# Patient Record
Sex: Female | Born: 1955 | Race: Black or African American | Hispanic: No | Marital: Single | Smoking: Never smoker
Health system: Southern US, Community
[De-identification: ages and names within clinical notes are randomized; demographics above are authoritative.]

## PROBLEM LIST (undated history)

## (undated) DIAGNOSIS — N2581 Secondary hyperparathyroidism of renal origin: Secondary | ICD-10-CM

## (undated) DIAGNOSIS — M1710 Unilateral primary osteoarthritis, unspecified knee: Secondary | ICD-10-CM

## (undated) DIAGNOSIS — R809 Proteinuria, unspecified: Secondary | ICD-10-CM

## (undated) DIAGNOSIS — N289 Disorder of kidney and ureter, unspecified: Secondary | ICD-10-CM

## (undated) DIAGNOSIS — Z992 Dependence on renal dialysis: Secondary | ICD-10-CM

## (undated) DIAGNOSIS — M1990 Unspecified osteoarthritis, unspecified site: Secondary | ICD-10-CM

## (undated) DIAGNOSIS — D631 Anemia in chronic kidney disease: Secondary | ICD-10-CM

## (undated) DIAGNOSIS — M2140 Flat foot [pes planus] (acquired), unspecified foot: Secondary | ICD-10-CM

## (undated) DIAGNOSIS — R7989 Other specified abnormal findings of blood chemistry: Secondary | ICD-10-CM

## (undated) DIAGNOSIS — M7062 Trochanteric bursitis, left hip: Secondary | ICD-10-CM

## (undated) DIAGNOSIS — E785 Hyperlipidemia, unspecified: Secondary | ICD-10-CM

## (undated) DIAGNOSIS — Z8614 Personal history of Methicillin resistant Staphylococcus aureus infection: Secondary | ICD-10-CM

## (undated) DIAGNOSIS — N186 End stage renal disease: Secondary | ICD-10-CM

## (undated) DIAGNOSIS — A4150 Gram-negative sepsis, unspecified: Secondary | ICD-10-CM

## (undated) DIAGNOSIS — G4730 Sleep apnea, unspecified: Secondary | ICD-10-CM

## (undated) DIAGNOSIS — E871 Hypo-osmolality and hyponatremia: Secondary | ICD-10-CM

## (undated) DIAGNOSIS — E559 Vitamin D deficiency, unspecified: Secondary | ICD-10-CM

## (undated) DIAGNOSIS — R011 Cardiac murmur, unspecified: Secondary | ICD-10-CM

## (undated) DIAGNOSIS — E119 Type 2 diabetes mellitus without complications: Secondary | ICD-10-CM

## (undated) DIAGNOSIS — M47816 Spondylosis without myelopathy or radiculopathy, lumbar region: Secondary | ICD-10-CM

## (undated) DIAGNOSIS — N390 Urinary tract infection, site not specified: Secondary | ICD-10-CM

## (undated) DIAGNOSIS — I10 Essential (primary) hypertension: Secondary | ICD-10-CM

## (undated) DIAGNOSIS — Q741 Congenital malformation of knee: Secondary | ICD-10-CM

## (undated) DIAGNOSIS — J45909 Unspecified asthma, uncomplicated: Secondary | ICD-10-CM

## (undated) DIAGNOSIS — R768 Other specified abnormal immunological findings in serum: Secondary | ICD-10-CM

## (undated) DIAGNOSIS — J309 Allergic rhinitis, unspecified: Secondary | ICD-10-CM

## (undated) DIAGNOSIS — E66811 Obesity, class 1: Secondary | ICD-10-CM

## (undated) DIAGNOSIS — M7061 Trochanteric bursitis, right hip: Secondary | ICD-10-CM

## (undated) DIAGNOSIS — I120 Hypertensive chronic kidney disease with stage 5 chronic kidney disease or end stage renal disease: Secondary | ICD-10-CM

## (undated) DIAGNOSIS — K219 Gastro-esophageal reflux disease without esophagitis: Secondary | ICD-10-CM

## (undated) DIAGNOSIS — J189 Pneumonia, unspecified organism: Secondary | ICD-10-CM

## (undated) DIAGNOSIS — K562 Volvulus: Secondary | ICD-10-CM

## (undated) HISTORY — PX: ABDOMINAL HYSTERECTOMY: SHX81

## (undated) HISTORY — PX: COLONOSCOPY: SHX174

## (undated) HISTORY — PX: NASAL FRACTURE SURGERY: SHX718

## (undated) HISTORY — PX: EXPLORATORY LAPAROTOMY: SUR591

## (undated) SURGERY — DIALYSIS/PERMA CATHETER INSERTION
Anesthesia: Moderate Sedation

---

## 1994-02-07 HISTORY — PX: BREAST BIOPSY: SHX20

## 2005-01-03 ENCOUNTER — Emergency Department: Payer: Self-pay | Admitting: Emergency Medicine

## 2005-10-13 ENCOUNTER — Emergency Department: Payer: Self-pay | Admitting: Unknown Physician Specialty

## 2006-11-22 ENCOUNTER — Other Ambulatory Visit: Payer: Self-pay

## 2006-11-22 ENCOUNTER — Inpatient Hospital Stay: Payer: Self-pay | Admitting: Surgery

## 2006-12-10 ENCOUNTER — Emergency Department: Payer: Self-pay | Admitting: Emergency Medicine

## 2006-12-12 ENCOUNTER — Ambulatory Visit: Payer: Self-pay | Admitting: Family Medicine

## 2008-07-02 ENCOUNTER — Ambulatory Visit: Payer: Self-pay | Admitting: Family Medicine

## 2009-03-04 ENCOUNTER — Ambulatory Visit: Payer: Self-pay | Admitting: Gastroenterology

## 2009-07-06 ENCOUNTER — Ambulatory Visit: Payer: Self-pay | Admitting: Family Medicine

## 2010-07-08 ENCOUNTER — Ambulatory Visit: Payer: Self-pay | Admitting: Family Medicine

## 2012-03-15 DIAGNOSIS — N1831 Chronic kidney disease, stage 3a: Secondary | ICD-10-CM | POA: Insufficient documentation

## 2012-03-15 DIAGNOSIS — M7061 Trochanteric bursitis, right hip: Secondary | ICD-10-CM | POA: Insufficient documentation

## 2012-03-15 DIAGNOSIS — M2140 Flat foot [pes planus] (acquired), unspecified foot: Secondary | ICD-10-CM | POA: Insufficient documentation

## 2012-03-15 DIAGNOSIS — I10 Essential (primary) hypertension: Secondary | ICD-10-CM | POA: Insufficient documentation

## 2012-03-15 DIAGNOSIS — J309 Allergic rhinitis, unspecified: Secondary | ICD-10-CM | POA: Insufficient documentation

## 2012-09-19 ENCOUNTER — Ambulatory Visit: Payer: Self-pay | Admitting: Family Medicine

## 2013-04-02 DIAGNOSIS — Q741 Congenital malformation of knee: Secondary | ICD-10-CM | POA: Insufficient documentation

## 2013-04-02 DIAGNOSIS — R809 Proteinuria, unspecified: Secondary | ICD-10-CM | POA: Insufficient documentation

## 2013-05-09 ENCOUNTER — Ambulatory Visit: Payer: Self-pay | Admitting: Family Medicine

## 2013-07-03 DIAGNOSIS — L209 Atopic dermatitis, unspecified: Secondary | ICD-10-CM | POA: Insufficient documentation

## 2014-01-19 DIAGNOSIS — N183 Chronic kidney disease, stage 3 unspecified: Secondary | ICD-10-CM | POA: Insufficient documentation

## 2014-02-07 HISTORY — PX: BREAST EXCISIONAL BIOPSY: SUR124

## 2014-03-21 DIAGNOSIS — E349 Endocrine disorder, unspecified: Secondary | ICD-10-CM | POA: Insufficient documentation

## 2014-04-07 ENCOUNTER — Ambulatory Visit: Payer: Self-pay | Admitting: Gastroenterology

## 2014-06-02 LAB — SURGICAL PATHOLOGY

## 2014-06-10 DIAGNOSIS — M47816 Spondylosis without myelopathy or radiculopathy, lumbar region: Secondary | ICD-10-CM | POA: Insufficient documentation

## 2014-06-10 DIAGNOSIS — M47818 Spondylosis without myelopathy or radiculopathy, sacral and sacrococcygeal region: Secondary | ICD-10-CM | POA: Insufficient documentation

## 2014-07-24 HISTORY — PX: BREAST BIOPSY: SHX20

## 2014-09-03 ENCOUNTER — Encounter: Payer: Self-pay | Admitting: *Deleted

## 2014-09-05 DIAGNOSIS — N63 Unspecified lump in unspecified breast: Secondary | ICD-10-CM | POA: Insufficient documentation

## 2014-09-05 HISTORY — DX: Unspecified lump in unspecified breast: N63.0

## 2014-09-08 ENCOUNTER — Ambulatory Visit: Payer: Self-pay | Admitting: General Surgery

## 2014-10-15 ENCOUNTER — Encounter: Payer: Self-pay | Admitting: *Deleted

## 2015-01-08 ENCOUNTER — Emergency Department
Admission: EM | Admit: 2015-01-08 | Discharge: 2015-01-08 | Disposition: A | Discharge status: Home / Self Care | Payer: BLUE CROSS/BLUE SHIELD | Attending: Emergency Medicine | Admitting: Emergency Medicine

## 2015-01-08 DIAGNOSIS — R0981 Nasal congestion: Secondary | ICD-10-CM

## 2015-01-08 DIAGNOSIS — R509 Fever, unspecified: Secondary | ICD-10-CM | POA: Diagnosis not present

## 2015-01-08 DIAGNOSIS — R059 Cough, unspecified: Secondary | ICD-10-CM

## 2015-01-08 DIAGNOSIS — R05 Cough: Secondary | ICD-10-CM | POA: Insufficient documentation

## 2015-01-08 MED ORDER — PROMETHAZINE-PHENYLEPHRINE 6.25-5 MG/5ML PO SYRP: 1.2500 mL | ORAL_SOLUTION | ORAL | Status: DC | PRN

## 2015-01-08 MED ORDER — AMOXICILLIN 250 MG/5ML PO SUSR: 250.0000 mg | Freq: Three times a day (TID) | ORAL | Status: DC

## 2015-01-08 MED ORDER — ACETAMINOPHEN 325 MG PO TABS
650.0000 mg | ORAL_TABLET | Freq: Once | ORAL | Status: AC
Start: 2015-01-08 — End: 2015-01-08
  Administered 2015-01-08: 650 mg via ORAL

## 2015-01-08 MED ORDER — ACETAMINOPHEN 325 MG PO TABS
ORAL_TABLET | ORAL | Status: AC
  Filled 2015-01-08: qty 2

## 2015-01-08 MED ORDER — AMOXICILLIN 500 MG PO CAPS: 500.0000 mg | ORAL_CAPSULE | Freq: Three times a day (TID) | ORAL | Status: DC

## 2015-01-08 MED ORDER — AMOXICILLIN 250 MG/5ML PO SUSR
250.0000 mg | Freq: Once | ORAL | Status: AC
  Administered 2015-01-08: 250 mg via ORAL
  Filled 2015-01-08: qty 5

## 2015-01-08 MED ORDER — HYDROCOD POLST-CPM POLST ER 10-8 MG/5ML PO SUER
5.0000 mL | Freq: Two times a day (BID) | ORAL | Status: DC
Start: 2015-01-08 — End: 2016-02-20

## 2015-01-08 MED ORDER — IBUPROFEN 800 MG PO TABS: 800.0000 mg | ORAL_TABLET | Freq: Once | ORAL | Status: DC

## 2015-01-08 MED ORDER — HYDROCOD POLST-CPM POLST ER 10-8 MG/5ML PO SUER
5.0000 mL | Freq: Once | ORAL | Status: AC
  Administered 2015-01-08: 5 mL via ORAL
  Filled 2015-01-08: qty 5

## 2015-01-08 NOTE — ED Notes (Signed)
Cough x 3 days, no fever, chills, and bodyaches.

## 2015-01-08 NOTE — Discharge Instructions (Signed)
Take medication as directed and follow-up with family doctor in 2-3 days if no improvement. Cough, Adult A cough helps to clear your throat and lungs. A cough may last only 2-3 weeks (acute), or it may last longer than 8 weeks (chronic). Many different things can cause a cough. A cough may be a sign of an illness or another medical condition. HOME CARE  Pay attention to any changes in your cough.  Take medicines only as told by your doctor.  If you were prescribed an antibiotic medicine, take it as told by your doctor. Do not stop taking it even if you start to feel better.  Talk with your doctor before you try using a cough medicine.  Drink enough fluid to keep your pee (urine) clear or pale yellow.  If the air is dry, use a cold steam vaporizer or humidifier in your home.  Stay away from things that make you cough at work or at home.  If your cough is worse at night, try using extra pillows to raise your head up higher while you sleep.  Do not smoke, and try not to be around smoke. If you need help quitting, ask your doctor.  Do not have caffeine.  Do not drink alcohol.  Rest as needed. GET HELP IF:  You have new problems (symptoms).  You cough up yellow fluid (pus).  Your cough does not get better after 2-3 weeks, or your cough gets worse.  Medicine does not help your cough and you are not sleeping well.  You have pain that gets worse or pain that is not helped with medicine.  You have a fever.  You are losing weight and you do not know why.  You have night sweats. GET HELP RIGHT AWAY IF:  You cough up blood.  You have trouble breathing.  Your heartbeat is very fast.   This information is not intended to replace advice given to you by your health care provider. Make sure you discuss any questions you have with your health care provider.   Document Released: 10/07/2010 Document Revised: 10/15/2014 Document Reviewed: 04/02/2014 Elsevier Interactive Patient  Education [employer].

## 2015-01-08 NOTE — ED Provider Notes (Signed)
[facility] Emergency Department Provider Note  ____________________________________________  Time seen: Approximately 7:52 PM  I have reviewed the triage vital signs and the nursing notes.   HISTORY  Chief Complaint Cough    HPI Heidi [name] is a 59 y.o. female patient has had a cough, nasal congestion, chills, fever and body aches for 3 days.Patient denies nausea vomiting diarrhea. Patient stated no relief over-the-counter medications. Patient rates the pain discomfort as 8/10.   No past medical history on file.  There are no active problems to display for this patient.   No past surgical history on file.  Current Outpatient Rx  Name  Route  Sig  Dispense  Refill  . amoxicillin (AMOXIL) 500 MG capsule   Oral   Take 1 capsule (500 mg total) by mouth 3 (three) times daily.   30 capsule   0   . chlorpheniramine-HYDROcodone (TUSSIONEX PENNKINETIC ER) 10-8 MG/5ML SUER   Oral   Take 5 mLs by mouth 2 (two) times daily.   115 mL   0   . promethazine-phenylephrine (PROMETHAZINE VC) 6.25-5 MG/5ML SYRP   Oral   Take 1.25 mLs by mouth every 4 (four) hours as needed for congestion.   30 mL   0     Allergies Sulfa antibiotics  No family history on file.  Social History Social History  Substance Use Topics  . Smoking status: Not on file  . Smokeless tobacco: Not on file  . Alcohol Use: Not on file    Review of Systems Constitutional: Fever and chills Eyes: No visual changes. ENT: No sore throat. Cardiovascular: Denies chest pain. Respiratory: Denies shortness of breath secondary to coughing spells. Gastrointestinal: No abdominal pain.  No nausea, no vomiting.  No diarrhea.  No constipation. Genitourinary: Negative for dysuria. Musculoskeletal: Negative for back pain. Skin: Negative for rash. Neurological: Negative for headaches, focal weakness or numbness. 10-point ROS otherwise  negative.  ____________________________________________   PHYSICAL EXAM:  VITAL SIGNS: ED Triage Vitals  Enc Vitals Group     BP 01/08/15 1918 154/84 mmHg     Pulse Rate 01/08/15 1918 94     Resp 01/08/15 1918 18     Temp 01/08/15 1918 99 F (37.2 C)     Temp src --      SpO2 01/08/15 1918 94 %     Weight 01/08/15 1918 208 lb (94.348 kg)     Height 01/08/15 1918 5\' 4"  (1.626 m)     Head Cir --      Peak Flow --      Pain Score 01/08/15 1918 8     Pain Loc --      Pain Edu? --      Excl. in [place]? --    Constitutional: Alert and oriented. Appears malaise. Eyes: Conjunctivae are normal. PERRL. EOMI. Head: Atraumatic. Nose: Bilateral maxillary guarding, edematous nasal turbinates yellowish rhinorrhea. Mouth/Throat: Mucous membranes are moist.  Oropharynx non-erythematous. Neck: No stridor. No cervical spine tenderness to palpation. Hematological/Lymphatic/Immunilogical: No cervical lymphadenopathy. Cardiovascular: Normal rate, regular rhythm. Grossly normal heart sounds.  Good peripheral circulation. Respiratory: Normal respiratory effort.  No retractions. Lungs CTAB. Nonproductive cough Gastrointestinal: Soft and nontender. No distention. No abdominal bruits. No CVA tenderness. Musculoskeletal: No lower extremity tenderness nor edema.  No joint effusions. Neurologic:  Normal speech and language. No gross focal neurologic deficits are appreciated. No gait instability. Skin:  Skin is warm, dry and intact. No rash noted. Psychiatric: Mood and affect are normal. Speech  and behavior are normal.  ____________________________________________   LABS (all labs ordered are listed, but only abnormal results are displayed)  Labs Reviewed - No data to display ____________________________________________  EKG   ____________________________________________  RADIOLOGY   ____________________________________________   PROCEDURES  Procedure(s) performed: None  Critical Care  performed: No  ____________________________________________   INITIAL IMPRESSION / ASSESSMENT AND PLAN / ED COURSE  Pertinent labs & imaging results that were available during my care of the patient were reviewed by me and considered in my medical decision making (see chart for details).  Sinus congestion and nonproductive cough. ____________________________________________   FINAL CLINICAL IMPRESSION(S) / ED DIAGNOSES  Final diagnoses:  Nasal sinus congestion  Cough      Sable Feil, PA-C 01/08/15 2017  Nena Polio, MD 01/09/15 [phone]

## 2015-07-29 DIAGNOSIS — N2581 Secondary hyperparathyroidism of renal origin: Secondary | ICD-10-CM | POA: Insufficient documentation

## 2015-11-06 ENCOUNTER — Inpatient Hospital Stay
Admission: RE | Admit: 2015-11-06 | Discharge: 2015-11-06 | Disposition: A | Discharge status: Home / Self Care | Payer: Self-pay | Source: Ambulatory Visit | Attending: *Deleted | Admitting: *Deleted

## 2015-11-06 ENCOUNTER — Other Ambulatory Visit: Payer: Self-pay | Admitting: *Deleted

## 2015-11-06 DIAGNOSIS — Z9289 Personal history of other medical treatment: Secondary | ICD-10-CM

## 2015-11-09 ENCOUNTER — Other Ambulatory Visit: Payer: Self-pay | Admitting: Family Medicine

## 2015-11-09 DIAGNOSIS — Z1231 Encounter for screening mammogram for malignant neoplasm of breast: Secondary | ICD-10-CM

## 2015-12-15 ENCOUNTER — Ambulatory Visit
Admission: RE | Admit: 2015-12-15 | Discharge: 2015-12-15 | Disposition: A | Discharge status: Home / Self Care | Payer: BLUE CROSS/BLUE SHIELD | Source: Ambulatory Visit | Attending: Family Medicine | Admitting: Family Medicine

## 2015-12-15 DIAGNOSIS — Z1231 Encounter for screening mammogram for malignant neoplasm of breast: Secondary | ICD-10-CM | POA: Diagnosis present

## 2016-02-20 ENCOUNTER — Encounter: Payer: Self-pay | Admitting: Emergency Medicine

## 2016-02-20 ENCOUNTER — Emergency Department
Admission: EM | Admit: 2016-02-20 | Discharge: 2016-02-20 | Disposition: A | Discharge status: Home / Self Care | Payer: BLUE CROSS/BLUE SHIELD | Attending: Emergency Medicine | Admitting: Emergency Medicine

## 2016-02-20 DIAGNOSIS — R05 Cough: Secondary | ICD-10-CM | POA: Diagnosis present

## 2016-02-20 DIAGNOSIS — J069 Acute upper respiratory infection, unspecified: Secondary | ICD-10-CM | POA: Diagnosis not present

## 2016-02-20 MED ORDER — BENZONATATE 100 MG PO CAPS: 200.0000 mg | ORAL_CAPSULE | Freq: Three times a day (TID) | ORAL | 0 refills | Status: AC | PRN

## 2016-02-20 NOTE — ED Notes (Signed)
See triage note  States she developed cough and sore throat about 4 days ago  Unsure of fever  But pt is afebrile on arrival to ED

## 2016-02-20 NOTE — Discharge Instructions (Signed)
Follow-up with your primary doctor if any continued problems. Tylenol or ibuprofen as needed for body aches, fever, sore throat. Increase fluids. Tessalon 2 every 8 hours as needed for cough.

## 2016-02-20 NOTE — ED Triage Notes (Signed)
Pt to ed with c/o cough, congestion and sore throat x 4 days.  Pt denies fever.

## 2016-02-20 NOTE — ED Notes (Signed)
Pt informed this RN she has not taken her morning dose of HTN meds, and will do so when she goes home. Provider made aware.

## 2016-02-20 NOTE — ED Provider Notes (Signed)
[facility] Emergency Department Provider Note   ____________________________________________   First MD Initiated Contact with Patient 02/20/16 1259     (approximate)  I have reviewed the triage vital signs and the nursing notes.   HISTORY  Chief Complaint Sore Throat and Cough   HPI Heidi [name] is a 61 y.o. female is here with complaint of nonproductive cough, congestion and sore throat for approximately 5 days. Patient states she is unaware of any actual fever but has "felt feverish". Patient has had a nonproductive cough for which cough drops have not helped. She also complains of throat pain. Patient complains that her cough is keeping her up at night. To her knowledge she has not been exposed to influenza. She denies having been a smoker. She rates her discomfort as it is an 8 out of 10.   History reviewed. No pertinent past medical history.  There are no active problems to display for this patient.   Past Surgical History:  Procedure Laterality Date  . 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 BREAST BIOPSY Right 1996   benign    Prior to Admission medications   Medication Sig Start Date End Date Taking? Authorizing Provider  benzonatate (TESSALON PERLES) 100 MG capsule Take 2 capsules (200 mg total) by mouth 3 (three) times daily as needed. 02/20/16 02/19/17  Johnn Hai, PA-C    Allergies Sulfa antibiotics  Family History  Problem Relation Age of Onset  . Breast cancer Maternal Aunt     Social History Social History  Substance Use Topics  . Smoking status: Never Smoker  . Smokeless tobacco: Never Used  . Alcohol use Yes    Review of Systems Constitutional: Subjective fever/chills Eyes: No visual changes. ENT: Positive sore throat. Cardiovascular: Denies chest pain. Respiratory: Denies shortness of breath. Gastrointestinal: No abdominal pain.  No nausea, no vomiting.  No diarrhea.   Musculoskeletal: Negative for  back pain. Skin: Negative for rash. Neurological: Negative for headaches, focal weakness or numbness.  10-point ROS otherwise negative.  ____________________________________________   PHYSICAL EXAM:  VITAL SIGNS: ED Triage Vitals [02/20/16 1215]  Enc Vitals Group     BP (!) 159/91     Pulse Rate 87     Resp 16     Temp 98.7 F (37.1 C)     Temp Source Oral     SpO2 100 %     Weight 208 lb (94.3 kg)     Height 5\' 4"  (1.626 m)     Head Circumference      Peak Flow      Pain Score 8     Pain Loc      Pain Edu?      Excl. in [place]?     Constitutional: Alert and oriented. Well appearing and in no acute distress. Eyes: Conjunctivae are normal. PERRL. EOMI. Head: Atraumatic.    Nose: Mild congestion/no rhinnorhea.  Nontender sinuses to percussion. Mouth/Throat: Mucous membranes are moist.  Oropharynx non-erythematous. Positive posterior drainage. Neck: No stridor.   Hematological/Lymphatic/Immunilogical: No cervical lymphadenopathy. Cardiovascular: Normal rate, regular rhythm. Grossly normal heart sounds.  Good peripheral circulation. Respiratory: Normal respiratory effort.  No retractions. Lungs CTAB. Musculoskeletal: Moves upper and lower extremities without difficulty. Normal gait was noted. Neurologic:  Normal speech and language. No gross focal neurologic deficits are appreciated. No gait instability. Skin:  Skin is warm, dry and intact. No rash noted. Psychiatric: Mood and affect are normal. Speech and behavior are normal.  ____________________________________________   LABS (all labs ordered are listed, but only abnormal results are displayed)  Labs Reviewed - No data to display  PROCEDURES  Procedure(s) performed: None  Procedures  Critical Care performed: No  ____________________________________________   INITIAL IMPRESSION / ASSESSMENT AND PLAN / ED COURSE  Pertinent labs & imaging results that were available during my care of the patient were reviewed  by me and considered in my medical decision making (see chart for details).    Clinical Course    Patient is follow-up with her primary care doctor if any continued problems. She is wears she is to increase fluids and also take Tylenol or ibuprofen if needed for fever or throat pain. She is given a prescription for Tessalon Perles 200 mg 3 times a day when necessary cough.  ____________________________________________   FINAL CLINICAL IMPRESSION(S) / ED DIAGNOSES  Final diagnoses:  Viral upper respiratory infection      NEW MEDICATIONS STARTED DURING THIS VISIT:  New Prescriptions   BENZONATATE (TESSALON PERLES) 100 MG CAPSULE    Take 2 capsules (200 mg total) by mouth 3 (three) times daily as needed.     Note:  This document was prepared using Dragon voice recognition software and may include unintentional dictation errors.    Johnn Hai, PA-C 02/20/16 Ewa Gentry, MD 02/20/16 1355

## 2016-04-25 ENCOUNTER — Encounter: Payer: Self-pay | Admitting: Intensive Care

## 2016-04-25 DIAGNOSIS — Z79899 Other long term (current) drug therapy: Secondary | ICD-10-CM | POA: Diagnosis not present

## 2016-04-25 DIAGNOSIS — I129 Hypertensive chronic kidney disease with stage 1 through stage 4 chronic kidney disease, or unspecified chronic kidney disease: Secondary | ICD-10-CM | POA: Insufficient documentation

## 2016-04-25 DIAGNOSIS — N183 Chronic kidney disease, stage 3 (moderate): Secondary | ICD-10-CM | POA: Insufficient documentation

## 2016-04-25 DIAGNOSIS — J45909 Unspecified asthma, uncomplicated: Secondary | ICD-10-CM | POA: Diagnosis not present

## 2016-04-25 DIAGNOSIS — E1122 Type 2 diabetes mellitus with diabetic chronic kidney disease: Secondary | ICD-10-CM | POA: Insufficient documentation

## 2016-04-25 DIAGNOSIS — E1165 Type 2 diabetes mellitus with hyperglycemia: Secondary | ICD-10-CM | POA: Diagnosis present

## 2016-04-25 LAB — URINALYSIS, COMPLETE (UACMP) WITH MICROSCOPIC
BACTERIA UA: NONE SEEN
Bilirubin Urine: NEGATIVE
Glucose, UA: >=500 mg/dL — AB
Hgb urine dipstick: NEGATIVE
Ketones, ur: NEGATIVE mg/dL
Leukocytes, UA: NEGATIVE
Nitrite: NEGATIVE
Protein, ur: >=300 mg/dL — AB
SPECIFIC GRAVITY, URINE: 1.015 (ref 1.005–1.030)
pH: 7 (ref 5.0–8.0)

## 2016-04-25 LAB — CBC
HEMATOCRIT: 38.3 % (ref 35.0–47.0)
HEMOGLOBIN: 12.6 g/dL (ref 12.0–16.0)
MCH: 27.7 pg (ref 26.0–34.0)
MCHC: 32.9 g/dL (ref 32.0–36.0)
MCV: 84.1 fL (ref 80.0–100.0)
Platelets: 290 10*3/uL (ref 150–440)
RBC: 4.56 MIL/uL (ref 3.80–5.20)
RDW: 14.7 % — AB (ref 11.5–14.5)
WBC: 8.8 10*3/uL (ref 3.6–11.0)

## 2016-04-25 LAB — BASIC METABOLIC PANEL
ANION GAP: 5 (ref 5–15)
BUN: 17 mg/dL (ref 6–20)
CO2: 25 mmol/L (ref 22–32)
Calcium: 8.1 mg/dL — ABNORMAL LOW (ref 8.9–10.3)
Chloride: 98 mmol/L — ABNORMAL LOW (ref 101–111)
Creatinine, Ser: 1.8 mg/dL — ABNORMAL HIGH (ref 0.44–1.00)
GFR calc Af Amer: 34 mL/min — ABNORMAL LOW (ref >60)
GFR calc non Af Amer: 30 mL/min — ABNORMAL LOW (ref >60)
GLUCOSE: 435 mg/dL — AB (ref 65–99)
POTASSIUM: 4.3 mmol/L (ref 3.5–5.1)
Sodium: 128 mmol/L — ABNORMAL LOW (ref 135–145)

## 2016-04-25 LAB — GLUCOSE, CAPILLARY: Glucose-Capillary: 424 mg/dL — ABNORMAL HIGH (ref 65–99)

## 2016-04-25 NOTE — ED Triage Notes (Addendum)
Patient was seen at PCP today and had labs drawn. Patient reports they called her about 30 minutes ago and told her to come to ER since her blood sugar was in 500s. Patient states "I feel fine right now but I am having some lower abdominal discomfort. I have pain when I am almost done urinating" Patient reports she has not had her insulin X1 month due to the pharmacy and doctors not filling her prescription. HX HTN, CKD stage 3, diabetes

## 2016-04-26 ENCOUNTER — Emergency Department
Admission: EM | Admit: 2016-04-26 | Discharge: 2016-04-26 | Disposition: A | Discharge status: Home / Self Care | Payer: BLUE CROSS/BLUE SHIELD | Attending: Emergency Medicine | Admitting: Emergency Medicine

## 2016-04-26 DIAGNOSIS — R739 Hyperglycemia, unspecified: Secondary | ICD-10-CM

## 2016-04-26 HISTORY — DX: Unspecified asthma, uncomplicated: J45.909

## 2016-04-26 HISTORY — DX: Essential (primary) hypertension: I10

## 2016-04-26 HISTORY — DX: Disorder of kidney and ureter, unspecified: N28.9

## 2016-04-26 HISTORY — DX: Type 2 diabetes mellitus without complications: E11.9

## 2016-04-26 LAB — GLUCOSE, CAPILLARY
Glucose-Capillary: 318 mg/dL — ABNORMAL HIGH (ref 65–99)
Glucose-Capillary: 266 mg/dL — ABNORMAL HIGH (ref 65–99)

## 2016-04-26 MED ORDER — INSULIN ASPART 100 UNIT/ML ~~LOC~~ SOLN
6.0000 [IU] | Freq: Once | SUBCUTANEOUS | Status: AC
  Administered 2016-04-26: 6 [IU] via INTRAVENOUS
  Filled 2016-04-26: qty 6

## 2016-04-26 MED ORDER — SODIUM CHLORIDE 0.9 % IV BOLUS (SEPSIS)
1000.0000 mL | Freq: Once | INTRAVENOUS | Status: AC
  Administered 2016-04-26: 1000 mL via INTRAVENOUS

## 2016-04-26 NOTE — Discharge Instructions (Signed)
Is unsure which were taking her diabetes medication and please follow-up with your primary care physician to ensure that she receive refills appropriately.

## 2016-04-26 NOTE — ED Provider Notes (Signed)
[facility] Emergency Department Provider Note   ____________________________________________   First MD Initiated Contact with Patient 04/26/16 0141     (approximate)  I have reviewed the triage vital signs and the nursing notes.   HISTORY  Chief Complaint Hyperglycemia    HPI Heidi [name] is a 61 y.o. female who comes into the hospital today because her sugar is elevated. The patient reports that she was sent over by her doctor because her blood sugars were over 500. She reports that she has been taking Januvia but has been out of her insulin and glipizide for the past month. The patient does not check her blood sugars regularly and had not been getting a prescription for her medications. The patient reports that she didn't get prescriptions today but thinks something may have been called in. The patient denies any chest pain or shortness of breath. She reports that she has been vomiting. The patient states that she's been vomiting all weekend and did have abdominal pain but that has all resolved. The patient reports it was about once a day although it was worse on Friday about 3 times. The patient denies any diarrhea. She also denies any pain currently. She's been able to eat and drink since vomiting earlier today. The patient is here today for evaluation and treatment of her high blood sugar.   Past Medical History:  Diagnosis Date  . Asthma   . Diabetes mellitus without complication ([place])   . Hypertension   . Renal disorder    CKD stage 3    There are no active problems to display for this patient.   Past Surgical History:  Procedure Laterality Date  . 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 BREAST BIOPSY Right 1996   benign    Prior to Admission medications   Medication Sig Start Date End Date Taking? Authorizing Provider  benzonatate (TESSALON PERLES) 100 MG capsule Take 2 capsules (200 mg total) by mouth 3 (three) times daily as  needed. 02/20/16 02/19/17  Johnn Hai, PA-C    Allergies Sulfa antibiotics  Family History  Problem Relation Age of Onset  . Breast cancer Maternal Aunt     Social History Social History  Substance Use Topics  . Smoking status: Never Smoker  . Smokeless tobacco: Never Used  . Alcohol use Yes     Comment: occ    Review of Systems Constitutional: No fever/chills Eyes: No visual changes. ENT: No sore throat. Cardiovascular: Denies chest pain. Respiratory: Denies shortness of breath. Gastrointestinal: Nausea and vomiting, No abdominal pain.  No diarrhea.  No constipation. Genitourinary: Negative for dysuria. Musculoskeletal: Negative for back pain. Skin: Negative for rash. Neurological: Negative for headaches, focal weakness or numbness.  10-point ROS otherwise negative.  ____________________________________________   PHYSICAL EXAM:  VITAL SIGNS: ED Triage Vitals  Enc Vitals Group     BP 04/25/16 2224 (!) 191/95     Pulse Rate 04/25/16 2224 (!) 106     Resp 04/25/16 2224 18     Temp 04/25/16 2224 99.3 F (37.4 C)     Temp Source 04/25/16 2224 Oral     SpO2 04/25/16 2224 99 %     Weight 04/25/16 2227 190 lb (86.2 kg)     Height 04/25/16 2227 5\' 4"  (1.626 m)     Head Circumference --      Peak Flow --      Pain Score --      Pain Loc --  Pain Edu? --      Excl. in [place]? --     Constitutional: Alert and oriented. Well appearing and in no acute distress. Eyes: Conjunctivae are normal. PERRL. EOMI. Head: Atraumatic. Nose: No congestion/rhinnorhea. Mouth/Throat: Mucous membranes are moist.  Oropharynx non-erythematous. Cardiovascular: Normal rate, regular rhythm. Grossly normal heart sounds.  Good peripheral circulation. Respiratory: Normal respiratory effort.  No retractions. Lungs CTAB. Gastrointestinal: Soft and nontender. No distention.  Musculoskeletal: No lower extremity tenderness nor edema.   Neurologic:  Normal speech and language.  Skin:   Skin is warm, dry and intact.  Psychiatric: Mood and affect are normal.   ____________________________________________   LABS (all labs ordered are listed, but only abnormal results are displayed)  Labs Reviewed  GLUCOSE, CAPILLARY - Abnormal; Notable for the following:       Result Value   Glucose-Capillary 424 (*)    All other components within normal limits  BASIC METABOLIC PANEL - Abnormal; Notable for the following:    Sodium 128 (*)    Chloride 98 (*)    Glucose, Bld 435 (*)    Creatinine, Ser 1.80 (*)    Calcium 8.1 (*)    GFR calc non Af Amer 30 (*)    GFR calc Af Amer 34 (*)    All other components within normal limits  CBC - Abnormal; Notable for the following:    RDW 14.7 (*)    All other components within normal limits  URINALYSIS, COMPLETE (UACMP) WITH MICROSCOPIC - Abnormal; Notable for the following:    Color, Urine STRAW (*)    APPearance CLEAR (*)    Glucose, UA >=500 (*)    Protein, ur >=300 (*)    Squamous Epithelial / LPF 0-5 (*)    All other components within normal limits  GLUCOSE, CAPILLARY - Abnormal; Notable for the following:    Glucose-Capillary 318 (*)    All other components within normal limits  GLUCOSE, CAPILLARY - Abnormal; Notable for the following:    Glucose-Capillary 266 (*)    All other components within normal limits  URINE CULTURE  CBG MONITORING, ED  CBG MONITORING, ED   ____________________________________________  EKG  none ____________________________________________  RADIOLOGY  none ____________________________________________   PROCEDURES  Procedure(s) performed: None  Procedures  Critical Care performed: No  ____________________________________________   INITIAL IMPRESSION / ASSESSMENT AND PLAN / ED COURSE  Pertinent labs & imaging results that were available during my care of the patient were reviewed by me and considered in my medical decision making (see chart for details).  This is a 61 year old  female who comes into the hospital today elevated blood sugar. I will give the patient a liter of normal saline as well as some insulin. I will reassess the patient when she's receive her insulin and her fluids.  After a liter of normal saline and some insulin the patient's blood sugar is improved down to 266. Home to follow-up with her primary care physician. As been able to drink in the emergency department without any vomiting.      ____________________________________________   FINAL CLINICAL IMPRESSION(S) / ED DIAGNOSES  Final diagnoses:  Hyperglycemia      NEW MEDICATIONS STARTED DURING THIS VISIT:  New Prescriptions   No medications on file     Note:  This document was prepared using Dragon voice recognition software and may include unintentional dictation errors.    Loney Hering, MD 04/26/16 [phone]

## 2016-04-27 LAB — URINE CULTURE

## 2017-04-18 ENCOUNTER — Other Ambulatory Visit: Payer: Self-pay | Admitting: Nephrology

## 2017-04-18 DIAGNOSIS — Q6102 Congenital multiple renal cysts: Secondary | ICD-10-CM

## 2017-04-27 ENCOUNTER — Ambulatory Visit
Admission: RE | Admit: 2017-04-27 | Discharge: 2017-04-27 | Disposition: A | Discharge status: Home / Self Care | Payer: BLUE CROSS/BLUE SHIELD | Source: Ambulatory Visit | Attending: Nephrology | Admitting: Nephrology

## 2017-04-27 DIAGNOSIS — M5136 Other intervertebral disc degeneration, lumbar region: Secondary | ICD-10-CM | POA: Insufficient documentation

## 2017-04-27 DIAGNOSIS — K381 Appendicular concretions: Secondary | ICD-10-CM | POA: Diagnosis not present

## 2017-04-27 DIAGNOSIS — I700 Atherosclerosis of aorta: Secondary | ICD-10-CM | POA: Diagnosis not present

## 2017-04-27 DIAGNOSIS — Q6102 Congenital multiple renal cysts: Secondary | ICD-10-CM

## 2017-04-27 DIAGNOSIS — N281 Cyst of kidney, acquired: Secondary | ICD-10-CM | POA: Insufficient documentation

## 2017-07-27 ENCOUNTER — Encounter: Payer: Self-pay | Admitting: Urology

## 2017-07-27 ENCOUNTER — Ambulatory Visit: Payer: BLUE CROSS/BLUE SHIELD | Admitting: Urology

## 2017-07-27 VITALS — BP 144/85 | HR 91 | Resp 16 | Ht 64.0 in | Wt 210.0 lb

## 2017-07-27 DIAGNOSIS — N281 Cyst of kidney, acquired: Secondary | ICD-10-CM

## 2017-07-27 LAB — MICROSCOPIC EXAMINATION: RBC, UA: NONE SEEN /hpf (ref 0–2)

## 2017-07-27 LAB — URINALYSIS, COMPLETE
Bilirubin, UA: NEGATIVE
Glucose, UA: NEGATIVE
LEUKOCYTES UA: NEGATIVE
Nitrite, UA: NEGATIVE
PH UA: 5.5 (ref 5.0–7.5)
RBC, UA: NEGATIVE
SPEC GRAV UA: 1.02 (ref 1.005–1.030)
Urobilinogen, Ur: 0.2 mg/dL (ref 0.2–1.0)

## 2017-07-27 NOTE — Progress Notes (Signed)
07/27/2017 3:42 PM   Heidi [name] [birth year] [id number]  Referring provider: Sharyne Peach, MD [address] [place], [place] [address]  Chief Complaint  Patient presents with  . Other    HPI: Heidi [name] is a 62 year old female seen in consultation at the request of Dr. Holley Raring for evaluation of renal cysts.  She is followed for chronic kidney disease.  A renal ultrasound showed renal cysts and a noncontrast CT scan of the abdomen and pelvis was performed in March 2019.  This showed a septal intermediate density lesion in the right kidney and multiple left renal cysts some with intermediate to high density.  She has no bothersome lower urinary tract symptoms.  She denies gross hematuria.  Denies flank, abdominal, pelvic pain.  There is no previous history of urologic problems.   PMH: Past Medical History:  Diagnosis Date  . Asthma   . Diabetes mellitus without complication ([place])   . Hypertension   . Renal disorder    CKD stage 3    Surgical History: Past Surgical History:  Procedure Laterality Date  . 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 BREAST BIOPSY Right 1996   benign    Home Medications:  Allergies as of 07/27/2017      Reactions   Sulfa Antibiotics Nausea And Vomiting      Medication List        Accurate as of 07/27/17  3:42 PM. Always use your most recent med list.          amLODipine 10 MG tablet Commonly known as:  NORVASC Take by mouth.   aspirin EC 81 MG tablet Take by mouth.   JANUVIA 100 MG tablet Generic drug:  sitaGLIPtin TK 1 T PO ONCE D   losartan 100 MG tablet Commonly known as:  COZAAR Take by mouth.   lovastatin 20 MG tablet Commonly known as:  MEVACOR Take by mouth.   metoprolol tartrate 100 MG tablet Commonly known as:  LOPRESSOR Take by mouth.   NOVOLIN N 100 UNIT/ML injection Generic drug:  insulin NPH Human INJ 80 UNITS [place] QAM AND 10 UNITS QPM OR UTD       Allergies:  Allergies  Allergen Reactions  .  Sulfa Antibiotics Nausea And Vomiting    Family History: Family History  Problem Relation Age of Onset  . Breast cancer Maternal Aunt     Social History:  reports that she has never smoked. She has never used smokeless tobacco. She reports that she drinks alcohol. She reports that she does not use drugs.  ROS: UROLOGY Frequent Urination?: No Hard to postpone urination?: No Burning/pain with urination?: No Get up at night to urinate?: Yes Leakage of urine?: No Urine stream starts and stops?: No Trouble starting stream?: No Do you have to strain to urinate?: No Blood in urine?: No Urinary tract infection?: No Sexually transmitted disease?: No Injury to kidneys or bladder?: No Painful intercourse?: No Weak stream?: No Currently pregnant?: No Vaginal bleeding?: No  Gastrointestinal Nausea?: No Vomiting?: No Indigestion/heartburn?: No Diarrhea?: No Constipation?: No  Constitutional Fever: No Night sweats?: No Weight loss?: No Fatigue?: No  Skin Skin rash/lesions?: No Itching?: No  Eyes Blurred vision?: No Double vision?: No  Ears/Nose/Throat Sore throat?: No Sinus problems?: No  Hematologic/Lymphatic Swollen glands?: No Easy bruising?: Yes  Cardiovascular Leg swelling?: No Chest pain?: No  Respiratory Cough?: Yes Shortness of breath?: No  Endocrine Excessive thirst?: No  Musculoskeletal Back pain?: Yes Joint pain?:  Yes  Neurological Headaches?: No Dizziness?: No  Psychologic Depression?: No Anxiety?: No  Physical Exam: BP (!) 144/85   Pulse 91   Resp 16   Ht 5\' 4"  (1.626 m)   Wt 210 lb (95.3 kg)   SpO2 96%   BMI 36.05 kg/m   Constitutional:  Alert and oriented, No acute distress. HEENT: [place] AT, moist mucus membranes.  Trachea midline, no masses. Cardiovascular: No clubbing, cyanosis, or edema. Respiratory: Normal respiratory effort, no increased work of breathing. GI: Abdomen is soft, nontender, nondistended, no abdominal  masses GU: No CVA tenderness Lymph: No cervical or inguinal lymphadenopathy. Skin: No rashes, bruises or suspicious lesions. Neurologic: Grossly intact, no focal deficits, moving all 4 extremities. Psychiatric: Normal mood and affect.  Laboratory Data: Lab Results  Component Value Date   WBC 8.8 04/25/2016   HGB 12.6 04/25/2016   HCT 38.3 04/25/2016   MCV 84.1 04/25/2016   PLT 290 04/25/2016    Lab Results  Component Value Date   CREATININE 1.80 (H) 04/25/2016    Pertinent Imaging: CT reviewed  Assessment & Plan:   62 year old female with indeterminate left renal lesions most likely proteinaceous cysts.  I recommended a MRI of the abdomen pelvis with and without contrast for further evaluation.   Abbie Sons, [place] [address], [place], [place] [address] [phone]

## 2017-07-28 ENCOUNTER — Encounter: Payer: Self-pay | Admitting: Urology

## 2017-08-18 DIAGNOSIS — M1710 Unilateral primary osteoarthritis, unspecified knee: Secondary | ICD-10-CM | POA: Insufficient documentation

## 2019-01-09 ENCOUNTER — Other Ambulatory Visit: Payer: Self-pay

## 2019-01-09 DIAGNOSIS — Z20822 Contact with and (suspected) exposure to covid-19: Secondary | ICD-10-CM

## 2019-01-11 LAB — NOVEL CORONAVIRUS, NAA: SARS-CoV-2, NAA: DETECTED — AB

## 2019-01-12 ENCOUNTER — Other Ambulatory Visit: Payer: Self-pay | Admitting: Nurse Practitioner

## 2019-01-12 ENCOUNTER — Telehealth: Payer: Self-pay | Admitting: Nurse Practitioner

## 2019-01-12 DIAGNOSIS — U071 COVID-19: Secondary | ICD-10-CM | POA: Insufficient documentation

## 2019-01-12 DIAGNOSIS — E669 Obesity, unspecified: Secondary | ICD-10-CM | POA: Insufficient documentation

## 2019-01-12 DIAGNOSIS — Z6835 Body mass index (BMI) 35.0-35.9, adult: Secondary | ICD-10-CM | POA: Insufficient documentation

## 2019-01-12 NOTE — Progress Notes (Signed)
Bam orders for 01/15/19

## 2019-01-12 NOTE — Telephone Encounter (Signed)
Appointment made for 1PM Tuesday 12/8.

## 2019-01-12 NOTE — Telephone Encounter (Signed)
  I connected by phone with Heidi [name] on 01/12/2019 at 11:43 AM to discuss the potential use of an new treatment for mild to moderate COVID-19 viral infection in non-hospitalized patients.  She has had symptoms for 4 days and tested positive 01/09/19.  This patient is a 63 y.o. female that meets the FDA criteria for Emergency Use Authorization of bamlanivimab or casirivimab\imdevimab.  Has a (+) direct SARS-CoV-2 viral test result  Has mild or moderate COVID-19   Is ? 63 years of age and weighs ? 40 kg  Is NOT hospitalized due to COVID-19  Is NOT requiring oxygen therapy or requiring an increase in baseline oxygen flow rate due to COVID-19  Is within 10 days of symptom onset  Has at least one of the high risk factor(s) for progression to severe COVID-19 and/or hospitalization as defined in EUA.  Specific high risk criteria : > 55 with T2DM, obesity, CKD which are managed by her PCP  I have spoken and communicated the following to the patient or parent/caregiver:  1. FDA has authorized the emergency use of bamlanivimab for the treatment of mild to moderate COVID-19 in adults and pediatric patients with positive results of direct SARS-CoV-2 viral testing who are 71 years of age and older weighing at least 40 kg, and who are at high risk for progressing to severe COVID-19 and/or hospitalization.  2. The significant known and potential risks and benefits of bamlanivimab, and the extent to which such potential risks and benefits are unknown.  3. Information on available alternative treatments and the risks and benefits of those alternatives, including clinical trials.  4. Patients treated with bamlanivimab should continue to self-isolate and use infection control measures (e.g., wear mask, isolate, social distance, avoid sharing personal items, clean and disinfect "high touch" surfaces, and frequent handwashing) according to CDC guidelines.   5. The patient or parent/caregiver has the  option to accept or refuse bamlanivimab.  After reviewing this information with the patient, The patient agreed to proceed with receiving the infusion of bamlanivimab and will be provided a copy of the Fact sheet prior to receiving the infusion.[name] 01/12/2019 11:43 AM

## 2019-01-15 ENCOUNTER — Encounter (HOSPITAL_COMMUNITY): Payer: Self-pay

## 2019-01-15 ENCOUNTER — Ambulatory Visit (HOSPITAL_COMMUNITY)
Admission: RE | Admit: 2019-01-15 | Discharge: 2019-01-15 | Disposition: A | Discharge status: Home / Self Care | Payer: BC Managed Care – PPO | Source: Ambulatory Visit | Attending: Pulmonary Disease | Admitting: Pulmonary Disease

## 2019-01-15 DIAGNOSIS — U071 COVID-19: Secondary | ICD-10-CM | POA: Diagnosis not present

## 2019-01-15 MED ORDER — SODIUM CHLORIDE 0.9 % IV SOLN
700.0000 mg | Freq: Once | INTRAVENOUS | Status: AC
  Administered 2019-01-15: 700 mg via INTRAVENOUS
  Filled 2019-01-15: qty 20

## 2019-01-15 MED ORDER — FAMOTIDINE IN NACL 20-0.9 MG/50ML-% IV SOLN: 20.0000 mg | Freq: Once | INTRAVENOUS | Status: DC | PRN

## 2019-01-15 MED ORDER — SODIUM CHLORIDE 0.9 % IV SOLN
INTRAVENOUS | Status: DC | PRN
  Administered 2019-01-15: 250 mL via INTRAVENOUS

## 2019-01-15 MED ORDER — METHYLPREDNISOLONE SODIUM SUCC 125 MG IJ SOLR: 125.0000 mg | Freq: Once | INTRAMUSCULAR | Status: DC | PRN

## 2019-01-15 MED ORDER — ALBUTEROL SULFATE HFA 108 (90 BASE) MCG/ACT IN AERS: 2.0000 | INHALATION_SPRAY | Freq: Once | RESPIRATORY_TRACT | Status: DC | PRN

## 2019-01-15 MED ORDER — EPINEPHRINE 0.3 MG/0.3ML IJ SOAJ: 0.3000 mg | Freq: Once | INTRAMUSCULAR | Status: DC | PRN

## 2019-01-15 MED ORDER — DIPHENHYDRAMINE HCL 50 MG/ML IJ SOLN: 50.0000 mg | Freq: Once | INTRAMUSCULAR | Status: DC | PRN

## 2019-01-15 NOTE — Progress Notes (Signed)
  Diagnosis: COVID-19  Physician: Dr. Salome Holmes  Procedure: [facility] Med: bamlanivimab infusion - Provided patient with bamlanimivab fact sheet for patients, parents and caregivers prior to infusion.  Complications: No immediate complications noted.  Discharge: Discharged home   [place] 01/15/2019

## 2019-11-14 ENCOUNTER — Other Ambulatory Visit: Admission: RE | Admit: 2019-11-14 | Payer: 59 | Source: Ambulatory Visit

## 2019-12-17 ENCOUNTER — Other Ambulatory Visit
Admission: RE | Admit: 2019-12-17 | Discharge: 2019-12-17 | Disposition: A | Discharge status: Home / Self Care | Payer: 59 | Source: Ambulatory Visit | Attending: Internal Medicine | Admitting: Internal Medicine

## 2019-12-17 ENCOUNTER — Other Ambulatory Visit: Payer: Self-pay

## 2019-12-17 DIAGNOSIS — Z01812 Encounter for preprocedural laboratory examination: Secondary | ICD-10-CM | POA: Insufficient documentation

## 2019-12-17 DIAGNOSIS — Z20822 Contact with and (suspected) exposure to covid-19: Secondary | ICD-10-CM | POA: Insufficient documentation

## 2019-12-18 ENCOUNTER — Encounter: Payer: Self-pay | Admitting: Internal Medicine

## 2019-12-18 LAB — SARS CORONAVIRUS 2 (TAT 6-24 HRS): SARS Coronavirus 2: NEGATIVE

## 2019-12-19 ENCOUNTER — Ambulatory Visit: Payer: 59 | Admitting: Anesthesiology

## 2019-12-19 ENCOUNTER — Encounter: Payer: Self-pay | Admitting: Internal Medicine

## 2019-12-19 ENCOUNTER — Other Ambulatory Visit: Payer: Self-pay

## 2019-12-19 ENCOUNTER — Ambulatory Visit
Admission: RE | Admit: 2019-12-19 | Discharge: 2019-12-19 | Disposition: A | Discharge status: Home / Self Care | Payer: 59 | Attending: Internal Medicine | Admitting: Internal Medicine

## 2019-12-19 ENCOUNTER — Encounter
Admission: RE | Disposition: A | Discharge status: Home / Self Care | Payer: Self-pay | Source: Home / Self Care | Attending: Internal Medicine

## 2019-12-19 DIAGNOSIS — Z1211 Encounter for screening for malignant neoplasm of colon: Secondary | ICD-10-CM | POA: Insufficient documentation

## 2019-12-19 DIAGNOSIS — Z86010 Personal history of colonic polyps: Secondary | ICD-10-CM | POA: Insufficient documentation

## 2019-12-19 DIAGNOSIS — K5730 Diverticulosis of large intestine without perforation or abscess without bleeding: Secondary | ICD-10-CM | POA: Insufficient documentation

## 2019-12-19 DIAGNOSIS — Z7982 Long term (current) use of aspirin: Secondary | ICD-10-CM | POA: Diagnosis not present

## 2019-12-19 DIAGNOSIS — K640 First degree hemorrhoids: Secondary | ICD-10-CM | POA: Insufficient documentation

## 2019-12-19 DIAGNOSIS — Z79899 Other long term (current) drug therapy: Secondary | ICD-10-CM | POA: Diagnosis not present

## 2019-12-19 DIAGNOSIS — Z794 Long term (current) use of insulin: Secondary | ICD-10-CM | POA: Insufficient documentation

## 2019-12-19 HISTORY — DX: Trochanteric bursitis, left hip: M70.62

## 2019-12-19 HISTORY — DX: Congenital malformation of knee: Q74.1

## 2019-12-19 HISTORY — DX: Unspecified osteoarthritis, unspecified site: M19.90

## 2019-12-19 HISTORY — DX: Proteinuria, unspecified: R80.9

## 2019-12-19 HISTORY — DX: Volvulus: K56.2

## 2019-12-19 HISTORY — DX: Hypo-osmolality and hyponatremia: E87.1

## 2019-12-19 HISTORY — DX: Allergic rhinitis, unspecified: J30.9

## 2019-12-19 HISTORY — PX: COLONOSCOPY: SHX5424

## 2019-12-19 HISTORY — DX: Flat foot (pes planus) (acquired), unspecified foot: M21.40

## 2019-12-19 HISTORY — DX: Unilateral primary osteoarthritis, unspecified knee: M17.10

## 2019-12-19 HISTORY — DX: Gastro-esophageal reflux disease without esophagitis: K21.9

## 2019-12-19 HISTORY — DX: Trochanteric bursitis, right hip: M70.61

## 2019-12-19 HISTORY — DX: Spondylosis without myelopathy or radiculopathy, lumbar region: M47.816

## 2019-12-19 HISTORY — DX: Hyperlipidemia, unspecified: E78.5

## 2019-12-19 LAB — GLUCOSE, CAPILLARY: Glucose-Capillary: 123 mg/dL — ABNORMAL HIGH (ref 70–99)

## 2019-12-19 SURGERY — COLONOSCOPY
Anesthesia: General

## 2019-12-19 MED ORDER — SODIUM CHLORIDE 0.9 % IV SOLN: INTRAVENOUS | Status: DC

## 2019-12-19 MED ORDER — PROPOFOL 10 MG/ML IV BOLUS
INTRAVENOUS | Status: DC | PRN
  Administered 2019-12-19: 100 mg via INTRAVENOUS

## 2019-12-19 MED ORDER — PROPOFOL 500 MG/50ML IV EMUL
INTRAVENOUS | Status: DC | PRN
  Administered 2019-12-19: 120 ug/kg/min via INTRAVENOUS

## 2019-12-19 MED ORDER — LIDOCAINE HCL (CARDIAC) PF 100 MG/5ML IV SOSY
PREFILLED_SYRINGE | INTRAVENOUS | Status: DC | PRN
  Administered 2019-12-19: 50 mg via INTRAVENOUS

## 2019-12-19 MED ORDER — PROPOFOL 500 MG/50ML IV EMUL
INTRAVENOUS | Status: AC
  Filled 2019-12-19: qty 50

## 2019-12-19 NOTE — Op Note (Signed)
[facility] Gastroenterology Patient Name: Tinaya Ceballos Procedure Date: 12/19/2019 10:47 AM MRN: [id number] Account #: [id number] Date of Birth: [birth year] Admit Type: Outpatient Age: 64 Room: [facility] ENDO ROOM 1 Gender: Female Note Status: Finalized Procedure:             Colonoscopy Indications:           High risk colon cancer surveillance: Personal history                         of non-advanced adenoma Providers:             Benay Pike. Alice Reichert MD, MD Referring MD:          Salome Holmes (Referring MD) Medicines:             Propofol per Anesthesia Complications:         No immediate complications. Procedure:             Pre-Anesthesia Assessment:                        - The risks and benefits of the procedure and the                         sedation options and risks were discussed with the                         patient. All questions were answered and informed                         consent was obtained.                        - Patient identification and proposed procedure were                         verified prior to the procedure by the nurse. The                         procedure was verified in the procedure room.                        - ASA Grade Assessment: III - A patient with severe                         systemic disease.                        - After reviewing the risks and benefits, the patient                         was deemed in satisfactory condition to undergo the                         procedure.                        After obtaining informed consent, the colonoscope was                         passed under direct vision. Throughout the procedure,  the patient's blood pressure, pulse, and oxygen                         saturations were monitored continuously. The                         Colonoscope was introduced through the anus and                         advanced to the the cecum, identified by appendiceal                          orifice and ileocecal valve. The colonoscopy was                         performed without difficulty. The patient tolerated                         the procedure well. The quality of the bowel                         preparation was good. The ileocecal valve, appendiceal                         orifice, and rectum were photographed. Findings:      The perianal and digital rectal examinations were normal. Pertinent       negatives include normal sphincter tone and no palpable rectal lesions.      A few large-mouthed diverticula were found in the sigmoid colon.      Non-bleeding internal hemorrhoids were found during retroflexion. The       hemorrhoids were Grade I (internal hemorrhoids that do not prolapse).      The exam was otherwise without abnormality. Impression:            - Diverticulosis in the sigmoid colon.                        - Non-bleeding internal hemorrhoids.                        - The examination was otherwise normal.                        - No specimens collected. Recommendation:        - Patient has a contact number available for                         emergencies. The signs and symptoms of potential                         delayed complications were discussed with the patient.                         Return to normal activities tomorrow. Written                         discharge instructions were provided to the patient.                        -  Resume previous diet.                        - Continue present medications.                        - Repeat colonoscopy in 5 years for surveillance.                        - Return to GI office PRN.                        - The findings and recommendations were discussed with                         the patient. Procedure Code(s):     --- Professional ---                        W9798, Colorectal cancer screening; colonoscopy on                         individual at high risk Diagnosis Code(s):      --- Professional ---                        K64.0, First degree hemorrhoids                        K57.30, Diverticulosis of large intestine without                         perforation or abscess without bleeding                        Z86.010, Personal history of colonic polyps CPT copyright 2019 American Medical Association. All rights reserved. The codes documented in this report are preliminary and upon coder review may  be revised to meet current compliance requirements. Efrain Sella MD, MD 12/19/2019 11:33:52 AM This report has been signed electronically. Number of Addenda: 0 Note Initiated On: 12/19/2019 10:47 AM Scope Withdrawal Time: 0 hours 5 minutes 47 seconds  Total Procedure Duration: 0 hours 9 minutes 13 seconds  Estimated Blood Loss:  Estimated blood loss: none.      [facility]

## 2019-12-19 NOTE — Transfer of Care (Signed)
Immediate Anesthesia Transfer of Care Note  Patient: RAFAELITA FOISTER  Procedure(s) Performed: COLONOSCOPY (N/A )  Patient Location: Endoscopy Unit  Anesthesia Type:General  Level of Consciousness: drowsy and patient cooperative  Airway & Oxygen Therapy: Patient Spontanous Breathing and Patient connected to nasal cannula oxygen  Post-op Assessment: Report given to RN and Post -op Vital signs reviewed and stable  Post vital signs: Reviewed and stable  Last Vitals:  Vitals Value Taken Time  BP 110/56 12/19/19 1135  Temp    Pulse 73 12/19/19 1138  Resp 14 12/19/19 1138  SpO2 100 % 12/19/19 1138  Vitals shown include unvalidated device data.  Last Pain:  Vitals:   12/19/19 1130  TempSrc: Temporal  PainSc:          Complications: No complications documented.

## 2019-12-19 NOTE — H&P (Signed)
Outpatient short stay form Pre-procedure 12/19/2019 10:45 AM Colton Tassin K. Alice Reichert, M.D.  Primary Physician: Salome Holmes, M.D.  Reason for visit:  Personal history of adenomatous colon polyps (Feb 2016)  History of present illness:                           Patient presents for colonoscopy for a personal hx of colon polyps. The patient denies abdominal pain, abnormal weight loss or rectal bleeding.      Current Facility-Administered Medications:  .  0.9 %  sodium chloride infusion, , Intravenous, Continuous, Trampus Mcquerry, Benay Pike, MD  Medications Prior to Admission  Medication Sig Dispense Refill Last Dose  . albuterol (VENTOLIN HFA) 108 (90 Base) MCG/ACT inhaler Inhale into the lungs every 6 (six) hours as needed for wheezing or shortness of breath.   12/17/2019 at Unknown time  . aspirin EC 81 MG tablet Take by mouth.   12/17/2019 at Unknown time  . losartan (COZAAR) 100 MG tablet Take by mouth.   12/19/2019 at Unknown time  . NOVOLIN N 100 UNIT/ML injection INJ 80 UNITS [place] QAM AND 10 UNITS QPM OR UTD  0 12/18/2019 at Unknown time  . amLODipine (NORVASC) 10 MG tablet Take by mouth.     Marland Kitchen JANUVIA 100 MG tablet TK 1 T PO ONCE D  3 12/17/2019  . lovastatin (MEVACOR) 20 MG tablet Take by mouth.     . metoprolol tartrate (LOPRESSOR) 100 MG tablet Take by mouth.        Allergies  Allergen Reactions  . Sulfa Antibiotics Nausea And Vomiting     Past Medical History:  Diagnosis Date  . Allergic rhinitis   . Arthritis    sacroiliac joint  . Asthma   . Breast mass in female 09/05/2014  . Degenerative arthritis of lumbar spine   . Diabetes mellitus without complication ([place])   . Genu valgus, congenital   . GERD (gastroesophageal reflux disease)   . Hyperlipidemia   . Hypertension   . Hyponatremia   . Microalbuminuria   . Pes planus   . Renal disorder    CKD stage 3  . Tricompartment degenerative joint disease of knee   . Trochanteric bursitis of both hips   . 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    Physical Exam  Gen: Alert, oriented. Appears stated age.  HEENT: [place]/AT. PERRLA. Lungs: CTA, no wheezes. CV: RR nl S1, S2. Abd: soft, benign, no masses. BS+ Ext: No edema. Pulses 2+    Planned procedures: Proceed with colonoscopy. The patient understands the nature of the planned procedure, indications, risks, alternatives and potential complications including but not limited to bleeding, infection, perforation, damage to internal organs and possible oversedation/side effects from anesthesia. The patient agrees and gives consent to proceed.  Please refer to procedure notes for findings, recommendations and patient disposition/instructions.     Khaleel Beckom K. Alice Reichert, M.D. Gastroenterology 12/19/2019  10:45 AM

## 2019-12-19 NOTE — Anesthesia Postprocedure Evaluation (Signed)
Anesthesia Post Note  Patient: Heidi [name]  Procedure(s) Performed: COLONOSCOPY (N/A )  Patient location during evaluation: Endoscopy Anesthesia Type: General Level of consciousness: awake and awake and alert Pain management: pain level controlled Vital Signs Assessment: post-procedure vital signs reviewed and stable Respiratory status: spontaneous breathing and nonlabored ventilation Cardiovascular status: blood pressure returned to baseline and stable Postop Assessment: no headache and no apparent nausea or vomiting Anesthetic complications: no   No complications documented.   Last Vitals:  Vitals:   12/19/19 1140 12/19/19 1150  BP: 122/62 123/89  Pulse: 76 74  Resp: 19 14  Temp:    SpO2: 100% 100%    Last Pain:  Vitals:   12/19/19 1150  TempSrc:   PainSc: 0-No pain                 Neva Seat

## 2019-12-19 NOTE — Anesthesia Preprocedure Evaluation (Signed)
Anesthesia Evaluation  Patient identified by MRN, date of birth, ID band Patient awake    Reviewed: Allergy & Precautions, NPO status , Patient's Chart, lab work & pertinent test results  Airway Mallampati: II   Neck ROM: Full    Dental no notable dental hx.    Pulmonary neg pulmonary ROS,    Pulmonary exam normal breath sounds clear to auscultation       Cardiovascular hypertension, negative cardio ROS Normal cardiovascular exam Rhythm:Regular Rate:Normal     Neuro/Psych negative neurological ROS  negative psych ROS   GI/Hepatic Neg liver ROS, GERD  ,  Endo/Other  negative endocrine ROSdiabetes  Renal/GU CRFRenal disease  negative genitourinary   Musculoskeletal  (+) Arthritis ,   Abdominal   Peds negative pediatric ROS (+)  Hematology negative hematology ROS (+)   Anesthesia Other Findings .Marland KitchenPast Medical History: No date: Allergic rhinitis No date: Arthritis     Comment:  sacroiliac joint No date: Asthma 09/05/2014: Breast mass in female 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 No date: Diabetes mellitus without complication (HCC) 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, congenital No date: GERD (gastroesophageal reflux disease) No date: Hyperlipidemia No date: Hypertension No date: Hyponatremia No date: Microalbuminuria No date: Pes planus No date: Renal disorder     Comment:  CKD stage 3 No date: Tricompartment degenerative joint disease of knee No date: Trochanteric bursitis of both hips No date: 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[place])   Reproductive/Obstetrics negative OB ROS                             Anesthesia Physical Anesthesia Plan  ASA: III  Anesthesia Plan: General   Post-op Pain Management:    Induction: Intravenous  PONV Risk Score and Plan: 3 and Propofol infusion  Airway Management Planned: Nasal Cannula  Additional Equipment: None  Intra-op Plan:   Post-operative Plan:    Informed Consent: I have reviewed the patients History and Physical, chart, labs and discussed the procedure including the risks, benefits and alternatives for the proposed anesthesia with the patient or authorized representative who has indicated his/her understanding and acceptance.       Plan Discussed with: CRNA, Anesthesiologist and Surgeon  Anesthesia Plan Comments:         Anesthesia Quick Evaluation

## 2019-12-19 NOTE — Interval H&P Note (Signed)
History and Physical Interval Note:  12/19/2019 11:17 AM  Heidi [name]  has presented today for surgery, with the diagnosis of PERSONAL HX.OF COLON POLYPS.  The various methods of treatment have been discussed with the patient and family. After consideration of risks, benefits and other options for treatment, the patient has consented to  Procedure(s): COLONOSCOPY (N/A) as a surgical intervention.  The patient's history has been reviewed, patient examined, no change in status, stable for surgery.  I have reviewed the patient's chart and labs.  Questions were answered to the patient's satisfaction.     [place], [place]

## 2019-12-19 NOTE — Interval H&P Note (Signed)
History and Physical Interval Note:  12/19/2019 10:46 AM  Heidi [name]  has presented today for surgery, with the diagnosis of PERSONAL HX.OF COLON POLYPS.  The various methods of treatment have been discussed with the patient and family. After consideration of risks, benefits and other options for treatment, the patient has consented to  Procedure(s): COLONOSCOPY (N/A) as a surgical intervention.  The patient's history has been reviewed, patient examined, no change in status, stable for surgery.  I have reviewed the patient's chart and labs.  Questions were answered to the patient's satisfaction.     [place], [place]

## 2020-01-24 ENCOUNTER — Other Ambulatory Visit: Payer: Self-pay | Admitting: Family Medicine

## 2020-01-24 DIAGNOSIS — Z1231 Encounter for screening mammogram for malignant neoplasm of breast: Secondary | ICD-10-CM

## 2020-01-30 ENCOUNTER — Other Ambulatory Visit: Payer: Self-pay | Admitting: Family Medicine

## 2020-01-30 DIAGNOSIS — Z780 Asymptomatic menopausal state: Secondary | ICD-10-CM

## 2020-02-03 ENCOUNTER — Other Ambulatory Visit: Payer: Self-pay | Admitting: Family Medicine

## 2020-02-03 ENCOUNTER — Other Ambulatory Visit: Payer: Self-pay | Admitting: Urology

## 2020-02-03 ENCOUNTER — Ambulatory Visit
Admission: RE | Admit: 2020-02-03 | Discharge: 2020-02-03 | Disposition: A | Discharge status: Home / Self Care | Payer: 59 | Attending: Family Medicine | Admitting: Family Medicine

## 2020-02-03 ENCOUNTER — Ambulatory Visit
Admission: RE | Admit: 2020-02-03 | Discharge: 2020-02-03 | Disposition: A | Discharge status: Home / Self Care | Payer: 59 | Source: Ambulatory Visit | Attending: Family Medicine | Admitting: Family Medicine

## 2020-02-03 ENCOUNTER — Other Ambulatory Visit: Payer: Self-pay

## 2020-02-03 DIAGNOSIS — R059 Cough, unspecified: Secondary | ICD-10-CM | POA: Insufficient documentation

## 2020-03-06 ENCOUNTER — Ambulatory Visit
Admission: RE | Admit: 2020-03-06 | Discharge: 2020-03-06 | Disposition: A | Discharge status: Home / Self Care | Payer: 59 | Source: Ambulatory Visit | Attending: Family Medicine | Admitting: Family Medicine

## 2020-03-06 ENCOUNTER — Other Ambulatory Visit: Payer: Self-pay

## 2020-03-06 DIAGNOSIS — Z1231 Encounter for screening mammogram for malignant neoplasm of breast: Secondary | ICD-10-CM | POA: Diagnosis not present

## 2021-02-07 HISTORY — PX: OTHER SURGICAL HISTORY: SHX169

## 2021-02-18 DIAGNOSIS — S2221XA Fracture of manubrium, initial encounter for closed fracture: Secondary | ICD-10-CM | POA: Insufficient documentation

## 2021-02-18 DIAGNOSIS — R739 Hyperglycemia, unspecified: Secondary | ICD-10-CM | POA: Insufficient documentation

## 2021-03-10 DIAGNOSIS — S82841B Displaced bimalleolar fracture of right lower leg, initial encounter for open fracture type I or II: Secondary | ICD-10-CM | POA: Insufficient documentation

## 2021-03-10 DIAGNOSIS — S92121B Displaced fracture of body of right talus, initial encounter for open fracture: Secondary | ICD-10-CM | POA: Insufficient documentation

## 2021-04-15 ENCOUNTER — Other Ambulatory Visit: Payer: Self-pay | Admitting: Family Medicine

## 2021-04-15 DIAGNOSIS — Z1231 Encounter for screening mammogram for malignant neoplasm of breast: Secondary | ICD-10-CM

## 2021-04-21 IMAGING — MG MM DIGITAL SCREENING BILAT W/ TOMO AND CAD
6 of 10 series · 6 of 30 positions shown · non-contrast
Comparison: Previous exam(s).

CLINICAL DATA: Screening.

EXAM:
DIGITAL SCREENING BILATERAL MAMMOGRAM WITH TOMO AND CAD

[R MLO synth-2D (1 of 2)]
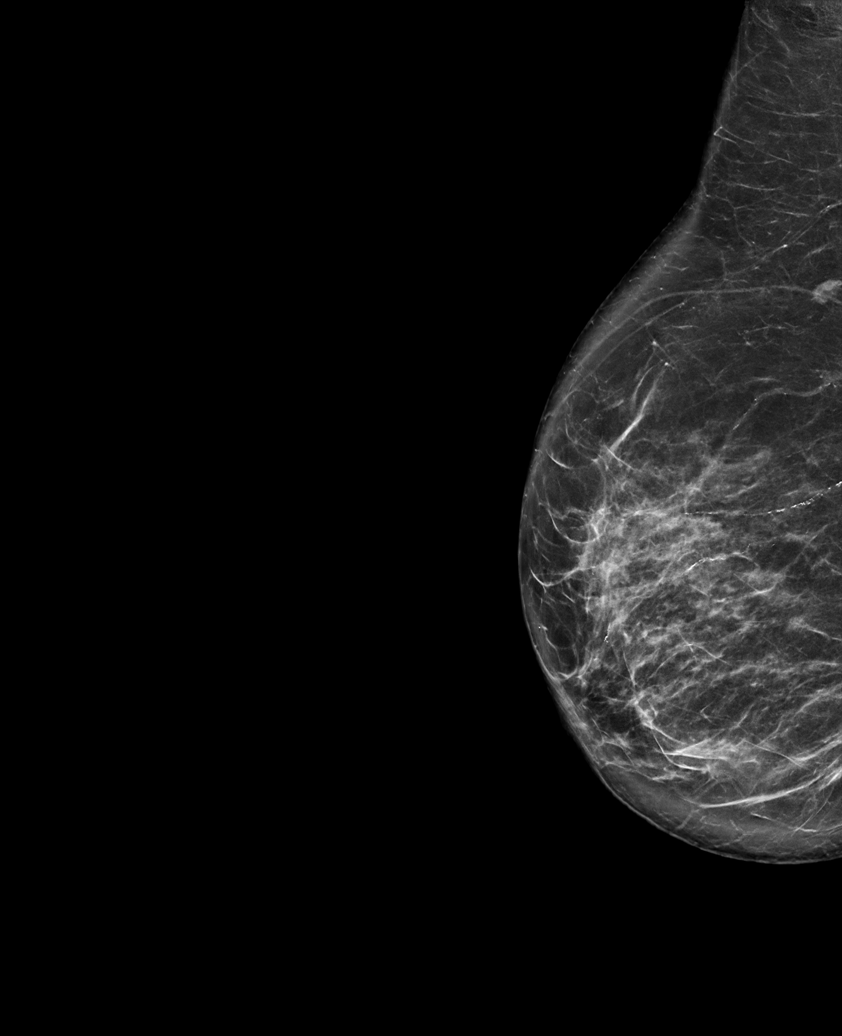

[R CC synth-2D]
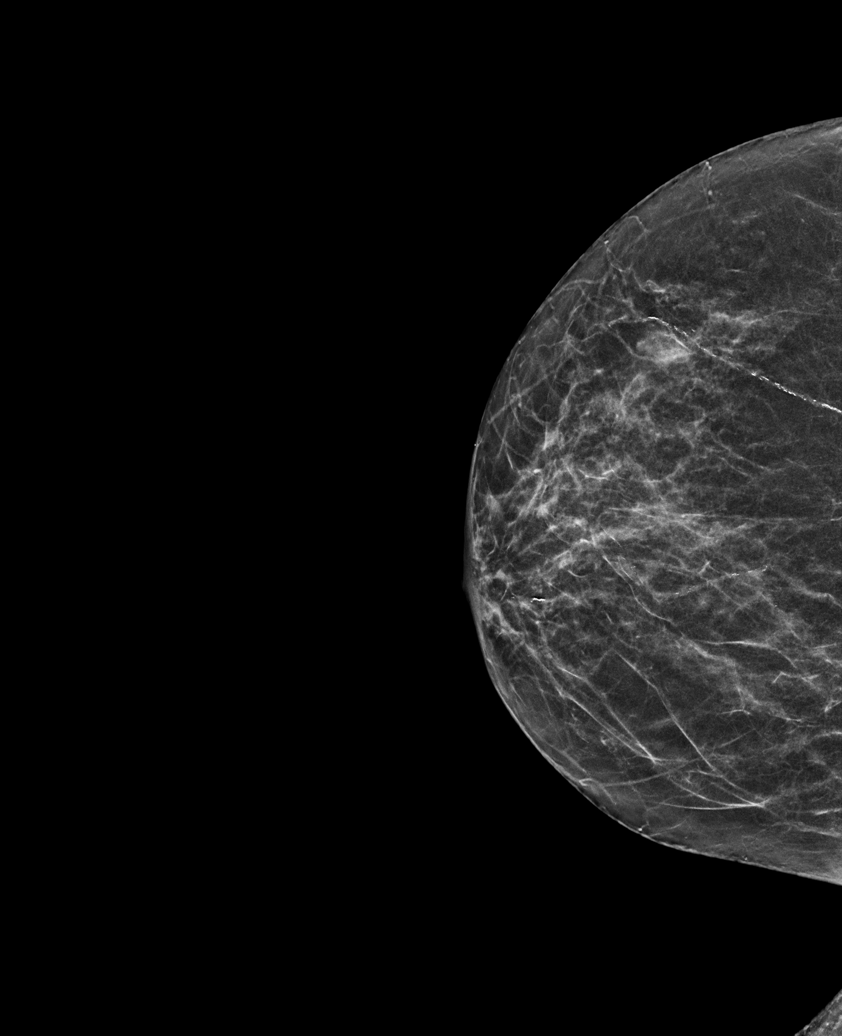

[L MLO synth-2D]
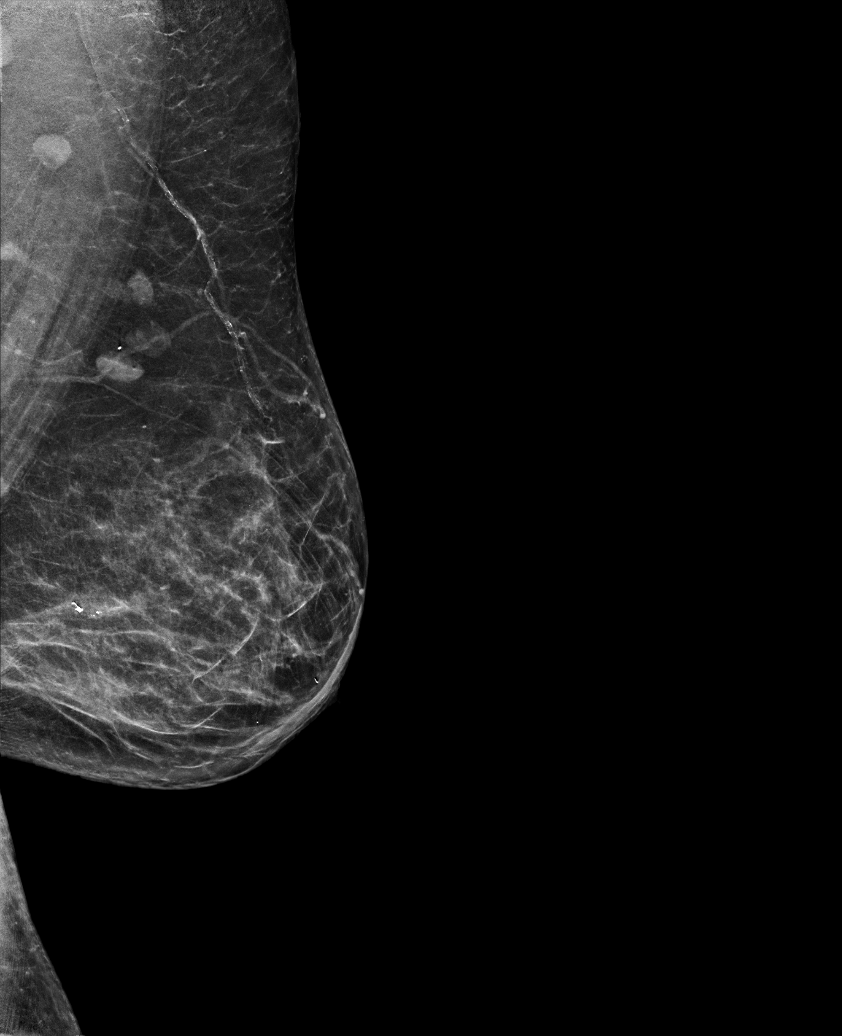

[L CC synth-2D]
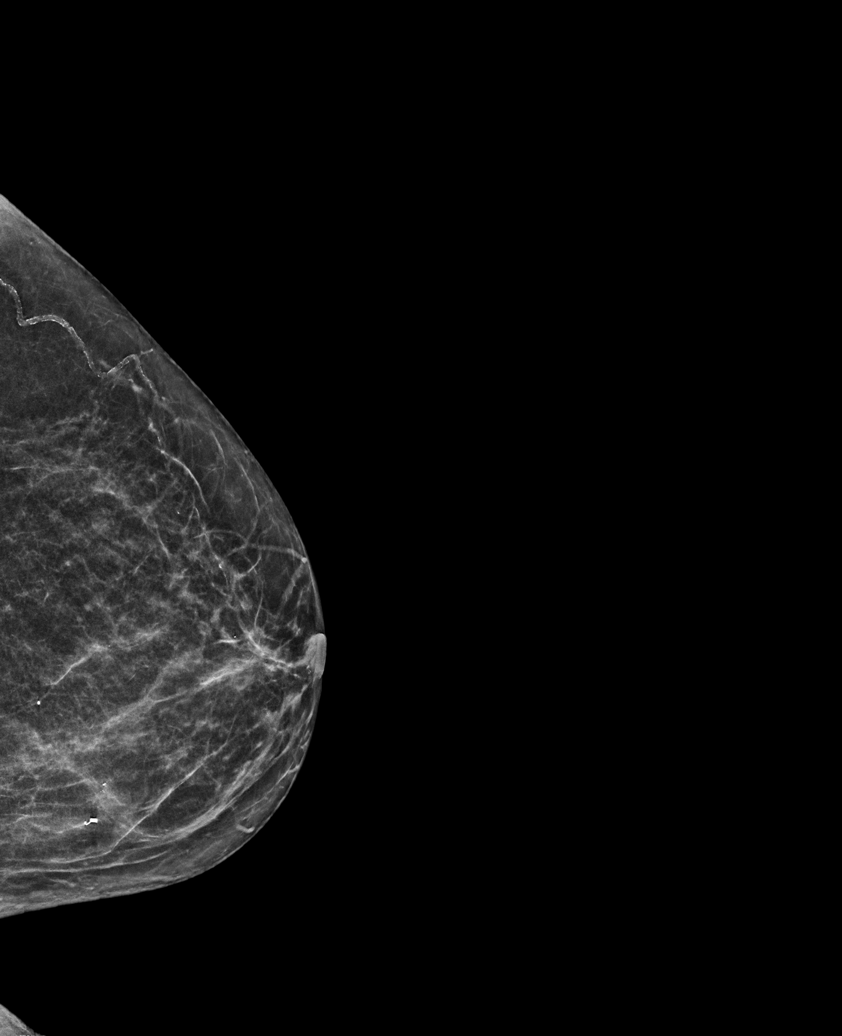

[R MLO synth-2D (2 of 2)]
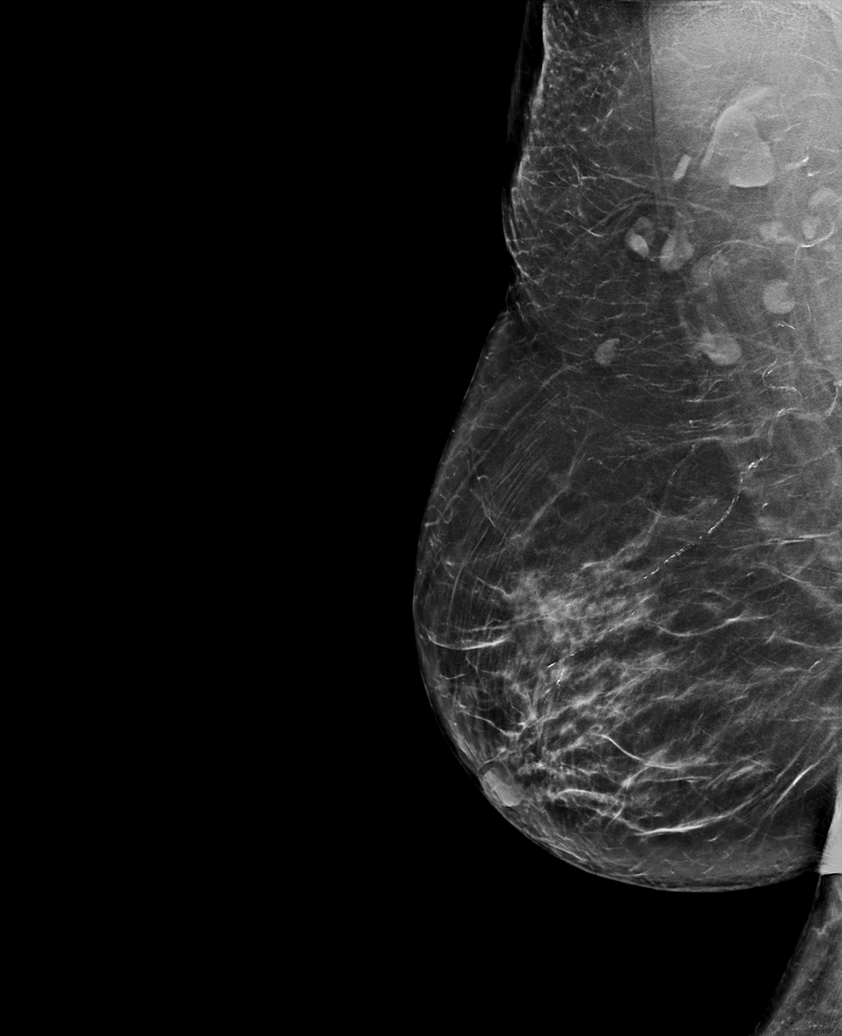

[L MLO tomo · tomo slice 34/67.0]
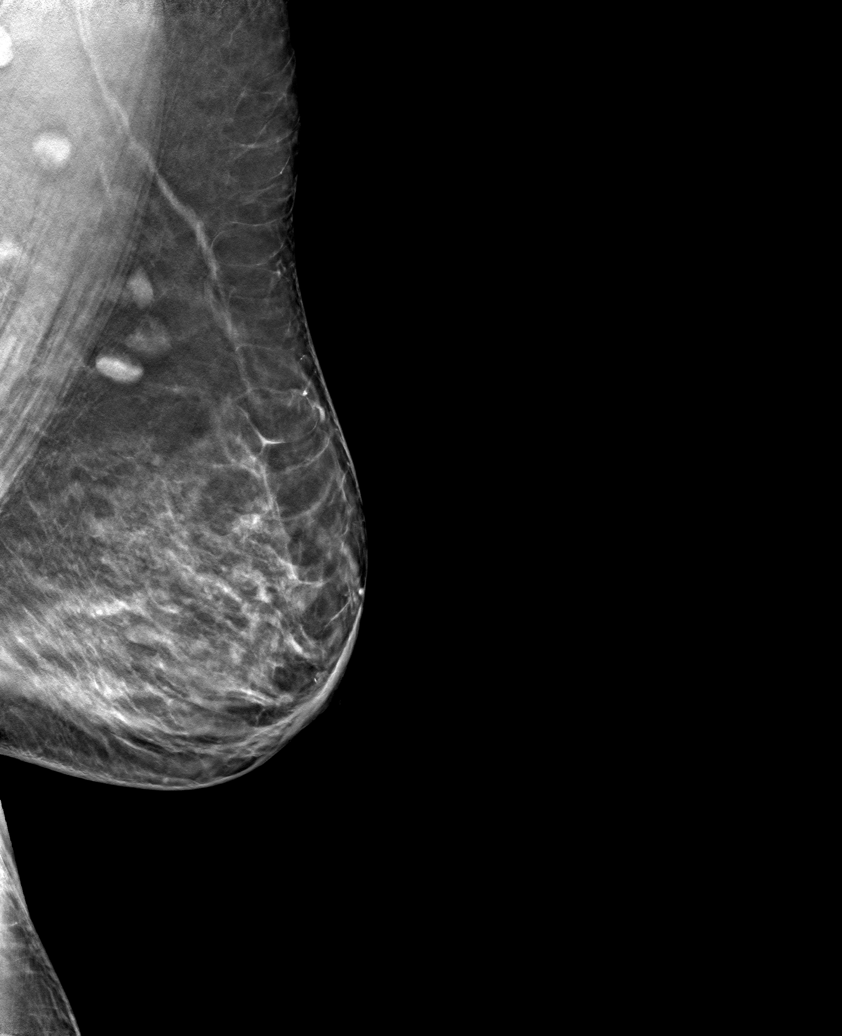

[6 of 30 positions shown; findings below may reference images not displayed]

ACR Breast Density Category b: There are scattered areas of
fibroglandular density.
FINDINGS: There are no findings suspicious for malignancy. The images were
evaluated with computer-aided detection.
IMPRESSION: No mammographic evidence of malignancy. A result letter of this
screening mammogram will be mailed directly to the patient.

RECOMMENDATION:
Screening mammogram in one year. (Code:ZP-7-VX7)

BI-RADS CATEGORY  1: Negative.

## 2021-05-21 ENCOUNTER — Ambulatory Visit
Admission: RE | Admit: 2021-05-21 | Discharge: 2021-05-21 | Disposition: A | Discharge status: Home / Self Care | Payer: 59 | Source: Ambulatory Visit | Attending: Family Medicine | Admitting: Family Medicine

## 2021-05-21 DIAGNOSIS — Z1231 Encounter for screening mammogram for malignant neoplasm of breast: Secondary | ICD-10-CM | POA: Insufficient documentation

## 2021-05-24 ENCOUNTER — Other Ambulatory Visit: Payer: Self-pay | Admitting: Family Medicine

## 2021-05-24 DIAGNOSIS — N6310 Unspecified lump in the right breast, unspecified quadrant: Secondary | ICD-10-CM

## 2021-06-02 ENCOUNTER — Encounter: Payer: Self-pay | Admitting: Surgery

## 2021-06-02 ENCOUNTER — Ambulatory Visit: Payer: 59 | Admitting: Surgery

## 2021-06-02 VITALS — BP 167/105 | HR 94 | Temp 98.1°F | Ht 64.0 in | Wt 204.0 lb

## 2021-06-02 DIAGNOSIS — N186 End stage renal disease: Secondary | ICD-10-CM | POA: Diagnosis not present

## 2021-06-02 DIAGNOSIS — Z992 Dependence on renal dialysis: Secondary | ICD-10-CM

## 2021-06-02 NOTE — Patient Instructions (Signed)
If you have any concerns or questions, please feel free to call our office.  ? ?Peritoneal Dialysis Catheter Placement ? ?Peritoneal dialysis catheter placement is a surgery to insert a thin, flexible tube (catheter) into the abdomen. The catheter will be used for peritoneal dialysis, which is a process for filtering the blood. The catheter is small, soft, and easy to conceal. The catheter placement is usually done at least 2 weeks before peritoneal dialysis is started. ?During dialysis, wastes, salt, and extra water are removed from the blood. In peritoneal dialysis, these tasks are performed by transferring a fluid (dialysate) to and from the abdomen during each session. The fluid goes through the catheter to enter the abdomen at the start of each dialysis session, and it drains out of the body through the catheter at the end of each session. ?This procedure is done using one of the following techniques: ?Open technique. This is when the surgery is performed through one large incision. ?Laparoscopic technique. This is when smaller incisions are made and a tube with a light and camera (laparoscope) is inserted through one of the incisions to help perform the surgery. The camera sends images to a video screen in the operating room. This lets the surgeon see inside the abdomen during the procedure. ?Tell a health care provider about: ?Any allergies you have. ?All medicines you are taking, including vitamins, herbs, eye drops, creams, and over-the-counter medicines. ?Any problems you or family members have had with anesthetic medicines. ?Any blood disorders you have. ?Any surgeries you have had. ?Any history of smoking. ?Any medical conditions you have. ?Whether you are pregnant or may be pregnant. ?What are the risks? ?Generally, this is a safe procedure. However, problems may occur, including: ?Infection. ?Too much bleeding. ?A collection of blood near the incision (hematoma). ?Damage to blood vessels, tissues, or  organs in the abdomen area. ?Allergic reactions to medicines. ?Pain or cramping. ?Slow healing. ?Catheter problems after the surgery. The catheter may: ?Become blocked. ?Move out of place. ?Poke or wrap around intestines. ?Allow fluid to leak around it. ?Scarring. ?Skin damage. ?What happens before the procedure? ?Staying hydrated ?Follow instructions from your health care provider about hydration, which may include: ?Up to 2 hours before the procedure - you may continue to drink clear liquids, such as water, clear fruit juice, black coffee, and plain tea. ? ?Eating and drinking restrictions ?Follow instructions from your health care provider about eating and drinking, which may include: ?8 hours before the procedure - stop eating heavy meals or foods, such as meat, fried foods, or fatty foods. ?6 hours before the procedure - stop eating light meals or foods, such as toast or cereal. ?6 hours before the procedure - stop drinking milk or drinks that contain milk. ?2 hours before the procedure - stop drinking clear liquids. ?Medicines ?Ask your health care provider about: ?Changing or stopping your regular medicines. This is especially important if you are taking diabetes medicines or blood thinners. ?Taking medicines such as aspirin and ibuprofen. These medicines can thin your blood. Do not take these medicines unless your health care provider tells you to take them. ?Taking over-the-counter medicines, vitamins, herbs, and supplements. ?Surgery safety ?Ask your health care provider: ?How your surgery site will be marked. ?What steps will be taken to help prevent infection. These steps may include: ?Removing hair at the surgery site. ?Washing skin with a germ-killing soap. ?Taking antibiotic medicine. ?General instructions ?You may have a CT scan or ultrasound of your abdomen. ?You may  have a blood sample taken. ?Plan to have a responsible adult take you home from the hospital or clinic. ?Your health care provider  will discuss the best site for the catheter to be placed. The site will be chosen: ?To help prevent the catheter from being flattened or damaged. ?To make it as comfortable as possible for you. ?What happens during the procedure? ?An IV will be inserted into one of your veins. ?You will be given one or both of the following: ?A medicine to help you relax (sedative). ?A medicine to numb the area (local anesthetic). ?If you are having an open surgery, one large incision will be made in the abdomen. ?If you are having laparoscopic surgery, small incisions will be made in the abdomen. A laparoscope and instruments will be put through the incisions. ?The catheter will be put in place. ?A short tunnel will be made under the skin to a location where the catheter exits the abdomen. ?Stitches (sutures) will be placed around the catheter to hold it in place. ?Your incisions will be closed with sutures or staples. ?The procedure may vary among health care providers and hospitals. ?What happens after the procedure? ?Your blood pressure, heart rate, breathing rate, and blood oxygen level will be monitored until you leave the hospital or clinic. ?You may have some pain. You will be given pain medicine as needed. ?You will be given instructions about how to care for your catheter and how it is used for the dialysis process. ?Summary ?Peritoneal dialysis catheter placement is a surgery to insert a thin, flexible tube (catheter) in your abdomen. This surgery must be done before you begin peritoneal dialysis. ?Before the procedure, your health care provider will discuss the best site for the catheter to be placed. The site will be chosen to help prevent the catheter from being flattened or damaged, and to make it as comfortable as possible for you. ?After the procedure, you will be given instructions about how to care for your catheter and how it is used for the dialysis process. ?This information is not intended to replace advice  given to you by your health care provider. Make sure you discuss any questions you have with your health care provider. ?Document Revised: 09/12/2019 Document Reviewed: 09/12/2019 ?Elsevier Patient Education ? [place]. ? ?
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
## 2021-06-07 ENCOUNTER — Ambulatory Visit
Admission: RE | Admit: 2021-06-07 | Discharge: 2021-06-07 | Disposition: A | Discharge status: Home / Self Care | Payer: 59 | Source: Ambulatory Visit | Attending: Family Medicine | Admitting: Family Medicine

## 2021-06-07 DIAGNOSIS — N6310 Unspecified lump in the right breast, unspecified quadrant: Secondary | ICD-10-CM | POA: Insufficient documentation

## 2021-06-16 ENCOUNTER — Other Ambulatory Visit: Payer: Self-pay | Admitting: Family Medicine

## 2021-06-16 ENCOUNTER — Ambulatory Visit: Payer: 59 | Admitting: Surgery

## 2021-06-16 DIAGNOSIS — N6311 Unspecified lump in the right breast, upper outer quadrant: Secondary | ICD-10-CM

## 2021-07-14 ENCOUNTER — Ambulatory Visit: Payer: 59 | Admitting: Surgery

## 2021-09-20 ENCOUNTER — Encounter: Payer: Self-pay | Admitting: Surgery

## 2021-09-20 ENCOUNTER — Ambulatory Visit (INDEPENDENT_AMBULATORY_CARE_PROVIDER_SITE_OTHER): Payer: Medicare HMO | Admitting: Surgery

## 2021-09-20 ENCOUNTER — Telehealth: Payer: Self-pay | Admitting: Surgery

## 2021-09-20 ENCOUNTER — Other Ambulatory Visit: Payer: Self-pay

## 2021-09-20 VITALS — BP 160/75 | HR 83 | Temp 98.3°F | Ht 64.0 in | Wt 188.6 lb

## 2021-09-20 DIAGNOSIS — N186 End stage renal disease: Secondary | ICD-10-CM

## 2021-09-20 DIAGNOSIS — Z992 Dependence on renal dialysis: Secondary | ICD-10-CM | POA: Diagnosis not present

## 2021-09-20 DIAGNOSIS — E871 Hypo-osmolality and hyponatremia: Secondary | ICD-10-CM | POA: Insufficient documentation

## 2021-09-20 NOTE — Patient Instructions (Signed)
Our surgery scheduler will call you within 24-48 hours to schedule your surgery. Please have the Blue surgery sheet available when speaking with her.  

## 2021-09-20 NOTE — Telephone Encounter (Signed)
Patient has been advised of Pre-Admission date/time, and Surgery date.  Surgery Date: 10/07/21 Preadmission Testing Date: 09/28/21 (phone 8a-1p)  Patient has been made aware to call [phone], between 1-3:00pm the day before surgery, to find out what time to arrive for surgery.

## 2021-09-20 NOTE — Progress Notes (Signed)
Outpatient Surgical Follow Up  09/20/2021  Heidi [name] is an 66 y.o. female.   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  I saw her back close to 4 months ago and at that time she was attentive.  She is now convinced that she needs to have this done.  Does have an umbilical hernia that is also seen in the CT scan.  Please note I have personally reviewed this. 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[place])     Past Surgical History:  Procedure Laterality Date   ABDOMINAL HYSTERECTOMY     BREAST BIOPSY Left 07/24/2014   [place]   BREAST BIOPSY Right 1996   benign   BREAST EXCISIONAL BIOPSY Right 2016   COLONOSCOPY     COLONOSCOPY N/A 12/19/2019   Procedure: COLONOSCOPY;  Surgeon: Toledo, Benay Pike, MD;  Location: [facility] ENDOSCOPY;  Service: Gastroenterology;  Laterality: N/A;   EXPLORATORY LAPAROTOMY     of colon for torsion   NASAL FRACTURE SURGERY      Family History  Problem Relation Age of Onset   Breast cancer Maternal Aunt     Social History:  reports that she has never smoked. She has never used smokeless tobacco. She reports current alcohol use. She reports that she does not use drugs.  Allergies:  Allergies  Allergen Reactions   Sulfa Antibiotics Nausea And Vomiting   Misc. Sulfonamide Containing Compounds  Nausea And Vomiting    Medications reviewed.    ROS Full ROS performed and is otherwise negative other than what is stated in HPI   BP (!) 160/75   Pulse 83   Temp 98.3 F (36.8 C) (Oral)   Ht 5\' 4"  (1.626 m)   Wt 188 lb 9.6 oz (85.5 kg)   SpO2 99%   BMI 32.37 kg/m   Physical Exam CONSTITUTIONAL: NAD. EYES: Pupils are equal, round,  Sclera are non-icteric. EARS, NOSE, MOUTH AND THROAT: The oral mucosa is pink and moist. Hearing is intact to voice. LYMPH NODES:  Lymph nodes in the neck are normal. RESPIRATORY:  Lungs are clear. There is normal respiratory effort, with equal breath sounds bilaterally, and without pathologic use of accessory muscles. CARDIOVASCULAR: Heart is regular without murmurs, gallops, or rubs. GI: The abdomen is  soft, nontender, and nondistended. There are no palpable masses. There is no hepatosplenomegaly.  There is evidence of about a 3 cm umbilical hernia that is reducible mildly tender to palpation there are normal bowel sounds GU: Rectal deferred.   MUSCULOSKELETAL: Normal muscle strength and tone. No cyanosis or edema.   SKIN: Turgor is good and there are no pathologic skin lesions or ulcers. NEUROLOGIC: Motor and sensation  is grossly normal. Cranial nerves are grossly intact. PSYCH:  Oriented to person, place and time. Affect is norm   Assessment/Plan: 66 year old female with worsening renal insufficiency approaching the need for dialysis.  I had an extensive discussion with the patient regarding different forms of dialysis.  We talked about hemodialysis versus peritoneal dialysis.  She is interested in peritoneal dialysis.  I have also discussed with her about laparoscopic placement of catheter.  She does have an umbilical hernia that will need to be fixed during the same operative setting.  We specifically discussed that.   I was able to answer all her questions.  She is ready to move forward with planned procedure in the next 2 weeks..  We  discussed in detail the procedure the postoperative care and the usual routine of PD.  Risk, benefits and possible include include but not limited to: Bleeding, infection, catheter malfunction, bowel injuries, chronic pain.   I spent 40 minutes in this encounter including personally reviewing imaging studies, counseling her,  coordination her care, placing orders and performing appropriate documentation  Caroleen Hamman, MD [place] Surgeon

## 2021-09-20 NOTE — H&P (View-Only) (Signed)
Outpatient Surgical Follow Up  09/20/2021  Heidi [name] is an 66 y.o. female.   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  I saw her back close to 4 months ago and at that time she was attentive.  She is now convinced that she needs to have this done.  Does have an umbilical hernia that is also seen in the CT scan.  Please note I have personally reviewed this. 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[place])     Past Surgical History:  Procedure Laterality Date   ABDOMINAL HYSTERECTOMY     BREAST BIOPSY Left 07/24/2014   [place]   BREAST BIOPSY Right 1996   benign   BREAST EXCISIONAL BIOPSY Right 2016   COLONOSCOPY     COLONOSCOPY N/A 12/19/2019   Procedure: COLONOSCOPY;  Surgeon: Toledo, Benay Pike, MD;  Location: [facility] ENDOSCOPY;  Service: Gastroenterology;  Laterality: N/A;   EXPLORATORY LAPAROTOMY     of colon for torsion   NASAL FRACTURE SURGERY      Family History  Problem Relation Age of Onset   Breast cancer Maternal Aunt     Social History:  reports that she has never smoked. She has never used smokeless tobacco. She reports current alcohol use. She reports that she does not use drugs.  Allergies:  Allergies  Allergen Reactions   Sulfa Antibiotics Nausea And Vomiting   Misc. Sulfonamide Containing Compounds  Nausea And Vomiting    Medications reviewed.    ROS Full ROS performed and is otherwise negative other than what is stated in HPI   BP (!) 160/75   Pulse 83   Temp 98.3 F (36.8 C) (Oral)   Ht 5\' 4"  (1.626 m)   Wt 188 lb 9.6 oz (85.5 kg)   SpO2 99%   BMI 32.37 kg/m   Physical Exam CONSTITUTIONAL: NAD. EYES: Pupils are equal, round,  Sclera are non-icteric. EARS, NOSE, MOUTH AND THROAT: The oral mucosa is pink and moist. Hearing is intact to voice. LYMPH NODES:  Lymph nodes in the neck are normal. RESPIRATORY:  Lungs are clear. There is normal respiratory effort, with equal breath sounds bilaterally, and without pathologic use of accessory muscles. CARDIOVASCULAR: Heart is regular without murmurs, gallops, or rubs. GI: The abdomen is  soft, nontender, and nondistended. There are no palpable masses. There is no hepatosplenomegaly.  There is evidence of about a 3 cm umbilical hernia that is reducible mildly tender to palpation there are normal bowel sounds GU: Rectal deferred.   MUSCULOSKELETAL: Normal muscle strength and tone. No cyanosis or edema.   SKIN: Turgor is good and there are no pathologic skin lesions or ulcers. NEUROLOGIC: Motor and sensation  is grossly normal. Cranial nerves are grossly intact. PSYCH:  Oriented to person, place and time. Affect is norm   Assessment/Plan: 66 year old female with worsening renal insufficiency approaching the need for dialysis.  I had an extensive discussion with the patient regarding different forms of dialysis.  We talked about hemodialysis versus peritoneal dialysis.  She is interested in peritoneal dialysis.  I have also discussed with her about laparoscopic placement of catheter.  She does have an umbilical hernia that will need to be fixed during the same operative setting.  We specifically discussed that.   I was able to answer all her questions.  She is ready to move forward with planned procedure in the next 2 weeks..  We  discussed in detail the procedure the postoperative care and the usual routine of PD.  Risk, benefits and possible include include but not limited to: Bleeding, infection, catheter malfunction, bowel injuries, chronic pain.   I spent 40 minutes in this encounter including personally reviewing imaging studies, counseling her,  coordination her care, placing orders and performing appropriate documentation  Caroleen Hamman, MD [place] Surgeon

## 2021-09-27 ENCOUNTER — Ambulatory Visit
Admission: RE | Admit: 2021-09-27 | Discharge: 2021-09-27 | Disposition: A | Discharge status: Home / Self Care | Payer: Medicare HMO | Source: Ambulatory Visit | Attending: Family Medicine | Admitting: Family Medicine

## 2021-09-27 DIAGNOSIS — N6311 Unspecified lump in the right breast, upper outer quadrant: Secondary | ICD-10-CM | POA: Diagnosis not present

## 2021-09-28 ENCOUNTER — Encounter
Admission: RE | Admit: 2021-09-28 | Discharge: 2021-09-28 | Disposition: A | Discharge status: Home / Self Care | Payer: Medicare HMO | Source: Ambulatory Visit | Attending: Surgery | Admitting: Surgery

## 2021-09-28 VITALS — Ht 64.0 in | Wt 189.0 lb

## 2021-09-28 DIAGNOSIS — R739 Hyperglycemia, unspecified: Secondary | ICD-10-CM

## 2021-09-28 DIAGNOSIS — Z794 Long term (current) use of insulin: Secondary | ICD-10-CM

## 2021-09-28 DIAGNOSIS — I10 Essential (primary) hypertension: Secondary | ICD-10-CM

## 2021-09-28 NOTE — Patient Instructions (Addendum)
Your procedure is scheduled on: Thursday October 07, 2021. Report to Day Surgery inside [facility] 2nd floor, stop by admissions desk before getting on elevator.  To find out your arrival time please call [phone] between 1PM - 3PM on Wednesday October 06, 2021.  Remember: Instructions that are not followed completely may result in serious medical risk,  up to and including death, or upon the discretion of your surgeon and anesthesiologist your  surgery may need to be rescheduled.     _X__ 1. Do not eat food or drink fluids after midnight the night before your procedure.                 No chewing gum or hard candies.   __X__2.  On the morning of surgery brush your teeth with toothpaste and water, you                may rinse your mouth with mouthwash if you wish.  Do not swallow any toothpaste or mouthwash.     _X__ 3.  No Alcohol for 24 hours before or after surgery.   _X__ 4.  Do Not Smoke or use e-cigarettes For 24 Hours Prior to Your Surgery.                 Do not use any chewable tobacco products for at least 6 hours prior to                 Surgery.  _X__  5.  Do not use any recreational drugs (marijuana, cocaine, heroin, ecstasy, MDMA or other)                For at least one week prior to your surgery.  Combination of these drugs with anesthesia                May have life threatening results.  ____  6.  Bring all medications with you on the day of surgery if instructed.   __X_ 7.  Notify your doctor if there is any change in your medical condition      (cold, fever, infections).     Do not wear jewelry, make-up, hairpins, clips or nail polish. Do not wear lotions, powders, or perfumes. You may wear deodorant. Do not shave 48 hours prior to surgery. Men may shave face and neck. Do not bring valuables to the hospital.    [facility] is not responsible for any belongings or valuables.  Contacts, dentures or bridgework may not be worn into  surgery. Leave your suitcase in the car. After surgery it may be brought to your room. For patients admitted to the hospital, discharge time is determined by your treatment team.   Patients discharged the day of surgery will not be allowed to drive home.   Make arrangements for someone to be with you for the first 24 hours of your Same Day Discharge.   __X__ Take these medicines the morning of surgery with A SIP OF WATER:    1. amLODipine (NORVASC) 10 MG tablet  2. lovastatin (MEVACOR) 20 MG tablet  3. metoprolol tartrate (LOPRESSOR) 100 MG tablet  4.   5.  6.  ____ Fleet Enema (as directed)   __X__ Use CHG Soap (or wipes) as directed  ____ Use Benzoyl Peroxide Gel as instructed  ____ Use inhalers on the day of surgery  ____ Stop metformin 2 days prior to surgery    __X__ Take 1/2 of usual insulin dose the  night before surgery. No insulin the morning          of surgery. NOVOLIN N 100 UNIT/ML injection  ____ Call your PCP, cardiologist, or Pulmonologist if taking aspirin and ask when to stop before your surgery.   __X__ One Week prior to surgery- Stop Anti-inflammatories such as Ibuprofen, Aleve, Advil, Motrin, meloxicam (MOBIC), diclofenac, etodolac, ketorolac, Toradol, Daypro, piroxicam, Goody's or BC powders. OK TO USE TYLENOL IF NEEDED   __X__ Do not start any new vitamins and or supplements until after surgery.    ____ Bring C-Pap to the hospital.    If you have any questions regarding your pre-procedure instructions,  Please call Pre-admit Testing at [phone]

## 2021-09-29 ENCOUNTER — Encounter
Admission: RE | Admit: 2021-09-29 | Discharge: 2021-09-29 | Disposition: A | Discharge status: Home / Self Care | Payer: Medicare HMO | Source: Ambulatory Visit | Attending: Surgery | Admitting: Surgery

## 2021-09-29 DIAGNOSIS — N1831 Chronic kidney disease, stage 3a: Secondary | ICD-10-CM | POA: Diagnosis not present

## 2021-09-29 DIAGNOSIS — E1122 Type 2 diabetes mellitus with diabetic chronic kidney disease: Secondary | ICD-10-CM | POA: Diagnosis not present

## 2021-09-29 DIAGNOSIS — Z01818 Encounter for other preprocedural examination: Secondary | ICD-10-CM | POA: Insufficient documentation

## 2021-09-29 DIAGNOSIS — R9431 Abnormal electrocardiogram [ECG] [EKG]: Secondary | ICD-10-CM | POA: Diagnosis not present

## 2021-09-29 DIAGNOSIS — R739 Hyperglycemia, unspecified: Secondary | ICD-10-CM

## 2021-09-29 DIAGNOSIS — I10 Essential (primary) hypertension: Secondary | ICD-10-CM

## 2021-09-29 DIAGNOSIS — Z794 Long term (current) use of insulin: Secondary | ICD-10-CM | POA: Insufficient documentation

## 2021-09-29 LAB — CBC
HCT: 27.8 % — ABNORMAL LOW (ref 36.0–46.0)
Hemoglobin: 8.6 g/dL — ABNORMAL LOW (ref 12.0–15.0)
MCH: 26.9 pg (ref 26.0–34.0)
MCHC: 30.9 g/dL (ref 30.0–36.0)
MCV: 86.9 fL (ref 80.0–100.0)
Platelets: 338 10*3/uL (ref 150–400)
RBC: 3.2 MIL/uL — ABNORMAL LOW (ref 3.87–5.11)
RDW: 16.3 % — ABNORMAL HIGH (ref 11.5–15.5)
WBC: 8.1 10*3/uL (ref 4.0–10.5)
nRBC: 0 % (ref 0.0–0.2)

## 2021-09-29 LAB — BASIC METABOLIC PANEL
Anion gap: 7 (ref 5–15)
BUN: 48 mg/dL — ABNORMAL HIGH (ref 8–23)
CO2: 20 mmol/L — ABNORMAL LOW (ref 22–32)
Calcium: 9.1 mg/dL (ref 8.9–10.3)
Chloride: 110 mmol/L (ref 98–111)
Creatinine, Ser: 6.83 mg/dL — ABNORMAL HIGH (ref 0.44–1.00)
GFR, Estimated: 6 mL/min — ABNORMAL LOW (ref >60)
Glucose, Bld: 220 mg/dL — ABNORMAL HIGH (ref 70–99)
Potassium: 4.9 mmol/L (ref 3.5–5.1)
Sodium: 137 mmol/L (ref 135–145)

## 2021-10-06 MED ORDER — SODIUM CHLORIDE 0.9 % IV SOLN: INTRAVENOUS | Status: DC

## 2021-10-06 MED ORDER — CHLORHEXIDINE GLUCONATE CLOTH 2 % EX PADS
6.0000 | MEDICATED_PAD | Freq: Once | CUTANEOUS | Status: AC
  Administered 2021-10-07: 6 via TOPICAL

## 2021-10-06 MED ORDER — CELECOXIB 200 MG PO CAPS: 200.0000 mg | ORAL_CAPSULE | ORAL | Status: AC

## 2021-10-06 MED ORDER — ACETAMINOPHEN 500 MG PO TABS: 1000.0000 mg | ORAL_TABLET | ORAL | Status: AC

## 2021-10-06 MED ORDER — CEFAZOLIN SODIUM-DEXTROSE 2-4 GM/100ML-% IV SOLN
2.0000 g | INTRAVENOUS | Status: AC
  Administered 2021-10-07: 2 g via INTRAVENOUS

## 2021-10-06 MED ORDER — CHLORHEXIDINE GLUCONATE 0.12 % MT SOLN: 15.0000 mL | Freq: Once | OROMUCOSAL | Status: AC

## 2021-10-06 MED ORDER — ORAL CARE MOUTH RINSE: 15.0000 mL | Freq: Once | OROMUCOSAL | Status: AC

## 2021-10-06 MED ORDER — GABAPENTIN 300 MG PO CAPS: 300.0000 mg | ORAL_CAPSULE | ORAL | Status: AC

## 2021-10-06 MED ORDER — CHLORHEXIDINE GLUCONATE CLOTH 2 % EX PADS: 6.0000 | MEDICATED_PAD | Freq: Once | CUTANEOUS | Status: DC

## 2021-10-06 MED ORDER — FAMOTIDINE 20 MG PO TABS: 20.0000 mg | ORAL_TABLET | Freq: Once | ORAL | Status: AC

## 2021-10-07 ENCOUNTER — Encounter: Payer: Self-pay | Admitting: Surgery

## 2021-10-07 ENCOUNTER — Encounter
Admission: RE | Disposition: A | Discharge status: Home / Self Care | Payer: Self-pay | Source: Home / Self Care | Attending: Surgery

## 2021-10-07 ENCOUNTER — Ambulatory Visit
Admission: RE | Admit: 2021-10-07 | Discharge: 2021-10-07 | Disposition: A | Discharge status: Home / Self Care | Payer: Medicare HMO | Attending: Surgery | Admitting: Surgery

## 2021-10-07 ENCOUNTER — Ambulatory Visit: Payer: Medicare HMO | Admitting: General Practice

## 2021-10-07 ENCOUNTER — Other Ambulatory Visit: Payer: Self-pay

## 2021-10-07 DIAGNOSIS — Z90710 Acquired absence of both cervix and uterus: Secondary | ICD-10-CM | POA: Insufficient documentation

## 2021-10-07 DIAGNOSIS — K439 Ventral hernia without obstruction or gangrene: Secondary | ICD-10-CM

## 2021-10-07 DIAGNOSIS — Z992 Dependence on renal dialysis: Secondary | ICD-10-CM | POA: Diagnosis not present

## 2021-10-07 DIAGNOSIS — I120 Hypertensive chronic kidney disease with stage 5 chronic kidney disease or end stage renal disease: Secondary | ICD-10-CM | POA: Insufficient documentation

## 2021-10-07 DIAGNOSIS — R739 Hyperglycemia, unspecified: Secondary | ICD-10-CM

## 2021-10-07 DIAGNOSIS — Z4902 Encounter for fitting and adjustment of peritoneal dialysis catheter: Secondary | ICD-10-CM | POA: Insufficient documentation

## 2021-10-07 DIAGNOSIS — K429 Umbilical hernia without obstruction or gangrene: Secondary | ICD-10-CM | POA: Insufficient documentation

## 2021-10-07 DIAGNOSIS — E1122 Type 2 diabetes mellitus with diabetic chronic kidney disease: Secondary | ICD-10-CM | POA: Insufficient documentation

## 2021-10-07 DIAGNOSIS — Z794 Long term (current) use of insulin: Secondary | ICD-10-CM

## 2021-10-07 DIAGNOSIS — N186 End stage renal disease: Secondary | ICD-10-CM

## 2021-10-07 HISTORY — PX: INSERTION OF MESH: SHX5868

## 2021-10-07 HISTORY — PX: CAPD INSERTION: SHX5233

## 2021-10-07 HISTORY — PX: UMBILICAL HERNIA REPAIR: SHX196

## 2021-10-07 LAB — GLUCOSE, CAPILLARY
Glucose-Capillary: 230 mg/dL — ABNORMAL HIGH (ref 70–99)
Glucose-Capillary: 219 mg/dL — ABNORMAL HIGH (ref 70–99)
Glucose-Capillary: 253 mg/dL — ABNORMAL HIGH (ref 70–99)

## 2021-10-07 LAB — POCT I-STAT, CHEM 8
BUN: 56 mg/dL — ABNORMAL HIGH (ref 8–23)
Calcium, Ion: 1.26 mmol/L (ref 1.15–1.40)
Chloride: 108 mmol/L (ref 98–111)
Creatinine, Ser: 8.4 mg/dL — ABNORMAL HIGH (ref 0.44–1.00)
Glucose, Bld: 183 mg/dL — ABNORMAL HIGH (ref 70–99)
HCT: 26 % — ABNORMAL LOW (ref 36.0–46.0)
Hemoglobin: 8.8 g/dL — ABNORMAL LOW (ref 12.0–15.0)
Potassium: 5.1 mmol/L (ref 3.5–5.1)
Sodium: 138 mmol/L (ref 135–145)
TCO2: 20 mmol/L — ABNORMAL LOW (ref 22–32)

## 2021-10-07 SURGERY — LAPAROSCOPIC INSERTION CONTINUOUS AMBULATORY PERITONEAL DIALYSIS  (CAPD) CATHETER
Anesthesia: General

## 2021-10-07 MED ORDER — ACETAMINOPHEN 500 MG PO TABS
ORAL_TABLET | ORAL | Status: AC
  Administered 2021-10-07: 1000 mg via ORAL
  Filled 2021-10-07: qty 2

## 2021-10-07 MED ORDER — ROCURONIUM BROMIDE 100 MG/10ML IV SOLN
INTRAVENOUS | Status: DC | PRN
  Administered 2021-10-07: 50 mg via INTRAVENOUS

## 2021-10-07 MED ORDER — OXYCODONE HCL 5 MG PO TABS
ORAL_TABLET | ORAL | Status: AC
  Administered 2021-10-07: 5 mg via ORAL
  Filled 2021-10-07: qty 1

## 2021-10-07 MED ORDER — FENTANYL CITRATE (PF) 100 MCG/2ML IJ SOLN
INTRAMUSCULAR | Status: AC
  Filled 2021-10-07: qty 2

## 2021-10-07 MED ORDER — CHLORHEXIDINE GLUCONATE 0.12 % MT SOLN
OROMUCOSAL | Status: AC
  Administered 2021-10-07: 15 mL via OROMUCOSAL
  Filled 2021-10-07: qty 15

## 2021-10-07 MED ORDER — CELECOXIB 200 MG PO CAPS
ORAL_CAPSULE | ORAL | Status: AC
  Administered 2021-10-07: 200 mg via ORAL
  Filled 2021-10-07: qty 1

## 2021-10-07 MED ORDER — BUPIVACAINE LIPOSOME 1.3 % IJ SUSP
INTRAMUSCULAR | Status: DC | PRN
  Administered 2021-10-07: 20 mL

## 2021-10-07 MED ORDER — OXYCODONE HCL 5 MG/5ML PO SOLN: 5.0000 mg | Freq: Once | ORAL | Status: AC | PRN

## 2021-10-07 MED ORDER — SUGAMMADEX SODIUM 200 MG/2ML IV SOLN
INTRAVENOUS | Status: DC | PRN
  Administered 2021-10-07: 300 mg via INTRAVENOUS

## 2021-10-07 MED ORDER — GABAPENTIN 300 MG PO CAPS
ORAL_CAPSULE | ORAL | Status: AC
  Administered 2021-10-07: 300 mg via ORAL
  Filled 2021-10-07: qty 1

## 2021-10-07 MED ORDER — BUPIVACAINE-EPINEPHRINE (PF) 0.25% -1:200000 IJ SOLN
INTRAMUSCULAR | Status: AC
  Filled 2021-10-07: qty 30

## 2021-10-07 MED ORDER — INSULIN ASPART 100 UNIT/ML IJ SOLN
5.0000 [IU] | Freq: Once | INTRAMUSCULAR | Status: AC
  Administered 2021-10-07: 5 [IU] via SUBCUTANEOUS

## 2021-10-07 MED ORDER — BUPIVACAINE LIPOSOME 1.3 % IJ SUSP
INTRAMUSCULAR | Status: AC
  Filled 2021-10-07: qty 20

## 2021-10-07 MED ORDER — HEPARIN SODIUM (PORCINE) 10000 UNIT/ML IJ SOLN
INTRAMUSCULAR | Status: AC
  Filled 2021-10-07: qty 1

## 2021-10-07 MED ORDER — OXYCODONE HCL 5 MG PO TABS: 5.0000 mg | ORAL_TABLET | Freq: Once | ORAL | Status: AC | PRN

## 2021-10-07 MED ORDER — FENTANYL CITRATE (PF) 100 MCG/2ML IJ SOLN
INTRAMUSCULAR | Status: DC | PRN
Start: 2021-10-07 — End: 2021-10-07
  Administered 2021-10-07 (×2): 50 ug via INTRAVENOUS

## 2021-10-07 MED ORDER — PROPOFOL 10 MG/ML IV BOLUS
INTRAVENOUS | Status: DC | PRN
  Administered 2021-10-07: 150 mg via INTRAVENOUS

## 2021-10-07 MED ORDER — ONDANSETRON HCL 4 MG/2ML IJ SOLN
INTRAMUSCULAR | Status: DC | PRN
  Administered 2021-10-07 (×2): 4 mg via INTRAVENOUS

## 2021-10-07 MED ORDER — INSULIN ASPART 100 UNIT/ML IJ SOLN
INTRAMUSCULAR | Status: AC
  Filled 2021-10-07: qty 1

## 2021-10-07 MED ORDER — DIPHENHYDRAMINE HCL 25 MG PO CAPS
ORAL_CAPSULE | ORAL | Status: AC
  Filled 2021-10-07: qty 1

## 2021-10-07 MED ORDER — HYDROCODONE-ACETAMINOPHEN 5-325 MG PO TABS: 1.0000 | ORAL_TABLET | ORAL | 0 refills | Status: DC | PRN

## 2021-10-07 MED ORDER — BUPIVACAINE-EPINEPHRINE 0.25% -1:200000 IJ SOLN
INTRAMUSCULAR | Status: DC | PRN
  Administered 2021-10-07: 30 mL

## 2021-10-07 MED ORDER — MIDAZOLAM HCL 2 MG/2ML IJ SOLN
INTRAMUSCULAR | Status: AC
  Filled 2021-10-07: qty 2

## 2021-10-07 MED ORDER — DIPHENHYDRAMINE HCL 25 MG PO CAPS
25.0000 mg | ORAL_CAPSULE | Freq: Once | ORAL | Status: AC
Start: 2021-10-07 — End: 2021-10-07
  Administered 2021-10-07: 25 mg via ORAL

## 2021-10-07 MED ORDER — FENTANYL CITRATE (PF) 100 MCG/2ML IJ SOLN
25.0000 ug | INTRAMUSCULAR | Status: DC | PRN
  Administered 2021-10-07: 25 ug via INTRAVENOUS

## 2021-10-07 MED ORDER — SODIUM CHLORIDE 0.9 % IR SOLN
Status: DC | PRN
  Administered 2021-10-07: 1000 mL

## 2021-10-07 MED ORDER — LIDOCAINE HCL (CARDIAC) PF 100 MG/5ML IV SOSY
PREFILLED_SYRINGE | INTRAVENOUS | Status: DC | PRN
  Administered 2021-10-07: 100 mg via INTRAVENOUS

## 2021-10-07 MED ORDER — GLYCOPYRROLATE 0.2 MG/ML IJ SOLN
INTRAMUSCULAR | Status: DC | PRN
  Administered 2021-10-07: .2 mg via INTRAVENOUS

## 2021-10-07 MED ORDER — FENTANYL CITRATE (PF) 100 MCG/2ML IJ SOLN
INTRAMUSCULAR | Status: AC
  Administered 2021-10-07: 25 ug via INTRAVENOUS
  Filled 2021-10-07: qty 2

## 2021-10-07 MED ORDER — DEXAMETHASONE SODIUM PHOSPHATE 10 MG/ML IJ SOLN
INTRAMUSCULAR | Status: DC | PRN
  Administered 2021-10-07: 5 mg via INTRAVENOUS

## 2021-10-07 MED ORDER — CEFAZOLIN SODIUM-DEXTROSE 2-4 GM/100ML-% IV SOLN
INTRAVENOUS | Status: AC
  Filled 2021-10-07: qty 100

## 2021-10-07 MED ORDER — FAMOTIDINE 20 MG PO TABS
ORAL_TABLET | ORAL | Status: AC
  Administered 2021-10-07: 20 mg via ORAL
  Filled 2021-10-07: qty 1

## 2021-10-07 SURGICAL SUPPLY — 63 items
ADAPTER CATH DIALYSIS 4X8 IT L (MISCELLANEOUS) IMPLANT
ADAPTER TITANIUM MEDIONICS (MISCELLANEOUS) ×2 IMPLANT
APPLIER CLIP 11 MED OPEN (CLIP)
BIOPATCH WHT 1IN DISK W/4.0 H (GAUZE/BANDAGES/DRESSINGS) IMPLANT
BLADE CLIPPER SURG (BLADE) ×2 IMPLANT
BLADE SURG 15 STRL LF DISP TIS (BLADE) ×2 IMPLANT
BLADE SURG 15 STRL SS (BLADE) ×2
CATH EXTENDED DIALYSIS (CATHETERS) ×2 IMPLANT
CHLORAPREP W/TINT 26 (MISCELLANEOUS) ×2 IMPLANT
CLIP APPLIE 11 MED OPEN (CLIP) ×2 IMPLANT
DERMABOND ADVANCED (GAUZE/BANDAGES/DRESSINGS) ×2
DERMABOND ADVANCED .7 DNX12 (GAUZE/BANDAGES/DRESSINGS) ×2 IMPLANT
DRAPE INCISE IOBAN 66X45 STRL (DRAPES) ×2 IMPLANT
DRAPE LAPAROTOMY 77X122 PED (DRAPES) ×2 IMPLANT
DRSG TELFA 3X8 NADH STRL (GAUZE/BANDAGES/DRESSINGS) ×2 IMPLANT
ELECT CAUTERY BLADE 6.4 (BLADE) ×2 IMPLANT
ELECT CAUTERY BLADE TIP 2.5 (TIP) ×2
ELECT REM PT RETURN 9FT ADLT (ELECTROSURGICAL) ×2
ELECTRODE CAUTERY BLDE TIP 2.5 (TIP) ×2 IMPLANT
ELECTRODE REM PT RTRN 9FT ADLT (ELECTROSURGICAL) ×2 IMPLANT
GAUZE 4X4 16PLY ~~LOC~~+RFID DBL (SPONGE) ×2 IMPLANT
GLOVE BIO SURGEON STRL SZ7 (GLOVE) ×2 IMPLANT
GOWN STRL REUS W/ TWL LRG LVL3 (GOWN DISPOSABLE) ×4 IMPLANT
GOWN STRL REUS W/TWL LRG LVL3 (GOWN DISPOSABLE) ×8
GRASPER SUT TROCAR 14GX15 (MISCELLANEOUS) ×2 IMPLANT
IV NS 1000ML (IV SOLUTION) ×2
IV NS 1000ML BAXH (IV SOLUTION) ×2 IMPLANT
KIT TURNOVER KIT A (KITS) ×2 IMPLANT
MANIFOLD NEPTUNE II (INSTRUMENTS) ×2 IMPLANT
MESH VENTRALEX ST 2.5 CRC MED (Mesh General) ×2 IMPLANT
MINICAP W/POVIDONE IODINE SOL (MISCELLANEOUS) ×2 IMPLANT
NDL INSUFFLATION 14GA 120MM (NEEDLE) ×2 IMPLANT
NDL SAFETY ECLIP 18X1.5 (MISCELLANEOUS) ×2 IMPLANT
NEEDLE HYPO 22GX1.5 SAFETY (NEEDLE) ×2 IMPLANT
NEEDLE INSUFFLATION 14GA 120MM (NEEDLE) ×2 IMPLANT
NS IRRIG 500ML POUR BTL (IV SOLUTION) ×2 IMPLANT
PACK BASIN MINOR ARMC (MISCELLANEOUS) ×2 IMPLANT
PACK LAP CHOLECYSTECTOMY (MISCELLANEOUS) ×2 IMPLANT
PENCIL SMOKE EVACUATOR (MISCELLANEOUS) ×2 IMPLANT
SET CYSTO W/LG BORE CLAMP LF (SET/KITS/TRAYS/PACK) ×2 IMPLANT
SET TRANSFER 6 W/TWIST CLAMP 5 (SET/KITS/TRAYS/PACK) ×2 IMPLANT
SET TUBE SMOKE EVAC HIGH FLOW (TUBING) ×2 IMPLANT
SLEEVE Z-THREAD 5X100MM (TROCAR) ×2 IMPLANT
SPONGE DRAIN TRACH 4X4 STRL 2S (GAUZE/BANDAGES/DRESSINGS) ×2 IMPLANT
SPONGE T-LAP 18X18 ~~LOC~~+RFID (SPONGE) ×2 IMPLANT
STYLET FALLER (MISCELLANEOUS) IMPLANT
SUT ETHIBOND NAB MO 7 #0 18IN (SUTURE) ×2 IMPLANT
SUT ETHILON 3-0 FS-10 30 BLK (SUTURE) ×2
SUT MNCRL 4-0 (SUTURE) ×2
SUT MNCRL 4-0 27XMFL (SUTURE) ×2
SUT MNCRL AB 4-0 PS2 18 (SUTURE) ×2 IMPLANT
SUT VIC AB 3-0 SH 27 (SUTURE) ×2
SUT VIC AB 3-0 SH 27X BRD (SUTURE) ×2 IMPLANT
SUT VICRYL 0 AB UR-6 (SUTURE) ×4 IMPLANT
SUTURE EHLN 3-0 FS-10 30 BLK (SUTURE) ×2 IMPLANT
SUTURE MNCRL 4-0 27XMF (SUTURE) ×2 IMPLANT
SYR 20ML LL LF (SYRINGE) ×2 IMPLANT
SYR 3ML LL SCALE MARK (SYRINGE) ×2 IMPLANT
SYS KII FIOS ACCESS ABD 5X100 (TROCAR) ×2
SYSTEM KII FIOS ACES ABD 5X100 (TROCAR) ×2 IMPLANT
TRAP FLUID SMOKE EVACUATOR (MISCELLANEOUS) ×2 IMPLANT
TROCAR XCEL NON-BLD 5MMX100MML (ENDOMECHANICALS) ×2 IMPLANT
WATER STERILE IRR 500ML POUR (IV SOLUTION) ×2 IMPLANT

## 2021-10-07 NOTE — Discharge Instructions (Addendum)
Peritoneal Dialysis Catheter Placement, Care After The following information offers guidance on how to care for yourself after your procedure. Your health care provider may also give you more specific instructions. If you have problems or questions, contact your health care provider. What can I expect after the procedure? After the procedure, it is common to have some pain or discomfort in your abdomen and your incision area. You may need to wait 2 weeks after your procedure before you can start peritoneal dialysis treatment. If you need dialysis before that time, your health care provider may begin peritoneal dialysis treatment early or offer kidney dialysis treatments (hemodialysis) until you heal. Follow these instructions at home: Incision care  Follow instructions from your health care provider about how to take care of your incision or incisions. Make sure you: Wash your hands with soap and water for at least 20 seconds before and after you change your bandage (dressing). If soap and water are not available, use hand sanitizer. Change your dressing only as told by your health care provider. Your health care provider may tell you not to touch or change your dressing. Leave stitches (sutures), staples, skin glue, or adhesive strips in place. These skin closures may need to stay in place for 2 weeks or longer. If adhesive strip edges start to loosen and curl up, you may trim the loose edges. Do not remove adhesive strips completely unless your health care provider tells you to do that. Check your incision areas every day for signs of infection. If you were instructed not to touch or change your dressing, look at your dressing for signs of infection. Check for: Redness, swelling, or more pain. Fluid or blood. Warmth. Pus or a bad smell. Medicines Take over-the-counter and prescription medicines only as told by your health care provider. If you were prescribed an antibiotic medicine, use it as  told by your health care provider. Do not stop using the antibiotic even if you start to feel better. Ask your health care provider if the medicine prescribed to you requires you to avoid driving or using machinery. Driving Do not drive or ride in a car until your health care provider approves. Your seat belt could move the catheter out of position or cause irritation by rubbing on your incision. Activity  Rest and limit your activity. Do not lift anything that is heavier than 10 lb (4.5 kg), or the limit that you are told, until your health care provider says that it is safe. Return to your normal activities as told by your health care provider. Ask your health care provider what activities are safe for you. Managing constipation Your condition may cause constipation. To prevent or treat constipation, you may need to: Drink enough fluid to keep your urine pale yellow. Take over-the-counter or prescription medicines. Eat foods that are high in fiber, such as beans, whole grains, and fresh fruits and vegetables. Limit foods that are high in fat and processed sugars, such as fried or sweet foods. General instructions Do not use any products that contain nicotine or tobacco. These products include cigarettes, chewing tobacco, and vaping devices, such as e-cigarettes. If you need help quitting, ask your health care provider. Follow instructions from your health care provider about eating or drinking restrictions. Do not take baths, swim, or use a hot tub until your health care provider approves. Ask your health care provider if you may take showers. You may only be allowed to take sponge baths. Wear loose-fitting clothing that keeps   the catheter covered so that it cannot get caught on something. Keep your catheter clean and dry. Keep all follow-up visits. This is important. Contact a health care provider if: You have a fever or chills. You have warmth, redness, swelling, or more pain around an  incision. You have fluid or blood coming from an incision. You have pus or a bad smell coming from an incision. You cannot eat or drink without vomiting. Get help right away if: You have problems breathing. You are confused. You have trouble speaking. You have severe pain in your abdomen that does not get better with treatment. You have bright red blood in your stool (feces), or your stool is dark black and looks like tar. These symptoms may represent a serious problem that is an emergency. Do not wait to see if the symptoms will go away. Get medical help right away. Call your local emergency services (911 in the [place] Do not drive yourself to the hospital. Summary After the procedure, it is common to have some pain or discomfort in your abdomen, your incision area, or both. You may have to wait 2 weeks after your procedure before you can start peritoneal dialysis treatment. Check your incision area every day for signs of infection. Get medical help right away if you have severe pain in your abdomen that does not get better with treatment. This information is not intended to replace advice given to you by your health care provider. Make sure you discuss any questions you have with your health care provider. Document Revised: 09/12/2019 Document Reviewed: 09/12/2019 Elsevier Patient Education  [place]   The drugs that you were given will stay in your system until tomorrow so for the next 24 hours you should not:  Drive an automobile Make any legal decisions Drink any alcoholic beverage   You may resume regular meals tomorrow.  Today it is better to start with liquids and gradually work up to solid foods.  You may eat anything you prefer, but it is better to start with liquids, then soup and crackers, and gradually work up to solid foods.   Please notify your doctor immediately if you have any unusual bleeding, trouble  breathing, redness and pain at the surgery site, drainage, fever, or pain not relieved by medication.    Additional Instructions:  THIS RN SPOKE WITH RHOANNA AT [facility] TO SET UP FLUSHES AND TEACHING PER DR PABON.  PER RHOANNA THEY WILL CALL PT TO SET UP TIMES TO BEGIN THIS.  [phone] WAS NUMBER CALLED TO REACH THEM.        Please contact your physician with any problems or Same Day Surgery at [phone], Monday through Friday 6 am to 4 pm, or [place] at [facility] number at [phone].

## 2021-10-07 NOTE — Interval H&P Note (Signed)
History and Physical Interval Note:  10/07/2021 12:00 PM  Heidi [name]  has presented today for surgery, with the diagnosis of ESRD N18.6.  The various methods of treatment have been discussed with the patient and family. After consideration of risks, benefits and other options for treatment, the patient has consented to  Procedure(s): [place]  (CAPD) CATHETER (N/A) HERNIA REPAIR UMBILICAL ADULT (N/A) as a surgical intervention.  The patient's history has been reviewed, patient examined, no change in status, stable for surgery.  I have reviewed the patient's chart and labs.  Questions were answered to the patient's satisfaction.     [place]

## 2021-10-07 NOTE — Transfer of Care (Signed)
Immediate Anesthesia Transfer of Care Note  Patient: Heidi [name]  Procedure(s) Performed: LAPAROSCOPIC INSERTION CONTINUOUS AMBULATORY PERITONEAL DIALYSIS  (CAPD) CATHETER HERNIA REPAIR UMBILICAL ADULT INSERTION OF MESH  Patient Location: PACU  Anesthesia Type:General  Level of Consciousness: awake, drowsy and patient cooperative  Airway & Oxygen Therapy: Patient Spontanous Breathing and Patient connected to face mask oxygen  Post-op Assessment: Report given to RN and Post -op Vital signs reviewed and stable  Post vital signs: Reviewed and stable  Last Vitals:  Vitals Value Taken Time  BP 162/76 10/07/21 1425  Temp    Pulse 82 10/07/21 1430  Resp 22 10/07/21 1430  SpO2 100 % 10/07/21 1430  Vitals shown include unvalidated device data.  Last Pain:  Vitals:   10/07/21 1132  TempSrc: Temporal  PainSc: 0-No pain         Complications: No notable events documented.

## 2021-10-07 NOTE — Op Note (Signed)
Laparoscopic placement of peritoneal Dialysis catheter ( Total three cuffs) Laparoscopic Omentopexy Open Umbilical hernia repair using ventralex 6.4 cms   Pre-operative Diagnosis: ESRD   Post-operative Diagnosis: same     Surgeon: Caroleen Hamman, MD FACS   Anesthesia: Gen. with endotracheal tube      Findings: Umbilical hernia 3 cm Catheter within pelvis Good return after infusing Peritoneal cavity   Estimated Blood Loss: 15cc              Complications: none     Procedure Details  The patient was seen again in the Holding Room. The benefits, complications, treatment options, and expected outcomes were discussed with the patient. The risks of bleeding, infection, recurrence of symptoms, failure to resolve symptoms, catheter malfunction bowel injury, any of which could require further surgery were reviewed with the patient. The likelihood of improving the patient's symptoms with return to their baseline status is good.  The patient and/or family concurred with the proposed plan, giving informed consent.  The patient was taken to Operating Room, identified and the procedure verified. A Time Out was held and the above information confirmed.   Prior to the induction of general anesthesia, antibiotic prophylaxis was administered. VTE prophylaxis was in place. General endotracheal anesthesia was then administered and tolerated well. After the induction, the abdomen was prepped with Chloraprep and draped in the sterile fashion. The patient was positioned in the supine position.   Periumbilical incision created and hernia sac identified and excised. Fascial edges were cleaned. Defect measured. I choose 6.4 ventralex patch so we had appropiate coverage. It was secured to the abdominal wall in 4 quadrants ausing 0 ethibonds. The defect was closed using multiple 0 ethibonds.   The anterior rectus fascia identified and incised, rectus muscle identified and retracted laterally, using a port We were  able to tunnel into the retro rectus space for about 6 cms. Peritoneum was pierced off the midline. Pneumoperitoneum was obtained w/o hemodynamic changes. We placed two additional laparoscopic ports under direct visualization.   We were able to thread the catheter via the laparoscopic port within the retrorectus space in the standard fashion.  Under direct visualization we made sure that the coil portion of the catheter layed within the pelvis without any kinks.  W I was able to also perform a counterincision in the left subcostal area and Lovena Le an additional extension of the catheter . The  Extension had 2 cuffs and I was able to connect it to parse together with the titanium connector in the standard fashion.  There was no evidence of any kinks.  The exit site was to the left side. We instilled heparinized saline a liter into the pelvis and we had very good return. He did have generous omentum, attention was then turned to the omentum and using a PMI device we were able to perform an omentopexy and tacked the omentum to the abdominal wall using 2 interrupted 2-0 Vicryl sutures in the standard fashion. All skin incisions  were infiltrated with a liposomal Marcaine. 4-0 subcuticular Monocryl was used to close the skin. Dermabond was  applied. Sterile dressing applied to the catheter. The patient was then extubated and brought to the recovery room in stable condition. Sponge, lap, and needle counts were correct at closure and at the conclusion of the case.               Caroleen Hamman, MD, FACS

## 2021-10-07 NOTE — Anesthesia Preprocedure Evaluation (Signed)
Anesthesia Evaluation  Patient identified by MRN, date of birth, ID band Patient awake    Reviewed: Allergy & Precautions, NPO status , Patient's Chart, lab work & pertinent test results  History of Anesthesia Complications Negative for: history of anesthetic complications  Airway Mallampati: III  TM Distance: >3 FB Neck ROM: full    Dental  (+) Dental Advidsory Given, Missing, Upper Dentures, Lower Dentures   Pulmonary neg shortness of breath, asthma ,    Pulmonary exam normal        Cardiovascular hypertension, (-) angina(-) Past MI and (-) CABG negative cardio ROS Normal cardiovascular exam(-) dysrhythmias      Neuro/Psych negative neurological ROS  negative psych ROS   GI/Hepatic negative GI ROS, Neg liver ROS,   Endo/Other  negative endocrine ROSdiabetes  Renal/GU ESRFRenal disease     Musculoskeletal   Abdominal   Peds  Hematology negative hematology ROS (+)   Anesthesia Other Findings Past Medical History: No date: Allergic rhinitis No date: Arthritis     Comment:  sacroiliac joint No date: Asthma 09/05/2014: Breast mass in female 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 No date: Diabetes mellitus without complication (HCC) 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, congenital No date: GERD (gastroesophageal reflux disease) No date: Hyperlipidemia No date: Hypertension No date: Hyponatremia No date: Microalbuminuria No date: Pes planus No date: Renal disorder     Comment:  CKD stage 3 No date: Tricompartment degenerative joint disease of knee No date: Trochanteric bursitis of both hips No date: [facility])  Past Surgical History: No date: ABDOMINAL HYSTERECTOMY 07/24/2014: BREAST BIOPSY; Left     Comment:  PASH 1996: BREAST BIOPSY; Right     Comment:  benign 2016: BREAST EXCISIONAL BIOPSY; Right 02/2021: broken ankle surgery; Right No date: COLONOSCOPY 12/19/2019: COLONOSCOPY; N/A     Comment:   Procedure: COLONOSCOPY;  Surgeon: Toledo, Benay Pike, MD;              Location: [facility] ENDOSCOPY;  Service: Gastroenterology;                Laterality: N/A; No date: EXPLORATORY LAPAROTOMY     Comment:  of colon for torsion No date: NASAL FRACTURE SURGERY  BMI    Body Mass Index: 32.44 kg/m      Reproductive/Obstetrics negative OB ROS                             Anesthesia Physical Anesthesia Plan  ASA: 3  Anesthesia Plan: General   Post-op Pain Management:    Induction: Intravenous  PONV Risk Score and Plan: 4 or greater and Ondansetron, Dexamethasone and Midazolam  Airway Management Planned: Oral ETT  Additional Equipment:   Intra-op Plan:   Post-operative Plan: Extubation in OR  Informed Consent: I have reviewed the patients History and Physical, chart, labs and discussed the procedure including the risks, benefits and alternatives for the proposed anesthesia with the patient or authorized representative who has indicated his/her understanding and acceptance.     Dental Advisory Given  Plan Discussed with: Anesthesiologist, CRNA and Surgeon  Anesthesia Plan Comments: (Patient consented for risks of anesthesia including but not limited to:  - adverse reactions to medications - damage to eyes, teeth, lips or other oral mucosa - nerve damage due to positioning  - sore throat or hoarseness - Damage to heart, brain, nerves, lungs, other parts of body or loss of life  Patient voiced understanding.)  Anesthesia Quick Evaluation  

## 2021-10-07 NOTE — Anesthesia Procedure Notes (Signed)
Procedure Name: Intubation Date/Time: 10/07/2021 12:43 PM  Performed by: Kelton Pillar, CRNAPre-anesthesia Checklist: Patient identified, Emergency Drugs available, Suction available and Patient being monitored Patient Re-evaluated:Patient Re-evaluated prior to induction Oxygen Delivery Method: Circle system utilized Preoxygenation: Pre-oxygenation with 100% oxygen Induction Type: IV induction Ventilation: Mask ventilation without difficulty Laryngoscope Size: McGraph and 3 Grade View: Grade I Tube type: Oral Tube size: 6.5 mm Number of attempts: 1 Airway Equipment and Method: Stylet and Oral airway Placement Confirmation: ETT inserted through vocal cords under direct vision, positive ETCO2, breath sounds checked- equal and bilateral and CO2 detector Secured at: 21 cm Tube secured with: Tape Dental Injury: Teeth and Oropharynx as per pre-operative assessment

## 2021-10-08 ENCOUNTER — Encounter: Payer: Self-pay | Admitting: Surgery

## 2021-10-08 NOTE — Anesthesia Postprocedure Evaluation (Signed)
Anesthesia Post Note  Patient: Heidi [name]  Procedure(s) Performed: LAPAROSCOPIC INSERTION CONTINUOUS AMBULATORY PERITONEAL DIALYSIS  (CAPD) CATHETER HERNIA REPAIR UMBILICAL ADULT INSERTION OF MESH  Patient location during evaluation: PACU Anesthesia Type: General Level of consciousness: awake and alert Pain management: pain level controlled Vital Signs Assessment: post-procedure vital signs reviewed and stable Respiratory status: spontaneous breathing, nonlabored ventilation, respiratory function stable and patient connected to nasal cannula oxygen Cardiovascular status: blood pressure returned to baseline and stable Postop Assessment: no apparent nausea or vomiting Anesthetic complications: no   No notable events documented.   Last Vitals:  Vitals:   10/07/21 1515 10/07/21 1537  BP: 137/75 (!) 140/78  Pulse: 69 80  Resp: 15 16  Temp: 36.6 C 36.9 C  SpO2: 95% 94%    Last Pain:  Vitals:   10/07/21 1537  TempSrc: Temporal  PainSc: [place]

## 2021-10-12 LAB — SURGICAL PATHOLOGY

## 2021-10-25 ENCOUNTER — Encounter: Payer: Medicare HMO | Admitting: Surgery

## 2021-11-03 ENCOUNTER — Ambulatory Visit (INDEPENDENT_AMBULATORY_CARE_PROVIDER_SITE_OTHER): Payer: Medicare HMO | Admitting: Surgery

## 2021-11-03 ENCOUNTER — Other Ambulatory Visit: Payer: Self-pay

## 2021-11-03 ENCOUNTER — Encounter: Payer: Self-pay | Admitting: Surgery

## 2021-11-03 VITALS — BP 158/83 | HR 97 | Temp 98.5°F | Ht 64.0 in | Wt 185.0 lb

## 2021-11-03 DIAGNOSIS — Z09 Encounter for follow-up examination after completed treatment for conditions other than malignant neoplasm: Secondary | ICD-10-CM | POA: Diagnosis not present

## 2021-11-03 DIAGNOSIS — K429 Umbilical hernia without obstruction or gangrene: Secondary | ICD-10-CM | POA: Diagnosis not present

## 2021-11-03 NOTE — Progress Notes (Signed)
Heidi [name] is 3-1/2 weeks from laparoscopic PD catheter.  She had no complications.  She was already started some flushes.  No fevers no chills.  She will start formal dialysis next week  PE: NAD ABd: soft, incisions c/d/I PD cath in place no infection  A/P Doing well w/o complications RTC prn

## 2021-11-03 NOTE — Patient Instructions (Addendum)
Please call the office with any questions or concerns. Follow up as needed.    Peritoneal Dialysis Catheter Placement, Care After The following information offers guidance on how to care for yourself after your procedure. Your health care provider may also give you more specific instructions. If you have problems or questions, contact your health care provider. What can I expect after the procedure? After the procedure, it is common to have some pain or discomfort in your abdomen and your incision area. You may need to wait 2 weeks after your procedure before you can start peritoneal dialysis treatment. If you need dialysis before that time, your health care provider may begin peritoneal dialysis treatment early or offer kidney dialysis treatments (hemodialysis) until you heal. Follow these instructions at home: Incision care  Follow instructions from your health care provider about how to take care of your incision or incisions. Make sure you: Wash your hands with soap and water for at least 20 seconds before and after you change your bandage (dressing). If soap and water are not available, use hand sanitizer. Change your dressing only as told by your health care provider. Your health care provider may tell you not to touch or change your dressing. Leave stitches (sutures), staples, skin glue, or adhesive strips in place. These skin closures may need to stay in place for 2 weeks or longer. If adhesive strip edges start to loosen and curl up, you may trim the loose edges. Do not remove adhesive strips completely unless your health care provider tells you to do that. Check your incision areas every day for signs of infection. If you were instructed not to touch or change your dressing, look at your dressing for signs of infection. Check for: Redness, swelling, or more pain. Fluid or blood. Warmth. Pus or a bad smell. Medicines Take over-the-counter and prescription medicines only as told by your  health care provider. If you were prescribed an antibiotic medicine, use it as told by your health care provider. Do not stop using the antibiotic even if you start to feel better. Ask your health care provider if the medicine prescribed to you requires you to avoid driving or using machinery. Driving Do not drive or ride in a car until your health care provider approves. Your seat belt could move the catheter out of position or cause irritation by rubbing on your incision. Activity  Rest and limit your activity. Do not lift anything that is heavier than 10 lb (4.5 kg), or the limit that you are told, until your health care provider says that it is safe. Return to your normal activities as told by your health care provider. Ask your health care provider what activities are safe for you. Managing constipation Your condition may cause constipation. To prevent or treat constipation, you may need to: Drink enough fluid to keep your urine pale yellow. Take over-the-counter or prescription medicines. Eat foods that are high in fiber, such as beans, whole grains, and fresh fruits and vegetables. Limit foods that are high in fat and processed sugars, such as fried or sweet foods. General instructions Do not use any products that contain nicotine or tobacco. These products include cigarettes, chewing tobacco, and vaping devices, such as e-cigarettes. If you need help quitting, ask your health care provider. Follow instructions from your health care provider about eating or drinking restrictions. Do not take baths, swim, or use a hot tub until your health care provider approves. Ask your health care provider if you may  take showers. You may only be allowed to take sponge baths. Wear loose-fitting clothing that keeps the catheter covered so that it cannot get caught on something. Keep your catheter clean and dry. Keep all follow-up visits. This is important. Contact a health care provider if: You have a  fever or chills. You have warmth, redness, swelling, or more pain around an incision. You have fluid or blood coming from an incision. You have pus or a bad smell coming from an incision. You cannot eat or drink without vomiting. Get help right away if: You have problems breathing. You are confused. You have trouble speaking. You have severe pain in your abdomen that does not get better with treatment. You have bright red blood in your stool (feces), or your stool is dark black and looks like tar. These symptoms may represent a serious problem that is an emergency. Do not wait to see if the symptoms will go away. Get medical help right away. Call your local emergency services (911 in the [place] Do not drive yourself to the hospital. Summary After the procedure, it is common to have some pain or discomfort in your abdomen, your incision area, or both. You may have to wait 2 weeks after your procedure before you can start peritoneal dialysis treatment. Check your incision area every day for signs of infection. Get medical help right away if you have severe pain in your abdomen that does not get better with treatment. This information is not intended to replace advice given to you by your health care provider. Make sure you discuss any questions you have with your health care provider. Document Revised: 09/12/2019 Document Reviewed: 09/12/2019 Elsevier Patient Education  2023 [employer]  

## 2021-12-02 DIAGNOSIS — G4700 Insomnia, unspecified: Secondary | ICD-10-CM | POA: Insufficient documentation

## 2021-12-02 DIAGNOSIS — E559 Vitamin D deficiency, unspecified: Secondary | ICD-10-CM | POA: Insufficient documentation

## 2022-02-15 DIAGNOSIS — R768 Other specified abnormal immunological findings in serum: Secondary | ICD-10-CM | POA: Insufficient documentation

## 2022-03-08 DIAGNOSIS — Z01810 Encounter for preprocedural cardiovascular examination: Secondary | ICD-10-CM | POA: Insufficient documentation

## 2022-05-17 DIAGNOSIS — D8481 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: Secondary | ICD-10-CM | POA: Insufficient documentation

## 2022-05-17 DIAGNOSIS — D649 Anemia, unspecified: Secondary | ICD-10-CM | POA: Insufficient documentation

## 2022-05-17 DIAGNOSIS — Z794 Long term (current) use of insulin: Secondary | ICD-10-CM | POA: Insufficient documentation

## 2022-05-17 DIAGNOSIS — D631 Anemia in chronic kidney disease: Secondary | ICD-10-CM | POA: Insufficient documentation

## 2022-05-17 DIAGNOSIS — I120 Hypertensive chronic kidney disease with stage 5 chronic kidney disease or end stage renal disease: Secondary | ICD-10-CM | POA: Insufficient documentation

## 2022-05-17 DIAGNOSIS — Z992 Dependence on renal dialysis: Secondary | ICD-10-CM | POA: Insufficient documentation

## 2022-06-13 ENCOUNTER — Other Ambulatory Visit: Payer: Self-pay | Admitting: Family Medicine

## 2022-06-13 DIAGNOSIS — Z1231 Encounter for screening mammogram for malignant neoplasm of breast: Secondary | ICD-10-CM

## 2022-06-30 ENCOUNTER — Ambulatory Visit
Admission: RE | Admit: 2022-06-30 | Discharge: 2022-06-30 | Disposition: A | Discharge status: Home / Self Care | Payer: Medicare HMO | Source: Ambulatory Visit | Attending: Family Medicine | Admitting: Family Medicine

## 2022-06-30 DIAGNOSIS — Z1231 Encounter for screening mammogram for malignant neoplasm of breast: Secondary | ICD-10-CM | POA: Diagnosis present

## 2022-07-05 ENCOUNTER — Other Ambulatory Visit: Payer: Self-pay | Admitting: Family Medicine

## 2022-07-05 DIAGNOSIS — R928 Other abnormal and inconclusive findings on diagnostic imaging of breast: Secondary | ICD-10-CM

## 2022-07-05 DIAGNOSIS — R921 Mammographic calcification found on diagnostic imaging of breast: Secondary | ICD-10-CM

## 2022-07-07 ENCOUNTER — Ambulatory Visit
Admission: RE | Admit: 2022-07-07 | Discharge: 2022-07-07 | Disposition: A | Discharge status: Home / Self Care | Payer: Medicare HMO | Source: Ambulatory Visit | Attending: Family Medicine | Admitting: Family Medicine

## 2022-07-07 DIAGNOSIS — R928 Other abnormal and inconclusive findings on diagnostic imaging of breast: Secondary | ICD-10-CM | POA: Diagnosis not present

## 2022-07-07 DIAGNOSIS — R921 Mammographic calcification found on diagnostic imaging of breast: Secondary | ICD-10-CM | POA: Diagnosis present

## 2022-07-08 ENCOUNTER — Other Ambulatory Visit: Payer: Self-pay | Admitting: Family Medicine

## 2022-07-08 DIAGNOSIS — R928 Other abnormal and inconclusive findings on diagnostic imaging of breast: Secondary | ICD-10-CM

## 2022-07-08 DIAGNOSIS — R921 Mammographic calcification found on diagnostic imaging of breast: Secondary | ICD-10-CM

## 2022-07-13 ENCOUNTER — Ambulatory Visit
Admission: RE | Admit: 2022-07-13 | Discharge: 2022-07-13 | Disposition: A | Discharge status: Home / Self Care | Payer: Medicare HMO | Source: Ambulatory Visit | Attending: Family Medicine | Admitting: Family Medicine

## 2022-07-13 DIAGNOSIS — R921 Mammographic calcification found on diagnostic imaging of breast: Secondary | ICD-10-CM

## 2022-07-13 DIAGNOSIS — R928 Other abnormal and inconclusive findings on diagnostic imaging of breast: Secondary | ICD-10-CM | POA: Insufficient documentation

## 2022-07-13 HISTORY — PX: BREAST BIOPSY: SHX20

## 2022-07-13 MED ORDER — LIDOCAINE HCL (PF) 1 % IJ SOLN
5.0000 mL | Freq: Once | INTRAMUSCULAR | Status: AC
  Administered 2022-07-13: 5 mL
  Filled 2022-07-13: qty 5

## 2022-07-13 MED ORDER — LIDOCAINE-EPINEPHRINE 1 %-1:100000 IJ SOLN
20.0000 mL | Freq: Once | INTRAMUSCULAR | Status: AC
  Administered 2022-07-13: 20 mL
  Filled 2022-07-13: qty 20

## 2022-07-15 LAB — SURGICAL PATHOLOGY

## 2022-11-16 DIAGNOSIS — J42 Unspecified chronic bronchitis: Secondary | ICD-10-CM | POA: Insufficient documentation

## 2022-11-16 DIAGNOSIS — Z992 Dependence on renal dialysis: Secondary | ICD-10-CM | POA: Insufficient documentation

## 2022-11-29 ENCOUNTER — Encounter: Payer: Self-pay | Admitting: Podiatry

## 2022-11-29 ENCOUNTER — Ambulatory Visit (INDEPENDENT_AMBULATORY_CARE_PROVIDER_SITE_OTHER): Payer: Medicare HMO

## 2022-11-29 ENCOUNTER — Ambulatory Visit (INDEPENDENT_AMBULATORY_CARE_PROVIDER_SITE_OTHER): Payer: Medicare HMO | Admitting: Podiatry

## 2022-11-29 DIAGNOSIS — M7751 Other enthesopathy of right foot: Secondary | ICD-10-CM | POA: Diagnosis not present

## 2022-11-29 DIAGNOSIS — E119 Type 2 diabetes mellitus without complications: Secondary | ICD-10-CM

## 2022-11-29 MED ORDER — BETAMETHASONE SOD PHOS & ACET 6 (3-3) MG/ML IJ SUSP
3.0000 mg | Freq: Once | INTRAMUSCULAR | Status: AC
Start: 2022-11-29 — End: 2022-11-29
  Administered 2022-11-29: 3 mg via INTRA_ARTICULAR
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
## 2022-12-21 ENCOUNTER — Inpatient Hospital Stay: Payer: Medicare HMO | Attending: Internal Medicine | Admitting: Internal Medicine

## 2022-12-21 ENCOUNTER — Inpatient Hospital Stay: Payer: Medicare HMO

## 2022-12-21 ENCOUNTER — Encounter: Payer: Self-pay | Admitting: Internal Medicine

## 2022-12-21 DIAGNOSIS — Z794 Long term (current) use of insulin: Secondary | ICD-10-CM | POA: Insufficient documentation

## 2022-12-21 DIAGNOSIS — Z803 Family history of malignant neoplasm of breast: Secondary | ICD-10-CM | POA: Insufficient documentation

## 2022-12-21 DIAGNOSIS — I2510 Atherosclerotic heart disease of native coronary artery without angina pectoris: Secondary | ICD-10-CM | POA: Diagnosis not present

## 2022-12-21 DIAGNOSIS — I120 Hypertensive chronic kidney disease with stage 5 chronic kidney disease or end stage renal disease: Secondary | ICD-10-CM | POA: Diagnosis present

## 2022-12-21 DIAGNOSIS — D509 Iron deficiency anemia, unspecified: Secondary | ICD-10-CM | POA: Insufficient documentation

## 2022-12-21 DIAGNOSIS — K219 Gastro-esophageal reflux disease without esophagitis: Secondary | ICD-10-CM | POA: Diagnosis not present

## 2022-12-21 DIAGNOSIS — M7989 Other specified soft tissue disorders: Secondary | ICD-10-CM | POA: Diagnosis not present

## 2022-12-21 DIAGNOSIS — N186 End stage renal disease: Secondary | ICD-10-CM | POA: Diagnosis not present

## 2022-12-21 DIAGNOSIS — Z992 Dependence on renal dialysis: Secondary | ICD-10-CM | POA: Diagnosis not present

## 2022-12-21 DIAGNOSIS — D631 Anemia in chronic kidney disease: Secondary | ICD-10-CM | POA: Diagnosis not present

## 2022-12-21 DIAGNOSIS — Z79899 Other long term (current) drug therapy: Secondary | ICD-10-CM | POA: Diagnosis not present

## 2022-12-21 DIAGNOSIS — E785 Hyperlipidemia, unspecified: Secondary | ICD-10-CM | POA: Insufficient documentation

## 2022-12-21 DIAGNOSIS — R5383 Other fatigue: Secondary | ICD-10-CM | POA: Diagnosis not present

## 2022-12-21 DIAGNOSIS — E1122 Type 2 diabetes mellitus with diabetic chronic kidney disease: Secondary | ICD-10-CM | POA: Insufficient documentation

## 2022-12-21 DIAGNOSIS — D649 Anemia, unspecified: Secondary | ICD-10-CM | POA: Diagnosis not present

## 2022-12-21 DIAGNOSIS — J45909 Unspecified asthma, uncomplicated: Secondary | ICD-10-CM | POA: Insufficient documentation

## 2022-12-21 DIAGNOSIS — K909 Intestinal malabsorption, unspecified: Secondary | ICD-10-CM | POA: Insufficient documentation

## 2022-12-21 NOTE — Assessment & Plan Note (Addendum)
#   Chronic anemia-normocytic recently getting worse.  Patient is quite symptomatic from anemia. The etiology is likely ESRD- .   I had a long discussion with patient regarding multiple other etiologies of anemia-including nutritional; malabsorption; primary bone marrow disorders etc. however ESRD/iron deficiency seems the cause of patient's anemia.  # I discussed regarding ordering labs today however patient stated she recently had blood work done with dialysis yesterday/cephalexin for blood draw today.  In general I would recommend CBC CMP LDH peripheral smear; haptoglobin; erythropoietin; iron studies ferritin B12 folic acid; reticulocyte count; hepatitis panel;  multiple myeloma panel.  Kappa lambda light chain ratio.  Given the patient blood counts and to given the obvious cause of patient's anemia I would recommend holding off blood test today.  I left a message for Dr. Lourdes Sledge to discuss future blood work.  HOLD of bone marrow Biopsy at this time; pending above work up.   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  Left a message for Dr. Cherylann Ratel.  Thank you Dr. Greggory Stallion for allowing me to participate in the care of your pleasant patient. Please do not hesitate to contact me with questions or concerns in the interim.  # DISPOSITION: # No labs today # follow up TBD-- Dr.B  Addendum: Discussed with Dr. Lourdes Sledge, patient not been on EPO agent given the concerns for RCC as noted on the scan in May 2024.  In the past patient declined any further workup.  CT scan: [facility]- MAY 2024- Impression:  1. 1.9 cm right adrenal nodule. Absolute washout of greater than 60% and  relative washout of greater than 40% are both consistent with adenoma.  2.  Polycystic kidneys with bilateral renal cysts, some of which  demonstrate internal enhancement concerning for renal cell carcinoma.  Kidneys were better evaluated on the 04/19/2022 RCC protocol CT.   Discussed with Dr.Lateef- re: options-workup for  renal mass-including further imaging/biopsy.  Versus proceeding with EPO agents-taking workup for underlying positive malignancy.  Spoke to patient-patient is currently open to option of further workup including scan to rule out malignancy. Will initiate work up- [facility]

## 2022-12-21 NOTE — Progress Notes (Signed)
[facility] CONSULT NOTE  Patient Care Team: Rayetta Humphrey, MD as PCP - General (Family Medicine) Rayetta Humphrey, MD (Family Medicine) Earna Coder, MD as Consulting Physician (Oncology)  CHIEF COMPLAINTS/PURPOSE OF CONSULTATION: ANEMIA   HEMATOLOGY HISTORY  # ANEMIA[Hb; MCV-platelets- WBC; Iron sat; ferritin;  GFR- CT/US; EGD/colonoscopy-  # CKD- [Dr.]  HISTORY OF PRESENTING ILLNESS: Patient ambulating- with assistance in wheel chair.  Alone. Walks with cane.   Heidi [name] 67 y.o.  female pleasant patient with end-stage liver disease, CAD/diabetes on peritoneal dialysis [since 2023 September] was been referred to [place] for further evaluation of anemia.  Patient admits to chronic fatigue.  Chronic mild swelling of the legs.   Blood in stools: none; colonoscopy- ? 2023 Blood in urine: none Difficulty swallowing:none Change of bowel movement/constipation: none Prior blood transfusion:none  Liver disease: none Alcohol: none Bariatric surgery:none  Vaginal bleeding: none Prior evaluation with hematology:none Prior bone marrow biopsy:  none Prior IV iron infusions: with nephrology- on  monthly basis.    Review of Systems  Constitutional:  Positive for malaise/fatigue. Negative for chills, diaphoresis, fever and weight loss.  HENT:  Negative for nosebleeds and sore throat.   Eyes:  Negative for double vision.  Respiratory:  Negative for cough, hemoptysis, sputum production, shortness of breath and wheezing.   Cardiovascular:  Negative for chest pain, palpitations, orthopnea and leg swelling.  Gastrointestinal:  Negative for abdominal pain, blood in stool, constipation, diarrhea, heartburn, melena, nausea and vomiting.  Genitourinary:  Negative for dysuria, frequency and urgency.  Musculoskeletal:  Positive for joint pain. Negative for back pain.  Skin: Negative.  Negative for itching and rash.  Neurological:  Negative for dizziness, tingling,  focal weakness, weakness and headaches.  Endo/Heme/Allergies:  Does not bruise/bleed easily.  Psychiatric/Behavioral:  Negative for depression. The patient is not nervous/anxious and does not have insomnia.      MEDICAL HISTORY:  Past Medical History:  Diagnosis Date   Allergic rhinitis    Arthritis    sacroiliac joint   Asthma    Breast mass in female 09/05/2014   Degenerative arthritis of lumbar spine    Diabetes mellitus without complication (HCC)    Genu valgus, congenital    GERD (gastroesophageal reflux disease)    Hyperlipidemia    Hypertension    Hyponatremia    Microalbuminuria    Pes planus    Renal disorder    CKD stage 3   Tricompartment degenerative joint disease of knee    Trochanteric bursitis of both hips    Volvulus (HCC)     SURGICAL HISTORY: Past Surgical History:  Procedure Laterality Date   ABDOMINAL HYSTERECTOMY     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   BREAST BIOPSY Right 1996   benign   BREAST BIOPSY Right 07/13/2022   stereo biopsy/ x clip/ path pending   BREAST BIOPSY Right 07/13/2022   MM RT BREAST BX W LOC DEV 1ST LESION IMAGE BX SPEC STEREO GUIDE 07/13/2022 [facility]   BREAST EXCISIONAL BIOPSY Right 2016   broken ankle surgery Right 02/2021   CAPD INSERTION N/A 10/07/2021   Procedure: LAPAROSCOPIC INSERTION CONTINUOUS AMBULATORY PERITONEAL DIALYSIS  (CAPD) CATHETER;  Surgeon: Leafy Ro, MD;  Location: [facility];  Service: General;  Laterality: N/A;   COLONOSCOPY     COLONOSCOPY N/A 12/19/2019   Procedure: COLONOSCOPY;  Surgeon: Toledo, Boykin Nearing, MD;  Location: [facility];  Service: Gastroenterology;  Laterality: N/A;   EXPLORATORY LAPAROTOMY  of colon for torsion   INSERTION OF MESH  10/07/2021   Procedure: INSERTION OF MESH;  Surgeon: Leafy Ro, MD;  Location: [facility];  Service: General;;   NASAL FRACTURE SURGERY     UMBILICAL HERNIA REPAIR N/A 10/07/2021   Procedure: HERNIA REPAIR UMBILICAL ADULT;  Surgeon:  Leafy Ro, MD;  Location: [facility];  Service: General;  Laterality: N/A;    SOCIAL HISTORY: Social History   Socioeconomic History   Marital status: Single    Spouse name: Not on file   Number of children: Not on file   Years of education: Not on file   Highest education level: Not on file  Occupational History   Not on file  Tobacco Use   Smoking status: Never   Smokeless tobacco: Never  Vaping Use   Vaping status: Never Used  Substance and Sexual Activity   Alcohol use: Yes    Comment: rarely   Drug use: No   Sexual activity: Not on file  Other Topics Concern   Not on file  Social History Narrative   Not on file   Social Determinants of Health   Financial Resource Strain: Low Risk  (10/11/2022)   Received from [facility]   Overall Financial Resource Strain (CARDIA)    Difficulty of Paying Living Expenses: Not hard at all  Food Insecurity: No Food Insecurity (10/11/2022)   Received from [facility]   Hunger Vital Sign    Worried About Running Out of Food in the Last Year: Never true    Ran Out of Food in the Last Year: Never true  Transportation Needs: No Transportation Needs (10/11/2022)   Received from [facility] - Transportation    In the past 12 months, has lack of transportation kept you from medical appointments or from getting medications?: No    Lack of Transportation (Non-Medical): No  Physical Activity: Inactive (03/10/2021)   [facility] from [facility], [facility]   Exercise Vital Sign    Days of Exercise per Week: 0 days    Minutes of Exercise per Session: 0 min  Stress: No Stress Concern Present (03/10/2021)   Received from [facility], [facility]   [employer] of Occupational Health - Occupational Stress Questionnaire    Feeling of Stress : Only a little  Social Connections: Not on file  Intimate Partner  Violence: Not on file    FAMILY HISTORY: Family History  Problem Relation Age of Onset   Diabetes Mother    Diabetes Father    Cancer Father    Breast cancer Maternal Aunt     ALLERGIES:  is allergic to sulfa antibiotics and misc. sulfonamide containing compounds.  MEDICATIONS:  Current Outpatient Medications  Medication Sig Dispense Refill   albuterol (VENTOLIN HFA) 108 (90 Base) MCG/ACT inhaler Inhale into the lungs every 6 (six) hours as needed for wheezing or shortness of breath.     calcitRIOL (ROCALTROL) 0.25 MCG capsule Take 0.25 mcg by mouth daily.     carvedilol (COREG) 12.5 MG tablet Take 12.5 mg by mouth in the morning, at noon, and at bedtime.     cloNIDine (CATAPRES) 0.1 MG tablet Take 0.1 mg by mouth 2 (two) times daily.     fluticasone (FLONASE) 50 MCG/ACT nasal spray Place into both nostrils daily.     hydrALAZINE (APRESOLINE) 25 MG tablet Take 25 mg by mouth 2 times  daily at 12 noon and 4 pm.     Lancets (ONETOUCH DELICA PLUS LANCET30G) MISC      losartan (COZAAR) 100 MG tablet Take by mouth.     NOVOLIN 70/30 (70-30) 100 UNIT/ML injection Inject into the skin.     NOVOLIN N 100 UNIT/ML injection Taking 20 units in am and 15 units at bedtime  0   PRECISION QID TEST test strip 1 each (1 strip total) 3 (three) times daily Use as instructed.     Vitamin D, Ergocalciferol, (DRISDOL) 1.25 MG (50000 UNIT) CAPS capsule Take 50,000 Units by mouth every 7 (seven) days.     amLODipine (NORVASC) 10 MG tablet Take 10 mg by mouth daily.     lovastatin (MEVACOR) 20 MG tablet Take by mouth.     metoprolol tartrate (LOPRESSOR) 100 MG tablet Take 100 mg by mouth 2 (two) times daily.     No current facility-administered medications for this visit.     PHYSICAL EXAMINATION:   Vitals:   12/21/22 1349 12/21/22 1404  BP: (!) 158/71 (!) 159/74  Pulse: 94   Temp: 98.6 F (37 C)   SpO2: 98%    Filed Weights   12/21/22 1349  Weight: 211 lb (95.7 kg)    Physical  Exam Vitals and nursing note reviewed.  Constitutional:      Comments:     HENT:     Head: Normocephalic and atraumatic.     Mouth/Throat:     Mouth: Mucous membranes are moist.     Pharynx: Oropharynx is clear. No oropharyngeal exudate.  Eyes:     Extraocular Movements: Extraocular movements intact.     Pupils: Pupils are equal, round, and reactive to light.  Cardiovascular:     Rate and Rhythm: Normal rate and regular rhythm.     Heart sounds: Murmur heard.  Pulmonary:     Effort: No respiratory distress.     Breath sounds: No wheezing.     Comments: Decreased breath sounds bilaterally.  Abdominal:     General: Bowel sounds are normal. There is no distension.     Palpations: Abdomen is soft. There is no mass.     Tenderness: There is no abdominal tenderness. There is no guarding or rebound.  Musculoskeletal:        General: No tenderness. Normal range of motion.     Cervical back: Normal range of motion and neck supple.  Skin:    General: Skin is warm.  Neurological:     General: No focal deficit present.     Mental Status: She is alert and oriented to person, place, and time.  Psychiatric:        Mood and Affect: Affect normal.        Behavior: Behavior normal.        Judgment: Judgment normal.      LABORATORY DATA:  I have reviewed the data as listed Lab Results  Component Value Date   WBC 8.1 09/29/2021   HGB 8.8 (L) 10/07/2021   HCT 26.0 (L) 10/07/2021   MCV 86.9 09/29/2021   PLT 338 09/29/2021   No results for input(s): "NA", "K", "CL", "CO2", "GLUCOSE", "BUN", "CREATININE", "CALCIUM", "GFRNONAA", "GFRAA", "PROT", "ALBUMIN", "AST", "ALT", "ALKPHOS", "BILITOT", "BILIDIR", "IBILI" in the last 8760 hours.   DG Ankle Complete Right  Result Date: 11/29/2022 Please see detailed radiograph report in office note.   ASSESSMENT & PLAN:   Symptomatic anemia # Chronic anemia-normocytic recently getting worse.  Patient is quite  symptomatic from anemia. The  etiology is likely ESRD- .   I had a long discussion with patient regarding multiple other etiologies of anemia-including nutritional; malabsorption; primary bone marrow disorders etc. however ESRD/iron deficiency seems the cause of patient's anemia.  # I discussed regarding ordering labs today however patient stated she recently had blood work done with dialysis yesterday/cephalexin for blood draw today.  In general I would recommend CBC CMP LDH peripheral smear; haptoglobin; erythropoietin; iron studies ferritin B12 folic acid; reticulocyte count; hepatitis panel;  multiple myeloma panel.  Kappa lambda light chain ratio.  Given the patient blood counts and to given the obvious cause of patient's anemia I would recommend holding off blood test today.  I left a message for Dr. Lourdes Sledge to discuss future blood work.  HOLD of bone marrow Biopsy at this time; pending above work up.   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  Left a message for Dr. Cherylann Ratel.  Thank you Dr. Greggory Stallion for allowing me to participate in the care of your pleasant patient. Please do not hesitate to contact me with questions or concerns in the interim.  # DISPOSITION: # No labs today # follow up TBD-- Dr.B  All questions were answered. The patient knows to call the clinic with any problems, questions or concerns.   Earna Coder, MD 12/21/2022 2:36 PM

## 2022-12-21 NOTE — Progress Notes (Signed)
Rt ankle pain 6/10.

## 2023-01-13 ENCOUNTER — Other Ambulatory Visit: Payer: Self-pay | Admitting: Family Medicine

## 2023-01-13 ENCOUNTER — Ambulatory Visit
Admission: RE | Admit: 2023-01-13 | Discharge: 2023-01-13 | Disposition: A | Discharge status: Home / Self Care | Payer: Medicare HMO | Attending: Family Medicine | Admitting: Family Medicine

## 2023-01-13 ENCOUNTER — Ambulatory Visit
Admission: RE | Admit: 2023-01-13 | Discharge: 2023-01-13 | Disposition: A | Discharge status: Home / Self Care | Payer: Medicare HMO | Source: Ambulatory Visit | Attending: Family Medicine | Admitting: Family Medicine

## 2023-01-13 DIAGNOSIS — R059 Cough, unspecified: Secondary | ICD-10-CM | POA: Insufficient documentation

## 2023-01-19 ENCOUNTER — Other Ambulatory Visit: Payer: Self-pay | Admitting: Family Medicine

## 2023-01-19 DIAGNOSIS — R921 Mammographic calcification found on diagnostic imaging of breast: Secondary | ICD-10-CM

## 2023-01-23 ENCOUNTER — Other Ambulatory Visit: Payer: Self-pay

## 2023-01-23 ENCOUNTER — Emergency Department: Payer: Medicare HMO

## 2023-01-23 ENCOUNTER — Inpatient Hospital Stay
Admission: EM | Admit: 2023-01-23 | Discharge: 2023-02-04 | DRG: 640 | Disposition: A | Discharge status: Home / Self Care | Payer: Medicare HMO | Attending: Hospitalist | Admitting: Hospitalist

## 2023-01-23 DIAGNOSIS — E669 Obesity, unspecified: Secondary | ICD-10-CM | POA: Diagnosis present

## 2023-01-23 DIAGNOSIS — E1165 Type 2 diabetes mellitus with hyperglycemia: Secondary | ICD-10-CM | POA: Diagnosis present

## 2023-01-23 DIAGNOSIS — Z833 Family history of diabetes mellitus: Secondary | ICD-10-CM

## 2023-01-23 DIAGNOSIS — E1122 Type 2 diabetes mellitus with diabetic chronic kidney disease: Secondary | ICD-10-CM

## 2023-01-23 DIAGNOSIS — J810 Acute pulmonary edema: Secondary | ICD-10-CM | POA: Diagnosis present

## 2023-01-23 DIAGNOSIS — A4150 Gram-negative sepsis, unspecified: Secondary | ICD-10-CM

## 2023-01-23 DIAGNOSIS — I120 Hypertensive chronic kidney disease with stage 5 chronic kidney disease or end stage renal disease: Secondary | ICD-10-CM | POA: Diagnosis present

## 2023-01-23 DIAGNOSIS — K219 Gastro-esophageal reflux disease without esophagitis: Secondary | ICD-10-CM | POA: Diagnosis present

## 2023-01-23 DIAGNOSIS — E8770 Fluid overload, unspecified: Secondary | ICD-10-CM | POA: Diagnosis not present

## 2023-01-23 DIAGNOSIS — Z803 Family history of malignant neoplasm of breast: Secondary | ICD-10-CM

## 2023-01-23 DIAGNOSIS — E871 Hypo-osmolality and hyponatremia: Secondary | ICD-10-CM | POA: Diagnosis present

## 2023-01-23 DIAGNOSIS — Z882 Allergy status to sulfonamides status: Secondary | ICD-10-CM

## 2023-01-23 DIAGNOSIS — R768 Other specified abnormal immunological findings in serum: Secondary | ICD-10-CM | POA: Diagnosis present

## 2023-01-23 DIAGNOSIS — N186 End stage renal disease: Secondary | ICD-10-CM | POA: Diagnosis present

## 2023-01-23 DIAGNOSIS — Z1152 Encounter for screening for COVID-19: Secondary | ICD-10-CM

## 2023-01-23 DIAGNOSIS — Z6834 Body mass index (BMI) 34.0-34.9, adult: Secondary | ICD-10-CM

## 2023-01-23 DIAGNOSIS — N2581 Secondary hyperparathyroidism of renal origin: Secondary | ICD-10-CM | POA: Diagnosis present

## 2023-01-23 DIAGNOSIS — I10 Essential (primary) hypertension: Secondary | ICD-10-CM

## 2023-01-23 DIAGNOSIS — Z79899 Other long term (current) drug therapy: Secondary | ICD-10-CM

## 2023-01-23 DIAGNOSIS — Z90710 Acquired absence of both cervix and uterus: Secondary | ICD-10-CM

## 2023-01-23 DIAGNOSIS — R0602 Shortness of breath: Principal | ICD-10-CM

## 2023-01-23 DIAGNOSIS — D529 Folate deficiency anemia, unspecified: Secondary | ICD-10-CM | POA: Diagnosis present

## 2023-01-23 DIAGNOSIS — M1710 Unilateral primary osteoarthritis, unspecified knee: Secondary | ICD-10-CM | POA: Diagnosis present

## 2023-01-23 DIAGNOSIS — E785 Hyperlipidemia, unspecified: Secondary | ICD-10-CM | POA: Insufficient documentation

## 2023-01-23 DIAGNOSIS — J90 Pleural effusion, not elsewhere classified: Secondary | ICD-10-CM | POA: Diagnosis present

## 2023-01-23 DIAGNOSIS — Z992 Dependence on renal dialysis: Secondary | ICD-10-CM

## 2023-01-23 DIAGNOSIS — E878 Other disorders of electrolyte and fluid balance, not elsewhere classified: Secondary | ICD-10-CM | POA: Diagnosis present

## 2023-01-23 DIAGNOSIS — N390 Urinary tract infection, site not specified: Secondary | ICD-10-CM

## 2023-01-23 DIAGNOSIS — E876 Hypokalemia: Secondary | ICD-10-CM | POA: Diagnosis present

## 2023-01-23 DIAGNOSIS — D649 Anemia, unspecified: Secondary | ICD-10-CM

## 2023-01-23 DIAGNOSIS — J45909 Unspecified asthma, uncomplicated: Secondary | ICD-10-CM | POA: Diagnosis present

## 2023-01-23 DIAGNOSIS — I517 Cardiomegaly: Secondary | ICD-10-CM | POA: Diagnosis present

## 2023-01-23 DIAGNOSIS — K5730 Diverticulosis of large intestine without perforation or abscess without bleeding: Secondary | ICD-10-CM | POA: Diagnosis present

## 2023-01-23 DIAGNOSIS — K5900 Constipation, unspecified: Secondary | ICD-10-CM | POA: Diagnosis present

## 2023-01-23 DIAGNOSIS — D631 Anemia in chronic kidney disease: Secondary | ICD-10-CM

## 2023-01-23 LAB — CBC WITH DIFFERENTIAL/PLATELET
Abs Immature Granulocytes: 0.27 10*3/uL — ABNORMAL HIGH (ref 0.00–0.07)
Basophils Absolute: 0 10*3/uL (ref 0.0–0.1)
Basophils Relative: 0 %
Eosinophils Absolute: 0.1 10*3/uL (ref 0.0–0.5)
Eosinophils Relative: 0 %
HCT: 21.1 % — ABNORMAL LOW (ref 36.0–46.0)
Hemoglobin: 7.1 g/dL — ABNORMAL LOW (ref 12.0–15.0)
Immature Granulocytes: 1 %
Lymphocytes Relative: 4 %
Lymphs Abs: 0.8 10*3/uL (ref 0.7–4.0)
MCH: 29.6 pg (ref 26.0–34.0)
MCHC: 33.6 g/dL (ref 30.0–36.0)
MCV: 87.9 fL (ref 80.0–100.0)
Monocytes Absolute: 1.3 10*3/uL — ABNORMAL HIGH (ref 0.1–1.0)
Monocytes Relative: 6 %
Neutro Abs: 18.1 10*3/uL — ABNORMAL HIGH (ref 1.7–7.7)
Neutrophils Relative %: 89 %
Platelets: 319 10*3/uL (ref 150–400)
RBC: 2.4 MIL/uL — ABNORMAL LOW (ref 3.87–5.11)
RDW: 14.6 % (ref 11.5–15.5)
WBC: 20.5 10*3/uL — ABNORMAL HIGH (ref 4.0–10.5)
nRBC: 0 % (ref 0.0–0.2)

## 2023-01-23 LAB — TROPONIN I (HIGH SENSITIVITY)
Troponin I (High Sensitivity): 28 ng/L — ABNORMAL HIGH (ref <18)
Troponin I (High Sensitivity): 31 ng/L — ABNORMAL HIGH (ref <18)

## 2023-01-23 LAB — BASIC METABOLIC PANEL
Anion gap: 13 (ref 5–15)
BUN: 69 mg/dL — ABNORMAL HIGH (ref 8–23)
CO2: 25 mmol/L (ref 22–32)
Calcium: 7.7 mg/dL — ABNORMAL LOW (ref 8.9–10.3)
Chloride: 94 mmol/L — ABNORMAL LOW (ref 98–111)
Creatinine, Ser: 10.63 mg/dL — ABNORMAL HIGH (ref 0.44–1.00)
GFR, Estimated: 4 mL/min — ABNORMAL LOW (ref >60)
Glucose, Bld: 61 mg/dL — ABNORMAL LOW (ref 70–99)
Potassium: 3.4 mmol/L — ABNORMAL LOW (ref 3.5–5.1)
Sodium: 132 mmol/L — ABNORMAL LOW (ref 135–145)

## 2023-01-23 LAB — BRAIN NATRIURETIC PEPTIDE: B Natriuretic Peptide: 1587.4 pg/mL — ABNORMAL HIGH (ref 0.0–100.0)

## 2023-01-23 LAB — SARS CORONAVIRUS 2 BY RT PCR: SARS Coronavirus 2 by RT PCR: NEGATIVE

## 2023-01-23 MED ORDER — ALBUTEROL SULFATE (2.5 MG/3ML) 0.083% IN NEBU
2.5000 mg | INHALATION_SOLUTION | RESPIRATORY_TRACT | Status: DC | PRN
  Administered 2023-01-25 – 2023-01-28 (×3): 2.5 mg via RESPIRATORY_TRACT
  Filled 2023-01-23 (×3): qty 3

## 2023-01-23 NOTE — ED Triage Notes (Signed)
Pt here for SOB. Hx of asthma, thinks she has bronchitis. Saw PCP Friday but symptoms has not improved. Cannot lay flat.

## 2023-01-23 NOTE — ED Notes (Signed)
Patient placed on cardiac monitor, and confirmed with CCMD.

## 2023-01-23 NOTE — ED Provider Triage Note (Signed)
Emergency Medicine Provider Triage Evaluation Note  Heidi [name] , a 67 y.o. female  was evaluated in triage.  Pt complains of SOB, saw pCP on Friday and was given meds, no improvement. States she cannot lay flat.  Review of Systems  Positive: SOB, cough Negative: fevers  Physical Exam  BP (!) 147/82   Pulse 76   Temp 98 F (36.7 C) (Oral)   Resp (!) 22   SpO2 97%  Gen:   Awake, no distress   Resp:  Normal effort  MSK:   Moves extremities without difficulty  Other:    Medical Decision Making  Medically screening exam initiated at 1:09 PM.  Appropriate orders placed.  Heidi [name] was informed that the remainder of the evaluation will be completed by another provider, this initial triage assessment does not replace that evaluation, and the importance of remaining in the ED until their evaluation is complete.     Cameron Ali, PA-C 01/23/23 1310

## 2023-01-23 NOTE — ED Provider Notes (Signed)
[facility] Provider Note    Event Date/Time   First MD Initiated Contact with Patient 01/23/23 2309     (approximate)   History   Shortness of Breath   HPI  Heidi [name] is a 67 y.o. female who presents to the ED from home with a chief complaint of shortness of breath.  Patient with a history of ESRD on PD, anemia of chronic disease followed by hematology, who endorses symptoms x 3 days not associated with chest tightness.  Denies fever/chills, cough, abdominal pain, nausea, vomiting or dizziness.  Endorses dyspnea on exertion and orthopnea.      Past Medical History   Past Medical History:  Diagnosis Date   Allergic rhinitis    Arthritis    sacroiliac joint   Asthma    Breast mass in female 09/05/2014   Degenerative arthritis of lumbar spine    Diabetes mellitus without complication (HCC)    Genu valgus, congenital    GERD (gastroesophageal reflux disease)    Hyperlipidemia    Hypertension    Hyponatremia    Microalbuminuria    Pes planus    Renal disorder    CKD stage 3   Tricompartment degenerative joint disease of knee    Trochanteric bursitis of both hips    Volvulus (HCC)      Active Problem List   Patient Active Problem List   Diagnosis Date Noted   Fluid overload 01/24/2023   Acute on chronic anemia 01/24/2023   Essential hypertension 01/24/2023   Type 2 diabetes mellitus with chronic kidney disease (HCC) 01/24/2023   Dyslipidemia 01/24/2023   Sepsis due to gram-negative UTI (HCC) 01/24/2023   Peritoneal dialysis catheter in situ (HCC) 11/16/2022   Unspecified chronic bronchitis (HCC) 11/16/2022   Symptomatic anemia 05/17/2022   Dependence on renal dialysis (HCC) 05/17/2022   Hypertensive chronic kidney disease with stage 5 chronic kidney disease or end stage renal disease (HCC) 05/17/2022   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 (HCC) 05/17/2022   Long term (current) use of insulin (HCC) 05/17/2022    Preop cardiovascular exam 03/08/2022   Red blood cell antibody positive 02/15/2022   Insomnia 12/02/2021   Vitamin D insufficiency 12/02/2021   ESRD needing dialysis (HCC)    Ventral hernia without obstruction or gangrene    Hyponatremia 09/20/2021   Open displaced fracture of body of right talus 03/10/2021   Type I or II open bimalleolar fracture of right ankle 03/10/2021   Fracture of manubrium, initial encounter for closed fracture 02/18/2021   Hyperglycemia 02/18/2021   Morbid obesity (HCC) 01/12/2019   Obesity (BMI 35.0-39.9 without comorbidity) 01/12/2019   Lab test positive for detection of COVID-19 virus 01/12/2019   Tricompartment degenerative joint disease of knee 08/18/2017   Secondary hyperparathyroidism of renal origin (HCC) 07/29/2015   Breast mass in female 09/05/2014   Arthritis of sacroiliac joint (HCC) 06/10/2014   Facet arthritis of lumbar region 06/10/2014   Degenerative arthritis of lumbar spine 06/10/2014   Elevated parathyroid hormone 03/21/2014   Stage 3 chronic kidney disease (HCC) 01/19/2014   Atopic dermatitis 07/03/2013   Proteinuria 04/02/2013   Genu valgus, 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   Type 2 diabetes mellitus with stage 3a chronic kidney disease, with long-term current use of insulin (HCC) 03/15/2012   Hyperlipidemia 03/15/2012   Benign essential hypertension 03/15/2012   Allergic rhinitis 03/15/2012   Pes planus 03/15/2012   Trochanteric bursitis of both hips 03/15/2012     Past Surgical History  Past Surgical History:  Procedure Laterality Date   ABDOMINAL HYSTERECTOMY     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   BREAST BIOPSY Right 1996   benign   BREAST BIOPSY Right 07/13/2022   stereo biopsy/ x clip/ path pending   BREAST BIOPSY Right 07/13/2022   MM RT BREAST BX W LOC DEV 1ST LESION IMAGE BX SPEC STEREO GUIDE 07/13/2022 [facility]   BREAST EXCISIONAL BIOPSY Right 2016   broken ankle surgery Right 02/2021   CAPD INSERTION N/A  10/07/2021   Procedure: LAPAROSCOPIC INSERTION CONTINUOUS AMBULATORY PERITONEAL DIALYSIS  (CAPD) CATHETER;  Surgeon: Leafy Ro, MD;  Location: [facility];  Service: General;  Laterality: N/A;   COLONOSCOPY     COLONOSCOPY N/A 12/19/2019   Procedure: COLONOSCOPY;  Surgeon: Toledo, Boykin Nearing, MD;  Location: [facility];  Service: Gastroenterology;  Laterality: N/A;   EXPLORATORY LAPAROTOMY     of colon for torsion   INSERTION OF MESH  10/07/2021   Procedure: INSERTION OF MESH;  Surgeon: Leafy Ro, MD;  Location: [facility];  Service: General;;   NASAL FRACTURE SURGERY     UMBILICAL HERNIA REPAIR N/A 10/07/2021   Procedure: HERNIA REPAIR UMBILICAL ADULT;  Surgeon: Leafy Ro, MD;  Location: [facility];  Service: General;  Laterality: N/A;     Home Medications   Prior to Admission medications   Medication Sig Start Date End Date Taking? Authorizing Provider  albuterol (VENTOLIN HFA) 108 (90 Base) MCG/ACT inhaler Inhale into the lungs every 6 (six) hours as needed for wheezing or shortness of breath.   Yes [provider]  calcitRIOL (ROCALTROL) 0.25 MCG capsule Take 0.25 mcg by mouth daily.   Yes [provider]  carvedilol (COREG) 12.5 MG tablet Take 12.5 mg by mouth in the morning, at noon, and at bedtime.   Yes [provider]  cloNIDine (CATAPRES) 0.1 MG tablet Take 0.1 mg by mouth 2 (two) times daily.   Yes [provider]  fluticasone (FLONASE) 50 MCG/ACT nasal spray Place into both nostrils daily.   Yes [provider]  hydrALAZINE (APRESOLINE) 25 MG tablet Take 25 mg by mouth 2 times daily at 12 noon and 4 pm.   Yes [provider]  losartan (COZAAR) 100 MG tablet Take by mouth. 04/12/16  Yes [provider]  lovastatin (MEVACOR) 20 MG tablet Take by mouth. 01/16/14 01/24/23 Yes [provider]  metoprolol tartrate (LOPRESSOR) 100 MG tablet Take 100 mg by mouth 2 (two) times daily. 02/02/17 01/24/23 Yes  [provider]  NOVOLIN 70/30 (70-30) 100 UNIT/ML injection Inject into the skin.   Yes [provider]  Vitamin D, Ergocalciferol, (DRISDOL) 1.25 MG (50000 UNIT) CAPS capsule Take 50,000 Units by mouth every 7 (seven) days.   Yes [provider]  amLODipine (NORVASC) 10 MG tablet Take 10 mg by mouth daily. 01/16/14 10/07/21  [provider]  Lancets [facility] DELICA PLUS [facility]) MISC  03/04/22   [provider]  NOVOLIN N 100 UNIT/ML injection Taking 20 units in am and 15 units at bedtime Patient not taking: Reported on 01/24/2023 04/20/17   [provider]  PRECISION QID TEST test strip 1 each (1 strip total) 3 (three) times daily Use as instructed. 03/04/22 06/17/23  [provider]     Allergies  Sulfa antibiotics and Misc. sulfonamide containing compounds   Family History   Family History  Problem Relation Age of Onset   Diabetes Mother    Diabetes  Father    Cancer Father    Breast cancer Maternal Aunt      Physical Exam  Triage Vital Signs: ED Triage Vitals  Encounter Vitals Group     BP 01/23/23 1307 (!) 147/82     Systolic BP Percentile --      Diastolic BP Percentile --      Pulse Rate 01/23/23 1307 76     Resp 01/23/23 1307 (!) 22     Temp 01/23/23 1307 98 F (36.7 C)     Temp Source 01/23/23 1307 Oral     SpO2 01/23/23 1307 97 %     Weight --      Height --      Head Circumference --      Peak Flow --      Pain Score 01/23/23 1304 0     Pain Loc --      Pain Education --      Exclude from Growth Chart --     Updated Vital Signs: BP 139/65   Pulse 73   Temp 98.5 F (36.9 C) (Oral)   Resp 16   SpO2 100%    General: Awake, mild distress.  CV:  RRR.  Good peripheral perfusion.  Resp:  Normal effort.  Faint bibasilar rales. Abd:  Nontender.  No distention.  Other:  1+ BLE pedal edema.   ED Results / Procedures / Treatments  Labs (all labs ordered are listed, but only abnormal results  are displayed) Labs Reviewed  BASIC METABOLIC PANEL - Abnormal; Notable for the following components:      Result Value   Sodium 132 (*)    Potassium 3.4 (*)    Chloride 94 (*)    Glucose, Bld 61 (*)    BUN 69 (*)    Creatinine, Ser 10.63 (*)    Calcium 7.7 (*)    GFR, Estimated 4 (*)    All other components within normal limits  BRAIN NATRIURETIC PEPTIDE - Abnormal; Notable for the following components:   B Natriuretic Peptide 1,587.4 (*)    All other components within normal limits  CBC WITH DIFFERENTIAL/PLATELET - Abnormal; Notable for the following components:   WBC 20.5 (*)    RBC 2.40 (*)    Hemoglobin 7.1 (*)    HCT 21.1 (*)    Neutro Abs 18.1 (*)    Monocytes Absolute 1.3 (*)    Abs Immature Granulocytes 0.27 (*)    All other components within normal limits  URINALYSIS, ROUTINE W REFLEX MICROSCOPIC - Abnormal; Notable for the following components:   Color, Urine YELLOW (*)    APPearance CLOUDY (*)    Hgb urine dipstick SMALL (*)    Protein, ur >=300 (*)    Leukocytes,Ua TRACE (*)    Bacteria, UA RARE (*)    All other components within normal limits  CBC WITH DIFFERENTIAL/PLATELET - Abnormal; Notable for the following components:   WBC 16.4 (*)    RBC 2.15 (*)    Hemoglobin 6.4 (*)    HCT 19.8 (*)    Neutro Abs 13.7 (*)    Monocytes Absolute 1.5 (*)    Abs Immature Granulocytes 0.19 (*)    All other components within normal limits  TROPONIN I (HIGH SENSITIVITY) - Abnormal; Notable for the following components:   Troponin I (High Sensitivity) 28 (*)    All other components within normal limits  TROPONIN I (HIGH SENSITIVITY) - Abnormal; Notable for the following components:   Troponin I (  High Sensitivity) 31 (*)    All other components within normal limits  TROPONIN I (HIGH SENSITIVITY) - Abnormal; Notable for the following components:   Troponin I (High Sensitivity) 30 (*)    All other components within normal limits  SARS CORONAVIRUS 2 BY RT PCR  CULTURE,  BLOOD (ROUTINE X 2)  CULTURE, BLOOD (ROUTINE X 2)  URINE CULTURE  LACTIC ACID, PLASMA  PROTIME-INR  APTT  HIV ANTIBODY (ROUTINE TESTING W REFLEX)  CBC  COMPREHENSIVE METABOLIC PANEL  LACTIC ACID, PLASMA  PREPARE RBC (CROSSMATCH)  TYPE AND SCREEN  ABO/RH     EKG  ED ECG REPORT I, Attilio Zeitler J, the attending physician, personally viewed and interpreted this ECG.   Date: 01/23/2023  EKG Time: 1454  Rate: 79  Rhythm: normal sinus rhythm  Axis: Normal  Intervals:none  ST&T Change: ?  Elevation leads II and III  ED ECG REPORT I, Tierany Appleby J, the attending physician, personally viewed and interpreted this ECG.   Date: 01/24/2023  EKG Time: 0004  Rate: 88  Rhythm: normal sinus rhythm  Axis: Normal  Intervals:none  ST&T Change: Nonspecific     RADIOLOGY I have independently visualized and interpreted patient's imaging study as well as noted the radiology interpretation:  Chest x-ray: Cardiomegaly with pulmonary edema and bilateral pleural effusions left greater than right  Official radiology report(s): DG Chest 2 View Result Date: 01/23/2023 CLINICAL DATA:  Shortness of breath.  Intermittent chest pain. EXAM: CHEST - 2 VIEW COMPARISON:  Chest radiograph dated January 13, 2023. FINDINGS: The heart is enlarged. The mediastinal contours are within normal limits. Aortic atherosclerosis. Small left-greater-than-right pleural effusions basilar atelectasis. Mild bilateral bronchial thickening. No pneumothorax. No acute osseous abnormality. IMPRESSION: Cardiomegaly with pulmonary edema and new small left-greater-than-right pleural effusions. Electronically Signed   By: Hart Robinsons M.D.   On: 01/23/2023 15:22     PROCEDURES:  Critical Care performed: No  .1-3 Lead EKG Interpretation  Performed by: Irean Hong, MD Authorized by: Irean Hong, MD     Interpretation: normal     ECG rate:  80   ECG rate assessment: normal     Rhythm: sinus rhythm     Ectopy: none      Conduction: normal   Comments:     Patient placed on cardiac monitor to evaluate for arrhythmias    MEDICATIONS ORDERED IN ED: Medications  albuterol (PROVENTIL) (2.5 MG/3ML) 0.083% nebulizer solution 2.5 mg (has no administration in time range)  sodium chloride flush (NS) 0.9 % injection 10 mL (10 mLs Intravenous Given 01/24/23 0444)  amLODipine (NORVASC) tablet 10 mg (has no administration in time range)  carvedilol (COREG) tablet 12.5 mg (has no administration in time range)  cloNIDine (CATAPRES) tablet 0.1 mg (0.1 mg Oral Given 01/24/23 0319)  hydrALAZINE (APRESOLINE) tablet 25 mg (has no administration in time range)  losartan (COZAAR) tablet 100 mg (has no administration in time range)  metoprolol tartrate (LOPRESSOR) tablet 100 mg (100 mg Oral Given 01/24/23 0318)  pravastatin (PRAVACHOL) tablet 20 mg (has no administration in time range)  calcitRIOL (ROCALTROL) capsule 0.25 mcg (has no administration in time range)  insulin aspart protamine- aspart (NOVOLOG MIX 70/30) injection 10 Units (has no administration in time range)  Vitamin D (Ergocalciferol) (DRISDOL) 1.25 MG (50000 UNIT) capsule 50,000 Units (has no administration in time range)  fluticasone (FLONASE) 50 MCG/ACT nasal spray 2 spray (has no administration in time range)  heparin injection 5,000 Units (5,000 Units Subcutaneous Not Given 01/24/23  2536)  acetaminophen (TYLENOL) tablet 650 mg (has no administration in time range)    Or  acetaminophen (TYLENOL) suppository 650 mg (has no administration in time range)  traZODone (DESYREL) tablet 25 mg (has no administration in time range)  magnesium hydroxide (MILK OF MAGNESIA) suspension 30 mL (has no administration in time range)  ondansetron (ZOFRAN) tablet 4 mg (has no administration in time range)    Or  ondansetron (ZOFRAN) injection 4 mg (has no administration in time range)  cefTRIAXone (ROCEPHIN) 2 g in sodium chloride 0.9 % 100 mL IVPB (2 g Intravenous New  Bag/Given 01/24/23 0513)  furosemide (LASIX) injection 40 mg (40 mg Intravenous Given 01/24/23 0319)     IMPRESSION / MDM / ASSESSMENT AND PLAN / ED COURSE  I reviewed the triage vital signs and the nursing notes.                             67 year old female on peritoneal dialysis here for shortness of breath. Differential includes, but is not limited to, viral syndrome, bronchitis including COPD exacerbation, pneumonia, reactive airway disease including asthma, CHF including exacerbation with or without pulmonary/interstitial edema, pneumothorax, ACS, thoracic trauma, and pulmonary embolism.  I personally reviewed patient's records and note her PCP office visit from 01/20/2023 for cough.  Patient's presentation is most consistent with acute presentation with potential threat to life or bodily function.  The patient is on the cardiac monitor to evaluate for evidence of arrhythmia and/or significant heart rate changes.  Laboratory results demonstrate leukocytosis with WBC 20.5, worsening anemia with hemoglobin 7.1 compared to 8.4 on 10/13/2022.  BNP elevated.  Troponin mildly elevated which is likely secondary to kidney disease.  Will consult nephrology and hospitalist services for valuation and admission.    FINAL CLINICAL IMPRESSION(S) / ED DIAGNOSES   Final diagnoses:  Shortness of breath  Symptomatic anemia     Rx / DC Orders   ED Discharge Orders     None        Note:  This document was prepared using Dragon voice recognition software and may include unintentional dictation errors.   Irean Hong, MD 01/24/23 [phone]

## 2023-01-24 ENCOUNTER — Other Ambulatory Visit: Payer: Self-pay

## 2023-01-24 DIAGNOSIS — J45909 Unspecified asthma, uncomplicated: Secondary | ICD-10-CM | POA: Diagnosis present

## 2023-01-24 DIAGNOSIS — I10 Essential (primary) hypertension: Secondary | ICD-10-CM

## 2023-01-24 DIAGNOSIS — E8779 Other fluid overload: Secondary | ICD-10-CM | POA: Diagnosis not present

## 2023-01-24 DIAGNOSIS — A4150 Gram-negative sepsis, unspecified: Secondary | ICD-10-CM

## 2023-01-24 DIAGNOSIS — E1122 Type 2 diabetes mellitus with diabetic chronic kidney disease: Secondary | ICD-10-CM

## 2023-01-24 DIAGNOSIS — K5730 Diverticulosis of large intestine without perforation or abscess without bleeding: Secondary | ICD-10-CM | POA: Diagnosis present

## 2023-01-24 DIAGNOSIS — Z1152 Encounter for screening for COVID-19: Secondary | ICD-10-CM | POA: Diagnosis not present

## 2023-01-24 DIAGNOSIS — J810 Acute pulmonary edema: Secondary | ICD-10-CM | POA: Diagnosis present

## 2023-01-24 DIAGNOSIS — D649 Anemia, unspecified: Secondary | ICD-10-CM | POA: Diagnosis not present

## 2023-01-24 DIAGNOSIS — N185 Chronic kidney disease, stage 5: Secondary | ICD-10-CM | POA: Diagnosis not present

## 2023-01-24 DIAGNOSIS — E8770 Fluid overload, unspecified: Secondary | ICD-10-CM | POA: Diagnosis present

## 2023-01-24 DIAGNOSIS — E785 Hyperlipidemia, unspecified: Secondary | ICD-10-CM | POA: Insufficient documentation

## 2023-01-24 DIAGNOSIS — K219 Gastro-esophageal reflux disease without esophagitis: Secondary | ICD-10-CM | POA: Diagnosis present

## 2023-01-24 DIAGNOSIS — D631 Anemia in chronic kidney disease: Secondary | ICD-10-CM | POA: Diagnosis present

## 2023-01-24 DIAGNOSIS — Z992 Dependence on renal dialysis: Secondary | ICD-10-CM | POA: Diagnosis not present

## 2023-01-24 DIAGNOSIS — J90 Pleural effusion, not elsewhere classified: Secondary | ICD-10-CM | POA: Diagnosis present

## 2023-01-24 DIAGNOSIS — I517 Cardiomegaly: Secondary | ICD-10-CM | POA: Diagnosis present

## 2023-01-24 DIAGNOSIS — Z79899 Other long term (current) drug therapy: Secondary | ICD-10-CM | POA: Diagnosis not present

## 2023-01-24 DIAGNOSIS — E871 Hypo-osmolality and hyponatremia: Secondary | ICD-10-CM | POA: Diagnosis present

## 2023-01-24 DIAGNOSIS — N186 End stage renal disease: Secondary | ICD-10-CM

## 2023-01-24 DIAGNOSIS — N390 Urinary tract infection, site not specified: Secondary | ICD-10-CM

## 2023-01-24 DIAGNOSIS — E878 Other disorders of electrolyte and fluid balance, not elsewhere classified: Secondary | ICD-10-CM | POA: Diagnosis present

## 2023-01-24 DIAGNOSIS — R768 Other specified abnormal immunological findings in serum: Secondary | ICD-10-CM | POA: Diagnosis present

## 2023-01-24 DIAGNOSIS — E669 Obesity, unspecified: Secondary | ICD-10-CM | POA: Diagnosis present

## 2023-01-24 DIAGNOSIS — N2581 Secondary hyperparathyroidism of renal origin: Secondary | ICD-10-CM | POA: Diagnosis present

## 2023-01-24 DIAGNOSIS — K5900 Constipation, unspecified: Secondary | ICD-10-CM | POA: Diagnosis present

## 2023-01-24 DIAGNOSIS — D529 Folate deficiency anemia, unspecified: Secondary | ICD-10-CM | POA: Diagnosis present

## 2023-01-24 DIAGNOSIS — E876 Hypokalemia: Secondary | ICD-10-CM | POA: Diagnosis present

## 2023-01-24 DIAGNOSIS — R0602 Shortness of breath: Secondary | ICD-10-CM | POA: Diagnosis present

## 2023-01-24 DIAGNOSIS — E1165 Type 2 diabetes mellitus with hyperglycemia: Secondary | ICD-10-CM | POA: Diagnosis present

## 2023-01-24 DIAGNOSIS — I120 Hypertensive chronic kidney disease with stage 5 chronic kidney disease or end stage renal disease: Secondary | ICD-10-CM | POA: Diagnosis present

## 2023-01-24 LAB — COMPREHENSIVE METABOLIC PANEL
ALT: 25 U/L (ref 0–44)
AST: 16 U/L (ref 15–41)
Albumin: 1.9 g/dL — ABNORMAL LOW (ref 3.5–5.0)
Alkaline Phosphatase: 61 U/L (ref 38–126)
Anion gap: 16 — ABNORMAL HIGH (ref 5–15)
BUN: 71 mg/dL — ABNORMAL HIGH (ref 8–23)
CO2: 23 mmol/L (ref 22–32)
Calcium: 7.3 mg/dL — ABNORMAL LOW (ref 8.9–10.3)
Chloride: 94 mmol/L — ABNORMAL LOW (ref 98–111)
Creatinine, Ser: 10.91 mg/dL — ABNORMAL HIGH (ref 0.44–1.00)
GFR, Estimated: 4 mL/min — ABNORMAL LOW (ref >60)
Glucose, Bld: 114 mg/dL — ABNORMAL HIGH (ref 70–99)
Potassium: 3.6 mmol/L (ref 3.5–5.1)
Sodium: 133 mmol/L — ABNORMAL LOW (ref 135–145)
Total Bilirubin: 0.3 mg/dL (ref <1.2)
Total Protein: 5.3 g/dL — ABNORMAL LOW (ref 6.5–8.1)

## 2023-01-24 LAB — CBC WITH DIFFERENTIAL/PLATELET
Abs Immature Granulocytes: 0.19 10*3/uL — ABNORMAL HIGH (ref 0.00–0.07)
Basophils Absolute: 0 10*3/uL (ref 0.0–0.1)
Basophils Relative: 0 %
Eosinophils Absolute: 0.1 10*3/uL (ref 0.0–0.5)
Eosinophils Relative: 1 %
HCT: 19.8 % — ABNORMAL LOW (ref 36.0–46.0)
Hemoglobin: 6.4 g/dL — ABNORMAL LOW (ref 12.0–15.0)
Immature Granulocytes: 1 %
Lymphocytes Relative: 5 %
Lymphs Abs: 0.8 10*3/uL (ref 0.7–4.0)
MCH: 29.8 pg (ref 26.0–34.0)
MCHC: 32.3 g/dL (ref 30.0–36.0)
MCV: 92.1 fL (ref 80.0–100.0)
Monocytes Absolute: 1.5 10*3/uL — ABNORMAL HIGH (ref 0.1–1.0)
Monocytes Relative: 9 %
Neutro Abs: 13.7 10*3/uL — ABNORMAL HIGH (ref 1.7–7.7)
Neutrophils Relative %: 84 %
Platelets: 301 10*3/uL (ref 150–400)
RBC: 2.15 MIL/uL — ABNORMAL LOW (ref 3.87–5.11)
RDW: 14.7 % (ref 11.5–15.5)
WBC: 16.4 10*3/uL — ABNORMAL HIGH (ref 4.0–10.5)
nRBC: 0 % (ref 0.0–0.2)

## 2023-01-24 LAB — IRON AND TIBC
Iron: 35 ug/dL (ref 28–170)
Saturation Ratios: 28 % (ref 10.4–31.8)
TIBC: 125 ug/dL — ABNORMAL LOW (ref 250–450)
UIBC: 90 ug/dL

## 2023-01-24 LAB — FERRITIN: Ferritin: 1379 ng/mL — ABNORMAL HIGH (ref 11–307)

## 2023-01-24 LAB — HIV ANTIBODY (ROUTINE TESTING W REFLEX): HIV Screen 4th Generation wRfx: NONREACTIVE

## 2023-01-24 LAB — APTT: aPTT: 32 s (ref 24–36)

## 2023-01-24 LAB — URINALYSIS, ROUTINE W REFLEX MICROSCOPIC
Bilirubin Urine: NEGATIVE
Glucose, UA: NEGATIVE mg/dL
Ketones, ur: NEGATIVE mg/dL
Nitrite: NEGATIVE
Protein, ur: >=300 mg/dL — AB
Specific Gravity, Urine: 1.012 (ref 1.005–1.030)
pH: 5 (ref 5.0–8.0)

## 2023-01-24 LAB — ABO/RH: ABO/RH(D): O POS

## 2023-01-24 LAB — PROTIME-INR
INR: 1.1 (ref 0.8–1.2)
Prothrombin Time: 14.4 s (ref 11.4–15.2)

## 2023-01-24 LAB — LACTIC ACID, PLASMA: Lactic Acid, Venous: 0.6 mmol/L (ref 0.5–1.9)

## 2023-01-24 LAB — FOLATE: Folate: 5 ng/mL — ABNORMAL LOW (ref >5.9)

## 2023-01-24 LAB — PROCALCITONIN: Procalcitonin: 1.83 ng/mL

## 2023-01-24 LAB — CBG MONITORING, ED
Glucose-Capillary: 99 mg/dL (ref 70–99)
Glucose-Capillary: 149 mg/dL — ABNORMAL HIGH (ref 70–99)
Glucose-Capillary: 175 mg/dL — ABNORMAL HIGH (ref 70–99)

## 2023-01-24 LAB — TROPONIN I (HIGH SENSITIVITY): Troponin I (High Sensitivity): 30 ng/L — ABNORMAL HIGH (ref <18)

## 2023-01-24 MED ORDER — HYDRALAZINE HCL 25 MG PO TABS
25.0000 mg | ORAL_TABLET | Freq: Two times a day (BID) | ORAL | Status: DC
  Administered 2023-01-24 – 2023-01-25 (×3): 25 mg via ORAL
  Filled 2023-01-24 (×3): qty 1

## 2023-01-24 MED ORDER — ONDANSETRON HCL 4 MG/2ML IJ SOLN
4.0000 mg | Freq: Four times a day (QID) | INTRAMUSCULAR | Status: DC | PRN
Start: 2023-01-24 — End: 2023-01-26
  Administered 2023-01-24 – 2023-01-25 (×2): 4 mg via INTRAVENOUS
  Filled 2023-01-24 (×2): qty 2

## 2023-01-24 MED ORDER — ACETAMINOPHEN 325 MG PO TABS
650.0000 mg | ORAL_TABLET | Freq: Four times a day (QID) | ORAL | Status: DC | PRN
  Administered 2023-01-30 – 2023-01-31 (×2): 650 mg via ORAL
  Filled 2023-01-24 (×2): qty 2

## 2023-01-24 MED ORDER — CLONIDINE HCL 0.1 MG PO TABS
0.1000 mg | ORAL_TABLET | Freq: Two times a day (BID) | ORAL | Status: DC
  Administered 2023-01-24 – 2023-02-04 (×22): 0.1 mg via ORAL
  Filled 2023-01-24 (×22): qty 1

## 2023-01-24 MED ORDER — VITAMIN D (ERGOCALCIFEROL) 1.25 MG (50000 UNIT) PO CAPS
50000.0000 [IU] | ORAL_CAPSULE | ORAL | Status: DC
  Administered 2023-01-28 – 2023-02-04 (×2): 50000 [IU] via ORAL
  Filled 2023-01-24 (×2): qty 1

## 2023-01-24 MED ORDER — ACETAMINOPHEN 325 MG RE SUPP: 650.0000 mg | Freq: Four times a day (QID) | RECTAL | Status: DC | PRN

## 2023-01-24 MED ORDER — GENTAMICIN SULFATE 0.1 % EX CREA
1.0000 | TOPICAL_CREAM | Freq: Every day | CUTANEOUS | Status: DC
  Administered 2023-01-24 – 2023-02-02 (×9): 1 via TOPICAL
  Filled 2023-01-24: qty 15

## 2023-01-24 MED ORDER — PRAVASTATIN SODIUM 20 MG PO TABS
20.0000 mg | ORAL_TABLET | Freq: Every day | ORAL | Status: DC
  Administered 2023-01-24 – 2023-02-03 (×10): 20 mg via ORAL
  Filled 2023-01-24 (×10): qty 1

## 2023-01-24 MED ORDER — METOPROLOL TARTRATE 25 MG PO TABS
100.0000 mg | ORAL_TABLET | Freq: Two times a day (BID) | ORAL | Status: DC
Start: 2023-01-24 — End: 2023-01-24
  Administered 2023-01-24 (×2): 100 mg via ORAL
  Filled 2023-01-24 (×2): qty 4

## 2023-01-24 MED ORDER — SODIUM CHLORIDE 0.9 % IV SOLN
2.0000 g | INTRAVENOUS | Status: DC
  Administered 2023-01-24: 2 g via INTRAVENOUS
  Filled 2023-01-24: qty 20

## 2023-01-24 MED ORDER — ONDANSETRON HCL 4 MG PO TABS: 4.0000 mg | ORAL_TABLET | Freq: Four times a day (QID) | ORAL | Status: DC | PRN

## 2023-01-24 MED ORDER — FLUTICASONE PROPIONATE 50 MCG/ACT NA SUSP
2.0000 | Freq: Every day | NASAL | Status: DC
  Administered 2023-01-24 – 2023-02-03 (×9): 2 via NASAL
  Filled 2023-01-24: qty 16

## 2023-01-24 MED ORDER — CARVEDILOL 12.5 MG PO TABS
12.5000 mg | ORAL_TABLET | Freq: Three times a day (TID) | ORAL | Status: DC
  Administered 2023-01-24 – 2023-01-26 (×7): 12.5 mg via ORAL
  Filled 2023-01-24 (×2): qty 2
  Filled 2023-01-24: qty 1
  Filled 2023-01-24: qty 2
  Filled 2023-01-24 (×2): qty 1
  Filled 2023-01-24: qty 2

## 2023-01-24 MED ORDER — FUROSEMIDE 10 MG/ML IJ SOLN
80.0000 mg | Freq: Three times a day (TID) | INTRAMUSCULAR | Status: DC
  Administered 2023-01-24 – 2023-02-01 (×23): 80 mg via INTRAVENOUS
  Filled 2023-01-24 (×23): qty 8

## 2023-01-24 MED ORDER — INSULIN ASPART PROT & ASPART (70-30 MIX) 100 UNIT/ML ~~LOC~~ SUSP: 10.0000 [IU] | Freq: Every day | SUBCUTANEOUS | Status: DC

## 2023-01-24 MED ORDER — AMLODIPINE BESYLATE 10 MG PO TABS
10.0000 mg | ORAL_TABLET | Freq: Every day | ORAL | Status: DC
  Administered 2023-01-24 – 2023-01-25 (×2): 10 mg via ORAL
  Filled 2023-01-24 (×2): qty 2

## 2023-01-24 MED ORDER — CALCITRIOL 0.25 MCG PO CAPS
0.2500 ug | ORAL_CAPSULE | Freq: Every day | ORAL | Status: DC
  Administered 2023-01-24 – 2023-02-04 (×12): 0.25 ug via ORAL
  Filled 2023-01-24 (×12): qty 1

## 2023-01-24 MED ORDER — DELFLEX-LC/2.5% DEXTROSE 394 MOSM/L IP SOLN
INTRAPERITONEAL | Status: DC
  Administered 2023-01-27: 2000 mL via INTRAPERITONEAL
  Administered 2023-01-29 (×2): 6000 mL via INTRAPERITONEAL
  Filled 2023-01-24 (×11): qty 3000

## 2023-01-24 MED ORDER — SODIUM CHLORIDE 0.9% FLUSH
10.0000 mL | Freq: Two times a day (BID) | INTRAVENOUS | Status: DC
Start: 2023-01-24 — End: 2023-02-04
  Administered 2023-01-24 – 2023-02-03 (×23): 10 mL via INTRAVENOUS

## 2023-01-24 MED ORDER — TRAZODONE HCL 50 MG PO TABS
25.0000 mg | ORAL_TABLET | Freq: Every evening | ORAL | Status: DC | PRN
  Administered 2023-01-25 – 2023-01-31 (×5): 25 mg via ORAL
  Filled 2023-01-24 (×7): qty 1

## 2023-01-24 MED ORDER — FUROSEMIDE 10 MG/ML IJ SOLN
40.0000 mg | Freq: Once | INTRAMUSCULAR | Status: AC
Start: 2023-01-24 — End: 2023-01-24
  Administered 2023-01-24: 40 mg via INTRAVENOUS
  Filled 2023-01-24: qty 4

## 2023-01-24 MED ORDER — LOSARTAN POTASSIUM 50 MG PO TABS
100.0000 mg | ORAL_TABLET | Freq: Every day | ORAL | Status: DC
  Administered 2023-01-24 – 2023-02-04 (×10): 100 mg via ORAL
  Filled 2023-01-24 (×10): qty 2

## 2023-01-24 MED ORDER — DELFLEX-LC/4.25% DEXTROSE 483 MOSM/L IP SOLN
INTRAPERITONEAL | Status: DC
  Filled 2023-01-24 (×11): qty 3000

## 2023-01-24 MED ORDER — MAGNESIUM HYDROXIDE 400 MG/5ML PO SUSP
30.0000 mL | Freq: Every day | ORAL | Status: DC | PRN
Start: 2023-01-24 — End: 2023-01-26

## 2023-01-24 MED ORDER — HEPARIN SODIUM (PORCINE) 5000 UNIT/ML IJ SOLN
5000.0000 [IU] | Freq: Three times a day (TID) | INTRAMUSCULAR | Status: DC
  Administered 2023-01-24 – 2023-01-26 (×7): 5000 [IU] via SUBCUTANEOUS
  Filled 2023-01-24 (×7): qty 1

## 2023-01-24 NOTE — Progress Notes (Addendum)
[facility]  ROUNDING NOTE   Subjective:   Heidi [name] is a 67 y.o. female with past medical conditions including GERD, hypertension, dyslipidemia, asthma, osteoarthritis, anemia, and end-stage renal disease on peritoneal dialysis.  Patient presents to the emergency room complaining of shortness of breath and increased edema.  Patient has been admitted for Fluid overload [E87.70]  Patient is known to our practice and is followed by Dr Cherylann Ratel at [facility]. She states she noticed increased swelling for the past couple weeks. She continues to do nightly PD treatments. She also reports decreased urine output.   Labs on ED arrival concerning for sodium 132, potassium 3.4, BUN 69, creatinine 10.63 with GFR 4, white count 20.5 and anemia 7.1. UA appears cloudy with protien. Chest xray shows pulmonary edema with pleural effusion.  Patient received one-time dose IV furosemide 40 mg in ED.  We have been consulted to continue PD during this admission.   Objective:  Vital signs in last 24 hours:  Temp:  [98.1 F (36.7 C)-98.6 F (37 C)] 98.1 F (36.7 C) (12/17 1222) Pulse Rate:  [64-97] 72 (12/17 1222) Resp:  [12-19] 18 (12/17 1222) BP: (121-158)/(63-96) 121/85 (12/17 1222) SpO2:  [96 %-100 %] 96 % (12/17 1222)  Weight change:  There were no vitals filed for this visit.  Intake/Output: No intake/output data recorded.   Intake/Output this shift:  Total I/O In: 100 [IV Piggyback:100] Out: -   Physical Exam: General: NAD, ill appearing   Head: Normocephalic, atraumatic. Moist oral mucosal membranes  Eyes: Anicteric  Lungs:  Coarse, [place] O2  Heart: Regular rate and rhythm  Abdomen:  Soft, nontender, PDC  Extremities:  1-2+ peripheral edema.  Neurologic: Alert, moving all four extremities  Skin: No lesions  Access: PD catheter    Basic Metabolic Panel: Recent Labs  Lab 01/23/23 1311 01/24/23 0445  NA 132* 133*  K 3.4* 3.6  CL 94* 94*  CO2 25 23  GLUCOSE  61* 114*  BUN 69* 71*  CREATININE 10.63* 10.91*  CALCIUM 7.7* 7.3*    Liver Function Tests: Recent Labs  Lab 01/24/23 0445  AST 16  ALT 25  ALKPHOS 61  BILITOT 0.3  PROT 5.3*  ALBUMIN 1.9*   No results for input(s): "LIPASE", "AMYLASE" in the last 168 hours. No results for input(s): "AMMONIA" in the last 168 hours.  CBC: Recent Labs  Lab 01/23/23 1311 01/24/23 0445  WBC 20.5* 16.4*  NEUTROABS 18.1* 13.7*  HGB 7.1* 6.4*  HCT 21.1* 19.8*  MCV 87.9 92.1  PLT 319 301    Cardiac Enzymes: No results for input(s): "CKTOTAL", "CKMB", "CKMBINDEX", "TROPONINI" in the last 168 hours.  BNP: Invalid input(s): "POCBNP"  CBG: Recent Labs  Lab 01/24/23 0709 01/24/23 1229  GLUCAP 99 149*    Microbiology: Results for orders placed or performed during the hospital encounter of 01/23/23  SARS Coronavirus 2 by RT PCR (hospital order, performed in [facility] hospital lab) *cepheid single result test* Anterior Nasal Swab     Status: None   Collection Time: 01/23/23  1:08 PM   Specimen: Anterior Nasal Swab  Result Value Ref Range Status   SARS Coronavirus 2 by RT PCR NEGATIVE NEGATIVE Final    Comment: (NOTE) SARS-CoV-2 target nucleic acids are NOT DETECTED.  The SARS-CoV-2 RNA is generally detectable in upper and lower respiratory specimens during the acute phase of infection. The lowest concentration of SARS-CoV-2 viral copies this assay can detect is 250 copies / mL. A negative  result does not preclude SARS-CoV-2 infection and should not be used as the sole basis for treatment or other patient management decisions.  A negative result may occur with improper specimen collection / handling, submission of specimen other than nasopharyngeal swab, presence of viral mutation(s) within the areas targeted by this assay, and inadequate number of viral copies (<250 copies / mL). A negative result must be combined with clinical observations, patient history, and epidemiological  information.  Fact Sheet for Patients:   RoadLapTop.co.za  Fact Sheet for Healthcare Providers: http://kim-miller.com/  This test is not yet approved or  cleared by the [place] FDA and has been authorized for detection and/or diagnosis of SARS-CoV-2 by FDA under an Emergency Use Authorization (EUA).  This EUA will remain in effect (meaning this test can be used) for the duration of the COVID-19 declaration under Section 564(b)(1) of the Act, 21 U.S.C. section 360bbb-3(b)(1), unless the authorization is terminated or revoked sooner.  Performed at [facility], [address] [place], [place] [address]   Culture, blood (x 2)     Status: None (Preliminary result)   Collection Time: 01/24/23  5:10 AM   Specimen: BLOOD  Result Value Ref Range Status   Specimen Description BLOOD LEFT ASSIST CONTROL  Final   Special Requests   Final    BOTTLES DRAWN AEROBIC AND ANAEROBIC Blood Culture results may not be optimal due to an inadequate volume of blood received in culture bottles   Culture   Final    NO GROWTH <12 HOURS Performed at [facility], [address], [place], [place] [address]    Report Status PENDING  Incomplete  Culture, blood (x 2)     Status: None (Preliminary result)   Collection Time: 01/24/23  5:10 AM   Specimen: BLOOD  Result Value Ref Range Status   Specimen Description BLOOD LEFT HAND  Final   Special Requests   Final    BOTTLES DRAWN AEROBIC AND ANAEROBIC Blood Culture results may not be optimal due to an inadequate volume of blood received in culture bottles   Culture   Final    NO GROWTH <12 HOURS Performed at [facility], [address], [place], [place] [address]    Report Status PENDING  Incomplete    Coagulation Studies: Recent Labs    01/24/23 0510  LABPROT 14.4  INR 1.1    Urinalysis: Recent Labs    01/24/23 0202  COLORURINE YELLOW*  LABSPEC 1.012  PHURINE  5.0  GLUCOSEU NEGATIVE  HGBUR SMALL*  BILIRUBINUR NEGATIVE  KETONESUR NEGATIVE  PROTEINUR >=300*  NITRITE NEGATIVE  LEUKOCYTESUR TRACE*      Imaging: DG Chest 2 View Result Date: 01/23/2023 CLINICAL DATA:  Shortness of breath.  Intermittent chest pain. EXAM: CHEST - 2 VIEW COMPARISON:  Chest radiograph dated January 13, 2023. FINDINGS: The heart is enlarged. The mediastinal contours are within normal limits. Aortic atherosclerosis. Small left-greater-than-right pleural effusions basilar atelectasis. Mild bilateral bronchial thickening. No pneumothorax. No acute osseous abnormality. IMPRESSION: Cardiomegaly with pulmonary edema and new small left-greater-than-right pleural effusions. Electronically Signed   By: Hart Robinsons M.D.   On: 01/23/2023 15:22     Medications:    dialysis solution 2.5% low-MG/low-CA     dialysis solution 4.25% low-MG/low-CA      amLODipine  10 mg Oral Daily   calcitRIOL  0.25 mcg Oral Daily   carvedilol  12.5 mg Oral TID   cloNIDine  0.1 mg Oral BID   fluticasone  2 spray Each Nare Daily   furosemide  80 mg Intravenous TID   gentamicin cream  1 Application Topical Daily   heparin  5,000 Units Subcutaneous Q8H   hydrALAZINE  25 mg Oral q12n4p   losartan  100 mg Oral Daily   pravastatin  20 mg Oral q1800   sodium chloride flush  10 mL Intravenous Q12H   [START ON 01/28/2023] Vitamin D (Ergocalciferol)  50,000 Units Oral Q7 days   acetaminophen **OR** acetaminophen, albuterol, magnesium hydroxide, ondansetron **OR** ondansetron (ZOFRAN) IV, traZODone  Assessment/ Plan:  Ms. Heidi [name] is a 67 y.o.  female  with past medical conditions including GERD, hypertension, dyslipidemia, asthma, osteoarthritis, anemia, and end-stage renal disease on peritoneal dialysis.  Patient presents to the emergency room complaining of shortness of breath and increased edema.  Patient has been admitted for Fluid overload [E87.70]  [facility] PD  End stage renal disease  on peritoneal dialysis. No missed outpatient treatments. Decreased urination may have lead to fluid overload, insufficient dialysis. Will dialyze patient tonight with a 4.25% and 2.5% dialysate to optimize fluid removal.  Will also order IV furosemide 80 mg 3 times daily.  Will assess patient in am.   2. Anemia of chronic kidney disease  Lab Results  Component Value Date   HGB 6.4 (L) 01/24/2023    Hgb 6.4, primary team has ordered 1 unit blood transfusion.   3. Secondary Hyperparathyroidism: with outpatient labs: none  Lab Results  Component Value Date   CALCIUM 7.3 (L) 01/24/2023   CAION 1.26 10/07/2021  Will continue to monitor bone minerals.  4.  Hypertension with chronic kidney disease.  Home regimen includes amlodipine, carvedilol, clonidine, hydralazine, losartan, and metoprolol.   LOS: 0 Kyaira Trantham 12/17/20244:15 PM

## 2023-01-24 NOTE — Discharge Planning (Signed)
ESTABLISHED PERITONEAL Outpatient Facility [facility]  [address] [place], [place]. [address] [phone]  Dimas Chyle Dialysis Coordinator II  [facility] Cell: [phone] eFax: [phone] [email or web address]

## 2023-01-24 NOTE — ED Notes (Signed)
Meal tray ordered 

## 2023-01-24 NOTE — Progress Notes (Addendum)
Status update on blood availability: Patient had order for blood to be administered 12/17 0031 and has not yet received the unit. She has a rare antibody type. I checked with blood bank for update. Per blood bank they were able to secure two units which will be sent from distributor at 2am 12/18. These units once they arrive will still need to be crossmatched to ensure they can be administered. Per blood bank if they are a match will be available later in the morning 12/18.

## 2023-01-24 NOTE — Progress Notes (Signed)
  Peritoneal Dialysis Treatment Initiation Note     Consent signed and in chart.  PD treatment initiated via aseptic technique.    Patient is awake and alert. No complaints of pain.    PD exit site clean, dry and intact.  Gentamicin and new dressing applied.    Hand-off given to the patient's nurse.  Education provided to dept staff  regarding PD machine and how  to contact tech support if machine  alarms.      Lynann Beaver LPN [facility]

## 2023-01-24 NOTE — Assessment & Plan Note (Signed)
-   We will continue anti-hypertensive therapy. 

## 2023-01-24 NOTE — ED Notes (Signed)
Informed RN Candace via chat/ Pt has bed assigned

## 2023-01-24 NOTE — H&P (Addendum)
[place]   PATIENT NAME: Heidi [name]    MR#:  [id number]  DATE OF BIRTH:  [birth year]  DATE OF ADMISSION:  01/23/2023  PRIMARY CARE PHYSICIAN: Rayetta Humphrey, MD   Patient is coming from: Home  REQUESTING/REFERRING PHYSICIAN: Chiquita Loth, MD  CHIEF COMPLAINT:   Chief Complaint  Patient presents with   Shortness of Breath    HISTORY OF PRESENT ILLNESS:  Heidi [name] is a 67 y.o. female with medical history significant for asthma, osteoarthritis, GERD, hypertension, dyslipidemia, stage III CKD, who presented to the emergency room with acute onset of dyspnea with associated orthopnea and worsening lower extremity edema.  She admitted to paroxysmal nocturnal dyspnea as well as dyspnea on exertion.  No chest pain or palpitations.  No fever or chills.  No nausea or vomiting or abdominal pain.  She has been feeling fatigued and tired.  No bleeding diathesis.  No melena or bright red being per rectum.  No hematuria or flank pain.  No cough or wheezing or hemoptysis.  ED Course: When the patient came to the ER, BP was 147/82 with respiratory rate of 22 with otherwise normal vital signs.  Labs revealed mild hyponatremia and hypochloremia as well as hypokalemia, a blood glucose of 61 and BUN of 69 with creatinine 10.63 and BNP 1587.4, high-sensitivity troponin I was 28 and later 31 then 30.  CBC showed leukocytosis of 20.5 with neutrophilia and anemia with hemoglobin 7.1 hematocrit 21.1 compared to 8.8/26 on 10/07/2021. Blood group was O positive.  UA showed 11-20 WBCs and rare bacteria with more than 300 protein and trace leukocytes with negative nitrites. EKG as reviewed by me : EKG showed normal sinus rhythm with rate of 79 with mild ST segment elevation in lead II and Q waves in lead III.  Repeat EKG showed Sinus rhythm with rate of 88 with premature atrial complexes and Q waves in lead III with improved ST segment elevation in lead II. Imaging: Two-view chest x-ray showed  cardiomegaly with pulmonary edema and new small left greater than right pleural effusions.  The patient was given 40 mg of IV Lasix.  She was typed and crossmatch and will be transfused 1 unit of packed red blood cells.  Dr. Thedore Mins was notified about the patient and is aware.  She will be admitted to a progressive unit bed for further evaluation and management. PAST MEDICAL HISTORY:   Past Medical History:  Diagnosis Date   Allergic rhinitis    Arthritis    sacroiliac joint   Asthma    Breast mass in female 09/05/2014   Degenerative arthritis of lumbar spine    Diabetes mellitus without complication (HCC)    Genu valgus, congenital    GERD (gastroesophageal reflux disease)    Hyperlipidemia    Hypertension    Hyponatremia    Microalbuminuria    Pes planus    Renal disorder    CKD stage 3   Tricompartment degenerative joint disease of knee    Trochanteric bursitis of both hips    Volvulus (HCC)     PAST SURGICAL HISTORY:   Past Surgical History:  Procedure Laterality Date   ABDOMINAL HYSTERECTOMY     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   BREAST BIOPSY Right 1996   benign   BREAST BIOPSY Right 07/13/2022   stereo biopsy/ x clip/ path pending   BREAST BIOPSY Right 07/13/2022   MM RT BREAST BX W LOC DEV 1ST LESION  IMAGE BX SPEC STEREO GUIDE 07/13/2022 [facility]   BREAST EXCISIONAL BIOPSY Right 2016   broken ankle surgery Right 02/2021   CAPD INSERTION N/A 10/07/2021   Procedure: LAPAROSCOPIC INSERTION CONTINUOUS AMBULATORY PERITONEAL DIALYSIS  (CAPD) CATHETER;  Surgeon: Leafy Ro, MD;  Location: [facility];  Service: General;  Laterality: N/A;   COLONOSCOPY     COLONOSCOPY N/A 12/19/2019   Procedure: COLONOSCOPY;  Surgeon: Toledo, Boykin Nearing, MD;  Location: [facility];  Service: Gastroenterology;  Laterality: N/A;   EXPLORATORY LAPAROTOMY     of colon for torsion   INSERTION OF MESH  10/07/2021   Procedure: INSERTION OF MESH;  Surgeon: Leafy Ro, MD;   Location: [facility];  Service: General;;   NASAL FRACTURE SURGERY     UMBILICAL HERNIA REPAIR N/A 10/07/2021   Procedure: HERNIA REPAIR UMBILICAL ADULT;  Surgeon: Leafy Ro, MD;  Location: [facility];  Service: General;  Laterality: N/A;    SOCIAL HISTORY:   Social History   Tobacco Use   Smoking status: Never   Smokeless tobacco: Never  Substance Use Topics   Alcohol use: Yes    Comment: rarely    FAMILY HISTORY:   Family History  Problem Relation Age of Onset   Diabetes Mother    Diabetes Father    Cancer Father    Breast cancer Maternal Aunt     DRUG ALLERGIES:   Allergies  Allergen Reactions   Sulfa Antibiotics Nausea And Vomiting   Misc. Sulfonamide Containing Compounds Nausea And Vomiting    REVIEW OF SYSTEMS:   ROS As per history of present illness. All pertinent systems were reviewed above. Constitutional, HEENT, cardiovascular, respiratory, GI, GU, musculoskeletal, neuro, psychiatric, endocrine, integumentary and hematologic systems were reviewed and are otherwise negative/unremarkable except for positive findings mentioned above in the HPI.   MEDICATIONS AT HOME:   Prior to Admission medications   Medication Sig Start Date End Date Taking? Authorizing Provider  albuterol (VENTOLIN HFA) 108 (90 Base) MCG/ACT inhaler Inhale into the lungs every 6 (six) hours as needed for wheezing or shortness of breath.   Yes [provider]  calcitRIOL (ROCALTROL) 0.25 MCG capsule Take 0.25 mcg by mouth daily.   Yes [provider]  carvedilol (COREG) 12.5 MG tablet Take 12.5 mg by mouth in the morning, at noon, and at bedtime.   Yes [provider]  cloNIDine (CATAPRES) 0.1 MG tablet Take 0.1 mg by mouth 2 (two) times daily.   Yes [provider]  fluticasone (FLONASE) 50 MCG/ACT nasal spray Place into both nostrils daily.   Yes [provider]  hydrALAZINE (APRESOLINE) 25 MG tablet Take 25 mg by mouth 2 times daily at 12  noon and 4 pm.   Yes [provider]  losartan (COZAAR) 100 MG tablet Take by mouth. 04/12/16  Yes [provider]  lovastatin (MEVACOR) 20 MG tablet Take by mouth. 01/16/14 01/24/23 Yes [provider]  metoprolol tartrate (LOPRESSOR) 100 MG tablet Take 100 mg by mouth 2 (two) times daily. 02/02/17 01/24/23 Yes [provider]  NOVOLIN 70/30 (70-30) 100 UNIT/ML injection Inject into the skin.   Yes [provider]  Vitamin D, Ergocalciferol, (DRISDOL) 1.25 MG (50000 UNIT) CAPS capsule Take 50,000 Units by mouth every 7 (seven) days.   Yes [provider]  amLODipine (NORVASC) 10 MG tablet Take 10 mg by mouth daily. 01/16/14 10/07/21  [provider]  Lancets [facility] PLUS [place]) MISC  03/04/22   [provider]  NOVOLIN N 100 UNIT/ML injection Taking 20 units in am and 15 units at bedtime Patient not taking: Reported on 01/24/2023 04/20/17   [provider]  PRECISION QID TEST test strip 1 each (1 strip total) 3 (three) times daily Use as instructed. 03/04/22 06/17/23  [provider]      VITAL SIGNS:  Blood pressure (!) 151/78, pulse 80, temperature 98.5 F (36.9 C), temperature source Oral, resp. rate 19, SpO2 100%.  PHYSICAL EXAMINATION:  Physical Exam  GENERAL:  67 y.o.-year-old patient lying in the bed with no acute distress.  EYES: Pupils equal, round, reactive to light and accommodation. No scleral icterus. Extraocular muscles intact.  HEENT: Head atraumatic, normocephalic. Oropharynx and nasopharynx clear.  NECK:  Supple, no jugular venous distention. No thyroid enlargement, no tenderness.  LUNGS: Normal breath sounds bilaterally, no wheezing, rales,rhonchi or crepitation. No use of accessory muscles of respiration.  CARDIOVASCULAR: Regular rate and rhythm, S1, S2 normal. No murmurs, rubs, or gallops.  ABDOMEN: Soft, nondistended, nontender. Bowel sounds present. No organomegaly or  mass.  EXTREMITIES: No pedal edema, cyanosis, or clubbing.  NEUROLOGIC: Cranial nerves II through XII are intact. Muscle strength 5/5 in all extremities. Sensation intact. Gait not checked.  PSYCHIATRIC: The patient is alert and oriented x 3.  Normal affect and good eye contact. SKIN: No obvious rash, lesion, or ulcer.   LABORATORY PANEL:   CBC Recent Labs  Lab 01/23/23 1311  WBC 20.5*  HGB 7.1*  HCT 21.1*  PLT 319   ------------------------------------------------------------------------------------------------------------------  Chemistries  Recent Labs  Lab 01/23/23 1311  NA 132*  K 3.4*  CL 94*  CO2 25  GLUCOSE 61*  BUN 69*  CREATININE 10.63*  CALCIUM 7.7*   ------------------------------------------------------------------------------------------------------------------  Cardiac Enzymes No results for input(s): "TROPONINI" in the last 168 hours. ------------------------------------------------------------------------------------------------------------------  RADIOLOGY:  DG Chest 2 View Result Date: 01/23/2023 CLINICAL DATA:  Shortness of breath.  Intermittent chest pain. EXAM: CHEST - 2 VIEW COMPARISON:  Chest radiograph dated January 13, 2023. FINDINGS: The heart is enlarged. The mediastinal contours are within normal limits. Aortic atherosclerosis. Small left-greater-than-right pleural effusions basilar atelectasis. Mild bilateral bronchial thickening. No pneumothorax. No acute osseous abnormality. IMPRESSION: Cardiomegaly with pulmonary edema and new small left-greater-than-right pleural effusions. Electronically Signed   By: Hart Robinsons M.D.   On: 01/23/2023 15:22      IMPRESSION AND PLAN:  Assessment and Plan: * Fluid overload - This is clearly associated with end-stage renal disease on peritoneal dialysis. - The patient will be admitted to a progressive unit bed - The patient will be given IV Lasix, mainly for pulmonary congestion more than  diuresis. - Nephrology consult to be obtained. - Dr. Thedore Mins was notified about the patient.  Sepsis due to gram-negative UTI (HCC) - This is manifested by respiratory rate of 22 and heart rate of 97 upon presentation. - The patient will be placed on IV Rocephin. - We will follow blood and urine cultures.  Acute on chronic anemia - This is clearly symptomatic and therefore the patient was typed and crossmatch and will be transfused 1 unit of packed red blood cells. - We will follow posttransfusion H&H.   Type 2 diabetes mellitus with chronic kidney disease (HCC) - Patient will be placed on supplemental coverage with NovoLog. - We will continue basal coverage.  Essential hypertension - We will continue antihypertensive therapy.  Dyslipidemia - We will continue statin therapy.   DVT prophylaxis: SQ heparin Advanced Care Planning:  Code Status:  full code.  Family Communication:  The plan of care was discussed in details with the patient (and family). I answered all questions. The patient agreed to proceed with the above mentioned plan. Further management will depend upon hospital course. Disposition Plan: Back to previous home environment Consults called: Nephrology All the records are reviewed and case discussed with ED provider.  Status is: Inpatient   At the time of the admission, it appears that the appropriate admission status for this patient is inpatient.  This is judged to be reasonable and necessary in order to provide the required intensity of service to ensure the patient's safety given the presenting symptoms, physical exam findings and initial radiographic and laboratory data in the context of comorbid conditions.  The patient requires inpatient status due to high intensity of service, high risk of further deterioration and high frequency of surveillance required.  I certify that at the time of admission, it is my clinical judgment that the patient will require inpatient  hospital care extending more than 2 midnights.                            Dispo: The patient is from: Home              Anticipated d/c is to: Home              Patient currently is not medically stable to d/c.              Difficult to place patient: No  Hannah Beat M.D on 01/24/2023 at 4:45 AM  [facility]   From 7 PM-7 AM, contact night-coverage [email or web address]  CC: Primary care physician; Rayetta Humphrey, MD

## 2023-01-24 NOTE — Assessment & Plan Note (Signed)
-   This is manifested by respiratory rate of 22 and heart rate of 97 upon presentation. - The patient will be placed on IV Rocephin. - We will follow blood and urine cultures.

## 2023-01-24 NOTE — Inpatient Diabetes Management (Signed)
Inpatient Diabetes Program Recommendations  AACE/ADA: New Consensus Statement on Inpatient Glycemic Control   Target Ranges:  Prepandial:   less than 140 mg/dL      Peak postprandial:   less than 180 mg/dL (1-2 hours)      Critically ill patients:  140 - 180 mg/dL    Latest Reference Range & Units 01/24/23 07:09  Glucose-Capillary 70 - 99 mg/dL 99    Latest Reference Range & Units 01/23/23 13:11 01/24/23 04:45  Glucose 70 - 99 mg/dL 61 (L) 161 (H)   Review of Glycemic Control  Diabetes history: DM2 Outpatient Diabetes medications: 70/30 10 units QAM Current orders for Inpatient glycemic control: 70/30 10 units QAM  Inpatient Diabetes Program Recommendations:    Insulin: Please consider discontinuing 70/30 and ordering Novolog 0-9 units TID with meals and Novolog 0-5 units at bedtime.  Thanks, Orlando Penner, RN, MSN, CDCES Diabetes Coordinator Inpatient Diabetes Program [phone] (Team Pager from 8am to 5pm)

## 2023-01-24 NOTE — Assessment & Plan Note (Signed)
-   We will continue statin therapy. 

## 2023-01-24 NOTE — ED Notes (Signed)
Spoke to lab via secure chat, communicated to this RN that availability of blood may be delayed d/t uncommon antibodies. CBC to be rechecked this AM to monitor Hgb in meantime.

## 2023-01-24 NOTE — Assessment & Plan Note (Signed)
-   Patient will be placed on supplemental coverage with NovoLog. - We will continue basal coverage.

## 2023-01-24 NOTE — Assessment & Plan Note (Addendum)
-   This is clearly associated with end-stage renal disease on peritoneal dialysis. - The patient will be admitted to a progressive unit bed - The patient will be given IV Lasix, mainly for pulmonary congestion more than diuresis. - Nephrology consult to be obtained. - Dr. Thedore Mins was notified about the patient.

## 2023-01-24 NOTE — Assessment & Plan Note (Addendum)
-   This is clearly symptomatic and therefore the patient was typed and crossmatch and will be transfused 1 unit of packed red blood cells. - We will follow posttransfusion H&H.

## 2023-01-25 ENCOUNTER — Encounter: Payer: Self-pay | Admitting: Family Medicine

## 2023-01-25 ENCOUNTER — Inpatient Hospital Stay
Admit: 2023-01-25 | Discharge: 2023-01-25 | Disposition: A | Discharge status: Home / Self Care | Payer: Medicare HMO | Attending: Hospitalist

## 2023-01-25 DIAGNOSIS — I3139 Other pericardial effusion (noninflammatory): Secondary | ICD-10-CM

## 2023-01-25 DIAGNOSIS — E8779 Other fluid overload: Secondary | ICD-10-CM

## 2023-01-25 HISTORY — DX: Other pericardial effusion (noninflammatory): I31.39

## 2023-01-25 LAB — URINE CULTURE: Culture: <10000 — AB

## 2023-01-25 LAB — BASIC METABOLIC PANEL
Anion gap: 14 (ref 5–15)
BUN: 71 mg/dL — ABNORMAL HIGH (ref 8–23)
CO2: 24 mmol/L (ref 22–32)
Calcium: 7.3 mg/dL — ABNORMAL LOW (ref 8.9–10.3)
Chloride: 94 mmol/L — ABNORMAL LOW (ref 98–111)
Creatinine, Ser: 10.7 mg/dL — ABNORMAL HIGH (ref 0.44–1.00)
GFR, Estimated: 4 mL/min — ABNORMAL LOW (ref >60)
Glucose, Bld: 195 mg/dL — ABNORMAL HIGH (ref 70–99)
Potassium: 4 mmol/L (ref 3.5–5.1)
Sodium: 132 mmol/L — ABNORMAL LOW (ref 135–145)

## 2023-01-25 LAB — CBC
HCT: 18.8 % — ABNORMAL LOW (ref 36.0–46.0)
Hemoglobin: 6 g/dL — ABNORMAL LOW (ref 12.0–15.0)
MCH: 30 pg (ref 26.0–34.0)
MCHC: 31.9 g/dL (ref 30.0–36.0)
MCV: 94 fL (ref 80.0–100.0)
Platelets: 261 10*3/uL (ref 150–400)
RBC: 2 MIL/uL — ABNORMAL LOW (ref 3.87–5.11)
RDW: 14.8 % (ref 11.5–15.5)
WBC: 15 10*3/uL — ABNORMAL HIGH (ref 4.0–10.5)
nRBC: 0 % (ref 0.0–0.2)

## 2023-01-25 LAB — ECHOCARDIOGRAM COMPLETE
AR max vel: 2.92 cm2
AV Area VTI: 3.87 cm2
AV Area mean vel: 2.98 cm2
AV Mean grad: 3 mm[Hg]
AV Peak grad: 5.8 mm[Hg]
Ao pk vel: 1.21 m/s
Area-P 1/2: 2.55 cm2
MV VTI: 1.52 cm2
S' Lateral: 2.5 cm

## 2023-01-25 LAB — PREPARE RBC (CROSSMATCH)

## 2023-01-25 LAB — CBG MONITORING, ED: Glucose-Capillary: 184 mg/dL — ABNORMAL HIGH (ref 70–99)

## 2023-01-25 LAB — MAGNESIUM: Magnesium: 2 mg/dL (ref 1.7–2.4)

## 2023-01-25 LAB — HEPATITIS B SURFACE ANTIGEN: Hepatitis B Surface Ag: NONREACTIVE

## 2023-01-25 LAB — VITAMIN B12: Vitamin B-12: 338 pg/mL (ref 180–914)

## 2023-01-25 MED ORDER — VITAMIN B-12 1000 MCG PO TABS
1000.0000 ug | ORAL_TABLET | Freq: Every day | ORAL | Status: DC
  Administered 2023-01-26 – 2023-02-04 (×10): 1000 ug via ORAL
  Filled 2023-01-25 (×10): qty 1

## 2023-01-25 MED ORDER — FOLIC ACID 1 MG PO TABS
1.0000 mg | ORAL_TABLET | Freq: Every day | ORAL | Status: DC
  Administered 2023-01-26 – 2023-02-04 (×10): 1 mg via ORAL
  Filled 2023-01-25 (×10): qty 1

## 2023-01-25 NOTE — Progress Notes (Signed)
2230Patient currently with PD running. Called on call dialysis nurse to have patient disconnected/reconnected for transfer to new bed. No answer. Left voicemail.  Called again x 3 with no answer. Call went to voicemail. Charge Rn vanessa aware. She had attempted to reach them as well.  0030- Received call from [facility] Dialysis nurse. At this time treatment was now complete. Per Heidi [name] patient would have to be left attached to machine until she could get here and she was unsure how long that would be. Charge nurse present for conversation.

## 2023-01-25 NOTE — Progress Notes (Signed)
  PROGRESS NOTE    Heidi [name]  WUJ:[id number] DOB: 1955-11-08 DOA: 01/23/2023 PCP: Rayetta Humphrey, MD  201A/201A-AA  LOS: 1 day   Brief hospital course:   Assessment & Plan: MAZI BITTING is a 67 y.o. female with medical history significant for asthma, osteoarthritis, GERD, hypertension, dyslipidemia, stage III CKD, who presented to the emergency room with acute onset of dyspnea with associated orthopnea and worsening lower extremity edema.    * Fluid overload and pulm edema ESRD on peritoneal dialysis. --CXR showed "Cardiomegaly with pulmonary edema and new small left-greater-than-right pleural effusions."  Fluid overload likely due to decreased urination and/or insufficient PD at home. --Pt received IV lasix 40 on presentation.  Pt reported still making urine, but not much. Plan: --IV lasix 80 mg TID, per nephro --nephrology to optimize fluid removal with PD. --Echo  Acute on chronic anemia Folate def --no signs of bleeding.  Anemia workup showed def in folate (5.0), and low end of vit B12 (338). 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 Plan: --1u pRBC  --monitor Hgb and transfuse to keep Hgb >7 --start folic acid and vit B12 supplement  Sepsis, ruled out UTI, ruled out --urine cx with insignificant growth.  Abx d/c'ed.  Type 2 diabetes mellitus with chronic kidney disease (HCC) - recent A1c 5.6 --no need for BG checks  Essential hypertension --cont coreg, clonidine, losartan --hold amlodipine and hydralazine since BP intermittently low  Dyslipidemia - continue statin therapy.   DVT prophylaxis: Heparin SQ Code Status: Full code  Family Communication:  Level of care: Med-Surg Dispo:   The patient is from: home Anticipated d/c is to: home Anticipated d/c date is: 2-3 days Patient currently is not medically ready to d/c due to: needing blood transfusion, and fluid removal with inpatient PD.   Subjective and Interval  History:  Pt reported dyspnea improved.  No signs of bleeding.  Due to presence of rare antibody, pRBC was not available until today.     Objective: Vitals:   01/25/23 1215 01/25/23 1240 01/25/23 1450 01/25/23 1600  BP: (!) 109/58 (!) 109/58 (!) 116/56   Pulse: 67  67   Resp: 16  18   Temp: 97.6 F (36.4 C)  98.2 F (36.8 C)   TempSrc: Oral  Oral   SpO2:   96%   Height:    5\' 4"  (1.626 m)    Intake/Output Summary (Last 24 hours) at 01/25/2023 1800 Last data filed at 01/25/2023 1215 Gross per 24 hour  Intake 630 ml  Output 1043 ml  Net -413 ml   There were no vitals filed for this visit.  Examination:   Constitutional: NAD, AAOx3 HEENT: conjunctivae and lids normal, EOMI CV: No cyanosis.   RESP: normal respiratory effort, on 1L Neuro: II - XII grossly intact.   Psych: Normal mood and affect.  Appropriate judgement and reason   Data Reviewed: I have personally reviewed labs and imaging studies  Time spent: 50 minutes  Darlin Priestly, MD [facility] If 7PM-7AM, please contact night-coverage 01/25/2023, 6:00 PM

## 2023-01-25 NOTE — ED Notes (Signed)
This tech informed [name], RN of pt's CBG of 184 mg/dL.

## 2023-01-25 NOTE — Plan of Care (Signed)
°  Problem: Education: °Goal: Knowledge of General Education information will improve °Description: Including pain rating scale, medication(s)/side effects and non-pharmacologic comfort measures °Outcome: Progressing °  °Problem: Clinical Measurements: °Goal: Ability to maintain clinical measurements within normal limits will improve °Outcome: Progressing °  °Problem: Nutrition: °Goal: Adequate nutrition will be maintained °Outcome: Progressing °  °

## 2023-01-25 NOTE — Progress Notes (Signed)
  Peritoneal Dialysis Treatment Initiation Note     Pre Treatment Weight: 99.6 kg   Consent signed and in chart.  PD treatment initiated via aseptic technique.    Patient is awake and alert. No complaints of pain.    PD exit site clean, dry and intact.  Gentamicin and new dressing applied.    Hand-off given to the patient's nurse.  Education provided to dept staff  regarding PD machine and how  to contact tech support if machine  alarms.      Maple Hudson, RN [facility]

## 2023-01-25 NOTE — Progress Notes (Signed)
Peritoneal Dialysis Treatment Disconnect Note   Post-tx wt:98.2 kg Consent signed and in chart.   Gentamicin applied and new dressing applied.  Hand-off given to the pt's nurse. Education provided to dept staff Regarding PD machine and how to  Contact tech support if machine alarms. .  PD treatment disconnected via aseptic technique.     PD exit site clean, dry and intact.       Maple Hudson, RN [facility]

## 2023-01-25 NOTE — Progress Notes (Addendum)
[facility]  ROUNDING NOTE   Subjective:   Heidi [name] is a 67 y.o. female with past medical conditions including GERD, hypertension, dyslipidemia, asthma, osteoarthritis, anemia, and end-stage renal disease on peritoneal dialysis.  Patient presents to the emergency room complaining of shortness of breath and increased edema.  Patient has been admitted for Fluid overload [E87.70]  Patient is known to our practice and is followed by Dr Cherylann Ratel at [facility].   Patient seen laying in bed Alert and oriented Continues to complain of mild shortness of breath, states no improvement with PD treatment overnight. Remains on 1 L nasal cannula Extensive lower extremity edema remains.   Objective:  Vital signs in last 24 hours:  Temp:  [97.6 F (36.4 C)-98.7 F (37.1 C)] 97.6 F (36.4 C) (12/18 1215) Pulse Rate:  [54-77] 67 (12/18 1215) Resp:  [10-18] 16 (12/18 1215) BP: (103-144)/(56-67) 109/58 (12/18 1215) SpO2:  [92 %-100 %] 95 % (12/18 1200)  Weight change:  There were no vitals filed for this visit.  Intake/Output: I/O last 3 completed shifts: In: 100 [IV Piggyback:100] Out: -    Intake/Output this shift:  Total I/O In: 630 [Blood:630] Out: -   Physical Exam: General: NAD  Head: Normocephalic, atraumatic. Moist oral mucosal membranes  Eyes: Anicteric  Lungs:  Mild crackles, [place] O2  Heart: Regular rate and rhythm  Abdomen:  Soft, nontender, PDC  Extremities:  2+ peripheral edema.  Neurologic: Alert, moving all four extremities  Skin: No lesions  Access: PD catheter    Basic Metabolic Panel: Recent Labs  [name] 01/23/23 1311 01/24/23 0445 01/25/23 0617  NA 132* 133* 132*  K 3.4* 3.6 4.0  CL 94* 94* 94*  CO2 25 23 24   GLUCOSE 61* 114* 195*  BUN 69* 71* 71*  CREATININE 10.63* 10.91* 10.70*  CALCIUM 7.7* 7.3* 7.3*  MG  --   --  2.0    Liver Function Tests: Recent Labs  [name] 01/24/23 0445  AST 16  ALT 25  ALKPHOS 61  BILITOT 0.3  PROT  5.3*  ALBUMIN 1.9*   No results for input(s): "LIPASE", "AMYLASE" in the last 168 hours. No results for input(s): "AMMONIA" in the last 168 hours.  CBC: Recent Labs  [name] 01/23/23 1311 01/24/23 0445 01/25/23 0841  WBC 20.5* 16.4* 15.0*  NEUTROABS 18.1* 13.7*  --   HGB 7.1* 6.4* 6.0*  HCT 21.1* 19.8* 18.8*  MCV 87.9 92.1 94.0  PLT 319 301 261    Cardiac Enzymes: No results for input(s): "CKTOTAL", "CKMB", "CKMBINDEX", "TROPONINI" in the last 168 hours.  BNP: Invalid input(s): "POCBNP"  CBG: Recent Labs  [name] 01/24/23 0709 01/24/23 1229 01/24/23 1619 01/25/23 0804  GLUCAP 99 149* 175* 184*    Microbiology: Results for orders placed or performed during the hospital encounter of 01/23/23  SARS Coronavirus 2 by RT PCR (hospital order, performed in [name]) *cepheid single result test* Anterior Nasal Swab     Status: None   Collection Time: 01/23/23  1:08 PM   Specimen: Anterior Nasal Swab  Result Value Ref Range Status   SARS Coronavirus 2 by RT PCR NEGATIVE NEGATIVE Final    Comment: (NOTE) SARS-CoV-2 target nucleic acids are NOT DETECTED.  The SARS-CoV-2 RNA is generally detectable in upper and lower respiratory specimens during the acute phase of infection. The lowest concentration of SARS-CoV-2 viral copies this assay can detect is 250 copies / mL. A negative result does not preclude SARS-CoV-2 infection and should  not be used as the sole basis for treatment or other patient management decisions.  A negative result may occur with improper specimen collection / handling, submission of specimen other than nasopharyngeal swab, presence of viral mutation(s) within the areas targeted by this assay, and inadequate number of viral copies (<250 copies / mL). A negative result must be combined with clinical observations, patient history, and epidemiological information.  Fact Sheet for Patients:   RoadLapTop.co.za  Fact Sheet  for Healthcare Providers: http://kim-miller.com/  This test is not yet approved or  cleared by the [place] FDA and has been authorized for detection and/or diagnosis of SARS-CoV-2 by FDA under an Emergency Use Authorization (EUA).  This EUA will remain in effect (meaning this test can be used) for the duration of the COVID-19 declaration under Section 564(b)(1) of the Act, 21 U.S.C. section 360bbb-3(b)(1), unless the authorization is terminated or revoked sooner.  Performed at [facility], [name], [place], [place] [address]   Urine Culture (for pregnant, neutropenic or urologic patients or patients with an indwelling urinary catheter)     Status: Abnormal   Collection Time: 01/24/23  2:02 AM   Specimen: Urine, Catheterized  Result Value Ref Range Status   Specimen Description   Final    URINE, CATHETERIZED Performed at [facility], [address] [place], [place] [address]    Special Requests   Final    NONE Performed at [facility], [address], [place], [place] [address]    Culture (A)  Final    <10,000 COLONIES/mL INSIGNIFICANT GROWTH Performed at [name], [address], [place], [place] [address]    Report Status 01/25/2023 FINAL  Final  Culture, blood (x 2)     Status: None (Preliminary result)   Collection Time: 01/24/23  5:10 AM   Specimen: BLOOD  Result Value Ref Range Status   Specimen Description BLOOD LEFT ASSIST CONTROL  Final   Special Requests   Final    BOTTLES DRAWN AEROBIC AND ANAEROBIC Blood Culture results may not be optimal due to an inadequate volume of blood received in culture bottles   Culture   Final    NO GROWTH 1 DAY Performed at [facility], [address] [place], [place] [address]    Report Status PENDING  Incomplete  Culture, blood (x 2)     Status: None (Preliminary result)   Collection Time: 01/24/23  5:10 AM   Specimen: BLOOD  Result Value Ref  Range Status   Specimen Description BLOOD LEFT HAND  Final   Special Requests   Final    BOTTLES DRAWN AEROBIC AND ANAEROBIC Blood Culture results may not be optimal due to an inadequate volume of blood received in culture bottles   Culture   Final    NO GROWTH 1 DAY Performed at [facility], [address], [place], [place] [address]    Report Status PENDING  Incomplete    Coagulation Studies: Recent Labs    01/24/23 0510  LABPROT 14.4  INR 1.1    Urinalysis: Recent Labs    01/24/23 0202  COLORURINE YELLOW*  LABSPEC 1.012  PHURINE 5.0  GLUCOSEU NEGATIVE  HGBUR SMALL*  BILIRUBINUR NEGATIVE  KETONESUR NEGATIVE  PROTEINUR >=300*  NITRITE NEGATIVE  LEUKOCYTESUR TRACE*      Imaging: DG Chest 2 View Result Date: 01/23/2023 CLINICAL DATA:  Shortness of breath.  Intermittent chest pain. EXAM: CHEST - 2 VIEW COMPARISON:  Chest radiograph dated  January 13, 2023. FINDINGS: The heart is enlarged. The mediastinal contours are within normal limits. Aortic atherosclerosis. Small left-greater-than-right pleural effusions basilar atelectasis. Mild bilateral bronchial thickening. No pneumothorax. No acute osseous abnormality. IMPRESSION: Cardiomegaly with pulmonary edema and new small left-greater-than-right pleural effusions. Electronically Signed   By: Hart Robinsons M.D.   On: 01/23/2023 15:22     Medications:    dialysis solution 2.5% low-MG/low-CA     dialysis solution 4.25% low-MG/low-CA      amLODipine  10 mg Oral Daily   calcitRIOL  0.25 mcg Oral Daily   carvedilol  12.5 mg Oral TID   cloNIDine  0.1 mg Oral BID   fluticasone  2 spray Each Nare Daily   furosemide  80 mg Intravenous TID   gentamicin cream  1 Application Topical Daily   heparin  5,000 Units Subcutaneous Q8H   hydrALAZINE  25 mg Oral q12n4p   losartan  100 mg Oral Daily   pravastatin  20 mg Oral q1800   sodium chloride flush  10 mL Intravenous Q12H   [START ON 01/28/2023] Vitamin D  (Ergocalciferol)  50,000 Units Oral Q7 days   acetaminophen **OR** acetaminophen, albuterol, magnesium hydroxide, ondansetron **OR** ondansetron (ZOFRAN) IV, traZODone  Assessment/ Plan:  Heidi [name] is a 67 y.o.  female  with past medical conditions including GERD, hypertension, dyslipidemia, asthma, osteoarthritis, anemia, and end-stage renal disease on peritoneal dialysis.  Patient presents to the emergency room complaining of shortness of breath and increased edema.  Patient has been admitted for Fluid overload [E87.70]  [facility] PD  End stage renal disease on peritoneal dialysis. No missed outpatient treatments. Decreased urination may have lead to fluid overload, insufficient dialysis. Will dialyze patient tonight with a 4.25% and 2.5% dialysate to optimize fluid removal.  Will also order IV furosemide 80 mg 3 times daily.   Patient received PD treatment overnight, UF 1 L achieved..  Will maintain overnight treatment and add last fill of 2.5L of 2.5% dialysate.   2. Anemia of chronic kidney disease  [name] Results  Component Value Date   HGB 6.0 (L) 01/25/2023    Hgb 6.0, 1 unit blood transfusion received overnight.  3. Secondary Hyperparathyroidism: with outpatient labs: none  [name] Results  Component Value Date   CALCIUM 7.3 (L) 01/25/2023   CAION 1.26 10/07/2021  Will continue to monitor bone minerals.  4.  Hypertension with chronic kidney disease.  Home regimen includes amlodipine, carvedilol, clonidine, hydralazine, losartan, and metoprolol.  Blood pressure remains acceptable.   LOS: 1 Aveion Nguyen 12/18/202412:32 PM

## 2023-01-25 NOTE — Progress Notes (Signed)
*  PRELIMINARY RESULTS* Echocardiogram 2D Echocardiogram has been performed.  Heidi [name] 01/25/2023, 1:15 PM

## 2023-01-26 DIAGNOSIS — E8779 Other fluid overload: Secondary | ICD-10-CM | POA: Diagnosis not present

## 2023-01-26 DIAGNOSIS — I10 Essential (primary) hypertension: Secondary | ICD-10-CM | POA: Diagnosis not present

## 2023-01-26 DIAGNOSIS — E1122 Type 2 diabetes mellitus with diabetic chronic kidney disease: Secondary | ICD-10-CM | POA: Diagnosis not present

## 2023-01-26 DIAGNOSIS — D649 Anemia, unspecified: Secondary | ICD-10-CM | POA: Diagnosis not present

## 2023-01-26 DIAGNOSIS — Z794 Long term (current) use of insulin: Secondary | ICD-10-CM

## 2023-01-26 LAB — BASIC METABOLIC PANEL
Anion gap: 17 — ABNORMAL HIGH (ref 5–15)
BUN: 66 mg/dL — ABNORMAL HIGH (ref 8–23)
CO2: 21 mmol/L — ABNORMAL LOW (ref 22–32)
Calcium: 7.5 mg/dL — ABNORMAL LOW (ref 8.9–10.3)
Chloride: 92 mmol/L — ABNORMAL LOW (ref 98–111)
Creatinine, Ser: 11.19 mg/dL — ABNORMAL HIGH (ref 0.44–1.00)
GFR, Estimated: 3 mL/min — ABNORMAL LOW (ref >60)
Glucose, Bld: 418 mg/dL — ABNORMAL HIGH (ref 70–99)
Potassium: 3.8 mmol/L (ref 3.5–5.1)
Sodium: 130 mmol/L — ABNORMAL LOW (ref 135–145)

## 2023-01-26 LAB — CBC
HCT: 20.1 % — ABNORMAL LOW (ref 36.0–46.0)
Hemoglobin: 6.7 g/dL — ABNORMAL LOW (ref 12.0–15.0)
MCH: 29.9 pg (ref 26.0–34.0)
MCHC: 33.3 g/dL (ref 30.0–36.0)
MCV: 89.7 fL (ref 80.0–100.0)
Platelets: 244 10*3/uL (ref 150–400)
RBC: 2.24 MIL/uL — ABNORMAL LOW (ref 3.87–5.11)
RDW: 14.6 % (ref 11.5–15.5)
WBC: 14.8 10*3/uL — ABNORMAL HIGH (ref 4.0–10.5)
nRBC: 0 % (ref 0.0–0.2)

## 2023-01-26 LAB — GLUCOSE, CAPILLARY
Glucose-Capillary: 333 mg/dL — ABNORMAL HIGH (ref 70–99)
Glucose-Capillary: 327 mg/dL — ABNORMAL HIGH (ref 70–99)

## 2023-01-26 LAB — MAGNESIUM: Magnesium: 2 mg/dL (ref 1.7–2.4)

## 2023-01-26 LAB — HEPATITIS B SURFACE ANTIBODY, QUANTITATIVE: Hep B S AB Quant (Post): 167 m[IU]/mL

## 2023-01-26 MED ORDER — CARVEDILOL 12.5 MG PO TABS
12.5000 mg | ORAL_TABLET | Freq: Two times a day (BID) | ORAL | Status: DC
  Administered 2023-01-26 – 2023-02-03 (×13): 12.5 mg via ORAL
  Filled 2023-01-26 (×13): qty 1

## 2023-01-26 MED ORDER — LACTULOSE 10 GM/15ML PO SOLN
20.0000 g | Freq: Once | ORAL | Status: AC
Start: 2023-01-26 — End: 2023-01-26
  Administered 2023-01-26: 20 g via ORAL
  Filled 2023-01-26: qty 30

## 2023-01-26 MED ORDER — NEPRO/CARBSTEADY PO LIQD
237.0000 mL | Freq: Two times a day (BID) | ORAL | Status: DC
  Administered 2023-01-26 – 2023-02-04 (×13): 237 mL via ORAL

## 2023-01-26 MED ORDER — ONDANSETRON 4 MG PO TBDP: 4.0000 mg | ORAL_TABLET | Freq: Three times a day (TID) | ORAL | Status: DC | PRN

## 2023-01-26 MED ORDER — HEPARIN SODIUM (PORCINE) 5000 UNIT/ML IJ SOLN
5000.0000 [IU] | Freq: Two times a day (BID) | INTRAMUSCULAR | Status: DC
  Administered 2023-01-26 – 2023-01-31 (×9): 5000 [IU] via SUBCUTANEOUS
  Filled 2023-01-26 (×10): qty 1

## 2023-01-26 MED ORDER — SENNOSIDES-DOCUSATE SODIUM 8.6-50 MG PO TABS
2.0000 | ORAL_TABLET | Freq: Every day | ORAL | Status: DC
  Administered 2023-01-26 – 2023-01-31 (×6): 2 via ORAL
  Filled 2023-01-26 (×6): qty 2

## 2023-01-26 MED ORDER — INSULIN ASPART 100 UNIT/ML IJ SOLN
0.0000 [IU] | Freq: Every day | INTRAMUSCULAR | Status: DC
  Administered 2023-01-26: 4 [IU] via SUBCUTANEOUS
  Administered 2023-01-27: 3 [IU] via SUBCUTANEOUS
  Administered 2023-01-28 – 2023-01-29 (×2): 2 [IU] via SUBCUTANEOUS
  Filled 2023-01-26 (×5): qty 1

## 2023-01-26 MED ORDER — INSULIN ASPART 100 UNIT/ML IJ SOLN
0.0000 [IU] | Freq: Three times a day (TID) | INTRAMUSCULAR | Status: DC
  Administered 2023-01-26 – 2023-01-27 (×2): 4 [IU] via SUBCUTANEOUS
  Administered 2023-01-27: 5 [IU] via SUBCUTANEOUS
  Administered 2023-01-27: 1 [IU] via SUBCUTANEOUS
  Administered 2023-01-28: 5 [IU] via SUBCUTANEOUS
  Administered 2023-01-28 (×2): 4 [IU] via SUBCUTANEOUS
  Administered 2023-01-29 (×2): 5 [IU] via SUBCUTANEOUS
  Administered 2023-01-29: 2 [IU] via SUBCUTANEOUS
  Administered 2023-01-30: 3 [IU] via SUBCUTANEOUS
  Administered 2023-02-02: 1 [IU] via SUBCUTANEOUS
  Administered 2023-02-03: 2 [IU] via SUBCUTANEOUS
  Filled 2023-01-26 (×14): qty 1

## 2023-01-26 MED ORDER — ALBUMIN HUMAN 25 % IV SOLN
25.0000 g | Freq: Two times a day (BID) | INTRAVENOUS | Status: AC
  Administered 2023-01-26 – 2023-01-28 (×6): 25 g via INTRAVENOUS
  Filled 2023-01-26 (×6): qty 100

## 2023-01-26 NOTE — Inpatient Diabetes Management (Signed)
Inpatient Diabetes Program Recommendations  AACE/ADA: New Consensus Statement on Inpatient Glycemic Control   Target Ranges:  Prepandial:   less than 140 mg/dL      Peak postprandial:   less than 180 mg/dL (1-2 hours)      Critically ill patients:  140 - 180 mg/dL    Latest Reference Range & Units 01/26/23 04:54  Glucose 70 - 99 mg/dL 161 (H)    Latest Reference Range & Units 01/24/23 12:29 01/24/23 16:19 01/25/23 08:04  Glucose-Capillary 70 - 99 mg/dL 096 (H) 045 (H) 409 (H)   Review of Glycemic Control  Diabetes history: DM2 Outpatient Diabetes medications: 70/30 10 units QAM Current orders for Inpatient glycemic control: None  Inpatient Diabetes Program Recommendations:     Insulin: Lab glucose 418 mg/dl at 8:11 am today. No CBGs in chart since 12/18 at 8:04am.  Please consider ordering CBGs AC&HS and Novolog 0-15 units TID with meals and Novolog 0-5 units at bedtime.  Thanks, Orlando Penner, RN, MSN, CDCES Diabetes Coordinator Inpatient Diabetes Program [phone] (Team Pager from 8am to 5pm)

## 2023-01-26 NOTE — Progress Notes (Signed)
  Peritoneal Dialysis Treatment Initiation Note     Consent signed and in chart.  PD treatment initiated via aseptic technique.    Patient is awake and alert. No complaints of pain.    PD exit site clean, dry and intact.  Gentamicin and new dressing applied.    Hand-off given to the patient's nurse.  Education provided to dept staff  regarding PD machine and how  to contact tech support if machine  alarms.      Lynann Beaver LPN [facility]

## 2023-01-26 NOTE — Progress Notes (Signed)
[facility]  ROUNDING NOTE   Subjective:   Heidi [name] is a 67 y.o. female with past medical conditions including GERD, hypertension, dyslipidemia, asthma, osteoarthritis, anemia, and end-stage renal disease on peritoneal dialysis.  Patient presents to the emergency room complaining of shortness of breath and increased edema.  Patient has been admitted for Shortness of breath [R06.02] Fluid overload [E87.70] Symptomatic anemia [D64.9]  Patient is known to our practice and is followed by Dr Cherylann Ratel at [facility].   Patient seen resting in bed Remains on nasal cannula Appetite remains poor Feels slightly improved from previous day Lower extremity edema remains but slowly improving.   Objective:  Vital signs in last 24 hours:  Temp:  [97.6 F (36.4 C)-99 F (37.2 C)] 99 F (37.2 C) (12/19 0911) Pulse Rate:  [65-73] 70 (12/19 0911) Resp:  [16-20] 17 (12/19 0911) BP: (95-128)/(55-80) 128/63 (12/19 0911) SpO2:  [92 %-99 %] 99 % (12/19 0911) Weight:  [99.6 kg-103.1 kg] 100 kg (12/19 0815)  Weight change:  Filed Weights   01/25/23 1936 01/26/23 0356 01/26/23 0815  Weight: 99.6 kg 103.1 kg 100 kg    Intake/Output: I/O last 3 completed shifts: In: 630 [Blood:630] Out: 1243 [Urine:200; Other:1043]   Intake/Output this shift:  Total I/O In: -  Out: 1189 [Other:1189]  Physical Exam: General: NAD  Head: Normocephalic, atraumatic. Moist oral mucosal membranes  Eyes: Anicteric  Lungs:  Mild crackles, [place] O2  Heart: Regular rate and rhythm  Abdomen:  Soft, nontender, PDC  Extremities:  2+ peripheral edema.  Neurologic: Alert, moving all four extremities  Skin: No lesions  Access: PD catheter    Basic Metabolic Panel: Recent Labs  Lab 01/23/23 1311 01/24/23 0445 01/25/23 0617 01/26/23 0454  NA 132* 133* 132* 130*  K 3.4* 3.6 4.0 3.8  CL 94* 94* 94* 92*  CO2 25 23 24  21*  GLUCOSE 61* 114* 195* 418*  BUN 69* 71* 71* 66*  CREATININE 10.63*  10.91* 10.70* 11.19*  CALCIUM 7.7* 7.3* 7.3* 7.5*  MG  --   --  2.0 2.0    Liver Function Tests: Recent Labs  Lab 01/24/23 0445  AST 16  ALT 25  ALKPHOS 61  BILITOT 0.3  PROT 5.3*  ALBUMIN 1.9*   No results for input(s): "LIPASE", "AMYLASE" in the last 168 hours. No results for input(s): "AMMONIA" in the last 168 hours.  CBC: Recent Labs  Lab 01/23/23 1311 01/24/23 0445 01/25/23 0841 01/26/23 0454  WBC 20.5* 16.4* 15.0* 14.8*  NEUTROABS 18.1* 13.7*  --   --   HGB 7.1* 6.4* 6.0* 6.7*  HCT 21.1* 19.8* 18.8* 20.1*  MCV 87.9 92.1 94.0 89.7  PLT 319 301 261 244    Cardiac Enzymes: No results for input(s): "CKTOTAL", "CKMB", "CKMBINDEX", "TROPONINI" in the last 168 hours.  BNP: Invalid input(s): "POCBNP"  CBG: Recent Labs  Lab 01/24/23 0709 01/24/23 1229 01/24/23 1619 01/25/23 0804  GLUCAP 99 149* 175* 184*    Microbiology: Results for orders placed or performed during the hospital encounter of 01/23/23  SARS Coronavirus 2 by RT PCR (hospital order, performed in [facility] hospital lab) *cepheid single result test* Anterior Nasal Swab     Status: None   Collection Time: 01/23/23  1:08 PM   Specimen: Anterior Nasal Swab  Result Value Ref Range Status   SARS Coronavirus 2 by RT PCR NEGATIVE NEGATIVE Final    Comment: (NOTE) SARS-CoV-2 target nucleic acids are NOT DETECTED.  The SARS-CoV-2 RNA is  generally detectable in upper and lower respiratory specimens during the acute phase of infection. The lowest concentration of SARS-CoV-2 viral copies this assay can detect is 250 copies / mL. A negative result does not preclude SARS-CoV-2 infection and should not be used as the sole basis for treatment or other patient management decisions.  A negative result may occur with improper specimen collection / handling, submission of specimen other than nasopharyngeal swab, presence of viral mutation(s) within the areas targeted by this assay, and inadequate number of  viral copies (<250 copies / mL). A negative result must be combined with clinical observations, patient history, and epidemiological information.  Fact Sheet for Patients:   RoadLapTop.co.za  Fact Sheet for Healthcare Providers: http://kim-miller.com/  This test is not yet approved or  cleared by the [place] FDA and has been authorized for detection and/or diagnosis of SARS-CoV-2 by FDA under an Emergency Use Authorization (EUA).  This EUA will remain in effect (meaning this test can be used) for the duration of the COVID-19 declaration under Section 564(b)(1) of the Act, 21 U.S.C. section 360bbb-3(b)(1), unless the authorization is terminated or revoked sooner.  Performed at [facility], [address], [place], [place] [address]   Urine Culture (for pregnant, neutropenic or urologic patients or patients with an indwelling urinary catheter)     Status: Abnormal   Collection Time: 01/24/23  2:02 AM   Specimen: Urine, Catheterized  Result Value Ref Range Status   Specimen Description   Final    URINE, CATHETERIZED Performed at [facility], [address], [place], [place] [address]    Special Requests   Final    NONE Performed at [facility], [address] [place], [place] [address]    Culture (A)  Final    <10,000 COLONIES/mL INSIGNIFICANT GROWTH Performed at [facility], [address] [place], [place] [address]    Report Status 01/25/2023 FINAL  Final  Culture, blood (x 2)     Status: None (Preliminary result)   Collection Time: 01/24/23  5:10 AM   Specimen: BLOOD  Result Value Ref Range Status   Specimen Description BLOOD LEFT ASSIST CONTROL  Final   Special Requests   Final    BOTTLES DRAWN AEROBIC AND ANAEROBIC Blood Culture results may not be optimal due to an inadequate volume of blood received in culture bottles   Culture   Final    NO GROWTH 2 DAYS Performed at [facility], [address], [place], [place] [address]    Report Status PENDING  Incomplete  Culture, blood (x 2)     Status: None (Preliminary result)   Collection Time: 01/24/23  5:10 AM   Specimen: BLOOD  Result Value Ref Range Status   Specimen Description BLOOD LEFT HAND  Final   Special Requests   Final    BOTTLES DRAWN AEROBIC AND ANAEROBIC Blood Culture results may not be optimal due to an inadequate volume of blood received in culture bottles   Culture   Final    NO GROWTH 2 DAYS Performed at [facility], [address], [place], [place] [address]    Report Status PENDING  Incomplete    Coagulation Studies: Recent Labs    01/24/23 0510  LABPROT 14.4  INR 1.1    Urinalysis: Recent Labs    01/24/23 0202  COLORURINE YELLOW*  LABSPEC 1.012  PHURINE 5.0  GLUCOSEU NEGATIVE  HGBUR SMALL*  BILIRUBINUR NEGATIVE  KETONESUR NEGATIVE  PROTEINUR >=  300*  NITRITE NEGATIVE  LEUKOCYTESUR TRACE*      Imaging: ECHOCARDIOGRAM COMPLETE Result Date: 01/25/2023    ECHOCARDIOGRAM REPORT   Patient Name:   JULEANA COOKE Date of Exam: 01/25/2023 Medical Rec #:  [id number]       Height:       64.0 in Accession #:    [id number]      Weight:       211.0 lb Date of Birth:  12/30/1955        BSA:          2.001 m Patient Age:    67 years        BP:           144/61 mmHg Patient Gender: F               HR:           74 bpm. Exam Location:  [facility] Procedure: 2D Echo, Cardiac Doppler and Color Doppler Indications:     Dyspnea R06.00  History:         Patient has no prior history of Echocardiogram examinations.                  Risk Factors:Diabetes, Hypertension and Dyslipidemia.  Sonographer:     Cristela Blue Referring Phys:  [id number] TINA LAI Diagnosing Phys: Mellody Drown Alluri IMPRESSIONS  1. Left ventricular ejection fraction, by estimation, is 60 to 65%. The left ventricle has normal function. The left ventricle has no regional wall motion abnormalities. There is moderate left  ventricular hypertrophy. Left ventricular diastolic parameters are consistent with Grade II diastolic dysfunction (pseudonormalization).  2. Right ventricular systolic function is normal. The right ventricular size is normal.  3. Left atrial size was mildly dilated.  4. Moderate pericardial effusion. The pericardial effusion is circumferential. There is no evidence of cardiac tamponade. Moderate pleural effusion in the left lateral region.  5. The mitral valve is normal in structure. Mild mitral valve regurgitation. Severe mitral annular calcification.  6. The aortic valve is tricuspid. Aortic valve regurgitation is not visualized. FINDINGS  Left Ventricle: Left ventricular ejection fraction, by estimation, is 60 to 65%. The left ventricle has normal function. The left ventricle has no regional wall motion abnormalities. The left ventricular internal cavity size was normal in size. There is  moderate left ventricular hypertrophy. Left ventricular diastolic parameters are consistent with Grade II diastolic dysfunction (pseudonormalization). Right Ventricle: The right ventricular size is normal. No increase in right ventricular wall thickness. Right ventricular systolic function is normal. Left Atrium: Left atrial size was mildly dilated. Right Atrium: Right atrial size was normal in size. Pericardium: A moderately sized pericardial effusion is present. The pericardial effusion is circumferential. There is no evidence of cardiac tamponade. Mitral Valve: The mitral valve is normal in structure. Severe mitral annular calcification. Mild mitral valve regurgitation. MV peak gradient, 12.4 mmHg. The mean mitral valve gradient is 5.0 mmHg. Tricuspid Valve: The tricuspid valve is normal in structure. Tricuspid valve regurgitation is mild. Aortic Valve: The aortic valve is tricuspid. Aortic valve regurgitation is not visualized. Aortic valve mean gradient measures 3.0 mmHg. Aortic valve peak gradient measures 5.8 mmHg. Aortic  valve area, by VTI measures 3.87 cm. Pulmonic Valve: The pulmonic valve was not well visualized. Pulmonic valve regurgitation is mild. Aorta: The aortic root is normal in size and structure. IAS/Shunts: The interatrial septum was not assessed. Additional Comments: There is a moderate pleural effusion in the left lateral region.  LEFT  VENTRICLE PLAX 2D LVIDd:         3.80 cm   Diastology LVIDs:         2.50 cm   LV e' medial:    5.87 cm/s LV PW:         1.40 cm   LV E/e' medial:  19.3 LV IVS:        1.40 cm   LV e' lateral:   5.98 cm/s LVOT diam:     2.00 cm   LV E/e' lateral: 18.9 LV SV:         90 LV SV Index:   45 LVOT Area:     3.14 cm  RIGHT VENTRICLE RV Basal diam:  4.10 cm RV Mid diam:    4.20 cm RV S prime:     9.68 cm/s TAPSE (M-mode): 2.4 cm LEFT ATRIUM             Index        RIGHT ATRIUM           Index LA diam:        4.40 cm 2.20 cm/m   RA Area:     14.30 cm LA Vol (A2C):   64.1 ml 32.03 ml/m  RA Volume:   31.20 ml  15.59 ml/m LA Vol (A4C):   72.2 ml 36.07 ml/m LA Biplane Vol: 68.4 ml 34.18 ml/m  AORTIC VALVE AV Area (Vmax):    2.92 cm AV Area (Vmean):   2.98 cm AV Area (VTI):     3.87 cm AV Vmax:           120.50 cm/s AV Vmean:          83.700 cm/s AV VTI:            0.234 m AV Peak Grad:      5.8 mmHg AV Mean Grad:      3.0 mmHg LVOT Vmax:         112.00 cm/s LVOT Vmean:        79.400 cm/s LVOT VTI:          0.288 m LVOT/AV VTI ratio: 1.23  AORTA Ao Root diam: 2.70 cm MITRAL VALVE                TRICUSPID VALVE MV Area (PHT): 2.55 cm     TR Peak grad:   21.3 mmHg MV Area VTI:   1.52 cm     TR Vmax:        231.00 cm/s MV Peak grad:  12.4 mmHg MV Mean grad:  5.0 mmHg     SHUNTS MV Vmax:       1.76 m/s     Systemic VTI:  0.29 m MV Vmean:      97.3 cm/s    Systemic Diam: 2.00 cm MV Decel Time: 298 msec MV E velocity: 113.00 cm/s MV A velocity: 147.00 cm/s MV E/A ratio:  0.77 Mellody Drown Alluri Electronically signed by Windell Norfolk Signature Date/Time: 01/25/2023/4:37:13 PM    Final       Medications:    dialysis solution 2.5% low-MG/low-CA     dialysis solution 4.25% low-MG/low-CA      calcitRIOL  0.25 mcg Oral Daily   carvedilol  12.5 mg Oral TID   cloNIDine  0.1 mg Oral BID   vitamin B-12  1,000 mcg Oral Daily   fluticasone  2 spray Each Nare Daily   folic acid  1 mg Oral Daily   furosemide  80  mg Intravenous TID   gentamicin cream  1 Application Topical Daily   heparin  5,000 Units Subcutaneous Q8H   losartan  100 mg Oral Daily   pravastatin  20 mg Oral q1800   senna-docusate  2 tablet Oral Daily   sodium chloride flush  10 mL Intravenous Q12H   [START ON 01/28/2023] Vitamin D (Ergocalciferol)  50,000 Units Oral Q7 days   acetaminophen **OR** acetaminophen, albuterol, magnesium hydroxide, ondansetron **OR** ondansetron (ZOFRAN) IV, traZODone  Assessment/ Plan:  Heidi [name] is a 67 y.o.  female  with past medical conditions including GERD, hypertension, dyslipidemia, asthma, osteoarthritis, anemia, and end-stage renal disease on peritoneal dialysis.  Patient presents to the emergency room complaining of shortness of breath and increased edema.  Patient has been admitted for Shortness of breath [R06.02] Fluid overload [E87.70] Symptomatic anemia [D64.9]  [facility] PD  End stage renal disease on peritoneal dialysis. No missed outpatient treatments. Decreased urination may have lead to fluid overload, insufficient dialysis. Will dialyze patient tonight with a 4.25% and 2.5% dialysate to optimize fluid removal.  Will also order IV furosemide 80 mg 3 times daily.   Successful PD treatment received overnight, UF 1189 mL achieved.  Patient also received a last fill of 2.5 L, 3.2 L were drained.  Will continue with current regimen.  Will continue IV furosemide as ordered and will order albumin 25 g to facilitate fluid removal.  2. Anemia of chronic kidney disease  Lab Results  Component Value Date   HGB 6.7 (L) 01/26/2023    Hgb 6.7, 1 unit blood transfusion  given on 01/25/23.  3. Secondary Hyperparathyroidism: with outpatient labs: none  Lab Results  Component Value Date   CALCIUM 7.5 (L) 01/26/2023   CAION 1.26 10/07/2021  Corrected calcium of 9.2.  4.  Hypertension with chronic kidney disease.  Home regimen includes amlodipine, carvedilol, clonidine, hydralazine, losartan, and metoprolol.  Blood pressure 128/63.   LOS: 2 Marguerita Stapp 12/19/202410:59 AM

## 2023-01-26 NOTE — Progress Notes (Signed)
Peritoneal Dialysis  Post Treatment Note:  Last fill was drained. Output is 3.2L.  PD effluent is clear yellow. PD exit site clean, dry and intact.    Ralene Muskrat RN [facility]

## 2023-01-26 NOTE — Progress Notes (Signed)
Peritoneal Dialysis  Post Treatment Note:  PD treatment completed. Patient tolerated treatment well.   PD effluent is yellow with some fibrins. PD exit site clean, dry and intact.   No specimen collected.  Patient is awake and alert and no acute distress.  Hand-off given to patient's nurse.    Total UF removed: 1189 ml Post treatment weight: 100 kg   Ralene Muskrat RN [facility]

## 2023-01-26 NOTE — TOC CM/SW Note (Signed)
Transition of Care [facility]) - Inpatient Brief Assessment   Patient Details  Name: Heidi [name] MRN: [id number] Date of Birth: Apr 09, 1955  Transition of Care [facility]) CM/SW Contact:    [name], RN Phone Number: 01/26/2023, 11:13 AM   Clinical Narrative:  Patient currently on acute O2.  Please consult TOC if home O2 or any other needs arise  Transition of Care Asessment: Insurance and Status: Insurance coverage has been reviewed Patient has primary care physician: Yes     Prior/Current Home Services: No current home services Social Drivers of Health Review: SDOH reviewed no interventions necessary Readmission risk has been reviewed: Yes Transition of care needs: no transition of care needs at this time

## 2023-01-26 NOTE — Progress Notes (Signed)
Progress Note   Patient: Heidi [name] DOB: 1955/06/14 DOA: 01/23/2023     2 DOS: the patient was seen and examined on 01/26/2023   Brief hospital course: Heidi [name] is a 67 y.o. female with medical history significant for asthma, osteoarthritis, GERD, hypertension, dyslipidemia, stage III CKD, who presented to the emergency room with acute onset of dyspnea with associated orthopnea and worsening lower extremity edema.    Assessment & Plan:  * Fluid overload and pulm edema ESRD on peritoneal dialysis. Fluid overload likely due to decreased urination and/or insufficient PD at home. Echo shows EF 60%, grade 2 diastolic dysfunction. She had PD overnight with good UF achieved. Continue IV lasix 80 mg TID, albumin per nephrology team. Continue to optimize fluid removal with PD.   Acute on chronic anemia Folate def No active bleeding.  Anemia workup showed def in folate (5.0), and low end of vit B12 (338). 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 Plan: S/p 1u pRBC  HB today 6.7. Continue to monitor Hgb and transfuse to keep Hgb >6.5 Continue folic acid and vit B12 supplement   Sepsis, ruled out UTI, ruled out Urine Cultures show insignificant growth.   No antibiotics needed.   Type 2 diabetes mellitus with chronic kidney disease (HCC) A1c 5.6 from 10/13/22, will check A1c add to prior labs. BMP sugars 418, she is on Novolin N 10 units AM. Will start accucheks, sliding scale insulin ordered. Hypoglycemia protocol.   Essential hypertension Continue coreg, clonidine, losartan Hold amlodipine and hydralazine since BP intermittently low   Dyslipidemia Continue statin therapy.      Continue constipation regimen, antiemetics.  Out of bed to chair. Incentive spirometry. Nursing supportive care. Fall, aspiration precautions. DVT prophylaxis   Code Status: Full Code.  Subjective: Patient is seen and examined today morning.  She has abdominal discomfort, distention, nausea. Has constipation. Mild short of breath. Advised out of bed, incentive spirometry.  Physical Exam: Vitals:   01/26/23 0810 01/26/23 0814 01/26/23 0815 01/26/23 0911  BP:    128/63  Pulse:    70  Resp:    17  Temp:    99 F (37.2 C)  TempSrc:    Oral  SpO2: 96% 92% 94% 99%  Weight:   100 kg   Height:        General - Elderly African American obese female, mild distress due to abdominal discomfort. HEENT - PERRLA, EOMI, atraumatic head, non tender sinuses. Lung - Clear, diffuse rales, rhonchi, no wheezes. Heart - S1, S2 heard, no murmurs, rubs, 1+ pedal edema. Abdomen - Soft, non tender, distended, bowel sounds poor Neuro - Alert, awake and oriented x 3, non focal exam. Skin - Warm and dry.  Data Reviewed:      Latest Ref Rng & Units 01/26/2023    4:54 AM 01/25/2023    8:41 AM 01/24/2023    4:45 AM  CBC  WBC 4.0 - 10.5 K/uL 14.8  15.0  16.4   Hemoglobin 12.0 - 15.0 g/dL 6.7  6.0  6.4   Hematocrit 36.0 - 46.0 % 20.1  18.8  19.8   Platelets 150 - 400 K/uL 244  261  301       Latest Ref Rng & Units 01/26/2023    4:54 AM 01/25/2023    6:17 AM 01/24/2023    4:45 AM  BMP  Glucose 70 - 99 mg/dL 595  638  756   BUN 8 -  23 mg/dL 66  71  71   Creatinine 0.44 - 1.00 mg/dL 40.98  11.91  47.82   Sodium 135 - 145 mmol/L 130  132  133   Potassium 3.5 - 5.1 mmol/L 3.8  4.0  3.6   Chloride 98 - 111 mmol/L 92  94  94   CO2 22 - 32 mmol/L 21  24  23    Calcium 8.9 - 10.3 mg/dL 7.5  7.3  7.3    ECHOCARDIOGRAM COMPLETE Result Date: 01/25/2023    ECHOCARDIOGRAM REPORT   Patient Name:   Heidi [name] Date of Exam: 01/25/2023 Medical Rec #:  [id number]       Height:       64.0 in Accession #:    [id number]      Weight:       211.0 lb Date of Birth:  04/06/1955        BSA:          2.001 m Patient Age:    67 years        BP:           144/61 mmHg Patient Gender: F               HR:           74 bpm. Exam Location:  [facility] Procedure: 2D Echo,  Cardiac Doppler and Color Doppler Indications:     Dyspnea R06.00  History:         Patient has no prior history of Echocardiogram examinations.                  Risk Factors:Diabetes, Hypertension and Dyslipidemia.  Sonographer:     Cristela Blue Referring Phys:  [id number] TINA LAI Diagnosing Phys: Mellody Drown Alluri IMPRESSIONS  1. Left ventricular ejection fraction, by estimation, is 60 to 65%. The left ventricle has normal function. The left ventricle has no regional wall motion abnormalities. There is moderate left ventricular hypertrophy. Left ventricular diastolic parameters are consistent with Grade II diastolic dysfunction (pseudonormalization).  2. Right ventricular systolic function is normal. The right ventricular size is normal.  3. Left atrial size was mildly dilated.  4. Moderate pericardial effusion. The pericardial effusion is circumferential. There is no evidence of cardiac tamponade. Moderate pleural effusion in the left lateral region.  5. The mitral valve is normal in structure. Mild mitral valve regurgitation. Severe mitral annular calcification.  6. The aortic valve is tricuspid. Aortic valve regurgitation is not visualized. FINDINGS  Left Ventricle: Left ventricular ejection fraction, by estimation, is 60 to 65%. The left ventricle has normal function. The left ventricle has no regional wall motion abnormalities. The left ventricular internal cavity size was normal in size. There is  moderate left ventricular hypertrophy. Left ventricular diastolic parameters are consistent with Grade II diastolic dysfunction (pseudonormalization). Right Ventricle: The right ventricular size is normal. No increase in right ventricular wall thickness. Right ventricular systolic function is normal. Left Atrium: Left atrial size was mildly dilated. Right Atrium: Right atrial size was normal in size. Pericardium: A moderately sized pericardial effusion is present. The pericardial effusion is circumferential. There is no  evidence of cardiac tamponade. Mitral Valve: The mitral valve is normal in structure. Severe mitral annular calcification. Mild mitral valve regurgitation. MV peak gradient, 12.4 mmHg. The mean mitral valve gradient is 5.0 mmHg. Tricuspid Valve: The tricuspid valve is normal in structure. Tricuspid valve regurgitation is mild. Aortic Valve: The aortic valve is tricuspid. Aortic valve regurgitation is not  visualized. Aortic valve mean gradient measures 3.0 mmHg. Aortic valve peak gradient measures 5.8 mmHg. Aortic valve area, by VTI measures 3.87 cm. Pulmonic Valve: The pulmonic valve was not well visualized. Pulmonic valve regurgitation is mild. Aorta: The aortic root is normal in size and structure. IAS/Shunts: The interatrial septum was not assessed. Additional Comments: There is a moderate pleural effusion in the left lateral region.  LEFT VENTRICLE PLAX 2D LVIDd:         3.80 cm   Diastology LVIDs:         2.50 cm   LV e' medial:    5.87 cm/s LV PW:         1.40 cm   LV E/e' medial:  19.3 LV IVS:        1.40 cm   LV e' lateral:   5.98 cm/s LVOT diam:     2.00 cm   LV E/e' lateral: 18.9 LV SV:         90 LV SV Index:   45 LVOT Area:     3.14 cm  RIGHT VENTRICLE RV Basal diam:  4.10 cm RV Mid diam:    4.20 cm RV S prime:     9.68 cm/s TAPSE (M-mode): 2.4 cm LEFT ATRIUM             Index        RIGHT ATRIUM           Index LA diam:        4.40 cm 2.20 cm/m   RA Area:     14.30 cm LA Vol (A2C):   64.1 ml 32.03 ml/m  RA Volume:   31.20 ml  15.59 ml/m LA Vol (A4C):   72.2 ml 36.07 ml/m LA Biplane Vol: 68.4 ml 34.18 ml/m  AORTIC VALVE AV Area (Vmax):    2.92 cm AV Area (Vmean):   2.98 cm AV Area (VTI):     3.87 cm AV Vmax:           120.50 cm/s AV Vmean:          83.700 cm/s AV VTI:            0.234 m AV Peak Grad:      5.8 mmHg AV Mean Grad:      3.0 mmHg LVOT Vmax:         112.00 cm/s LVOT Vmean:        79.400 cm/s LVOT VTI:          0.288 m LVOT/AV VTI ratio: 1.23  AORTA Ao Root diam: 2.70 cm MITRAL VALVE                 TRICUSPID VALVE MV Area (PHT): 2.55 cm     TR Peak grad:   21.3 mmHg MV Area VTI:   1.52 cm     TR Vmax:        231.00 cm/s MV Peak grad:  12.4 mmHg MV Mean grad:  5.0 mmHg     SHUNTS MV Vmax:       1.76 m/s     Systemic VTI:  0.29 m MV Vmean:      97.3 cm/s    Systemic Diam: 2.00 cm MV Decel Time: 298 msec MV E velocity: 113.00 cm/s MV A velocity: 147.00 cm/s MV E/A ratio:  0.77 Windell Norfolk Electronically signed by Windell Norfolk Signature Date/Time: 01/25/2023/4:37:13 PM    Final      Family Communication: Discussed with patient, she  understand and agree. All questions answereed.    Disposition: Status is: Inpatient Remains inpatient appropriate because: volume management, constipation, PT eval  Planned Discharge Destination: Home with Home Health     Time spent: 40 minutes  Author: Marcelino Duster, MD 01/26/2023 2:30 PM Secure chat 7am to 7pm For on call review [email or web address].

## 2023-01-26 NOTE — Plan of Care (Signed)

## 2023-01-26 NOTE — Progress Notes (Signed)
Patient having nausea. Order received from MD for zofran. Incentive spirometer also ordered

## 2023-01-27 DIAGNOSIS — E1122 Type 2 diabetes mellitus with diabetic chronic kidney disease: Secondary | ICD-10-CM | POA: Diagnosis not present

## 2023-01-27 DIAGNOSIS — I10 Essential (primary) hypertension: Secondary | ICD-10-CM | POA: Diagnosis not present

## 2023-01-27 DIAGNOSIS — D649 Anemia, unspecified: Secondary | ICD-10-CM | POA: Diagnosis not present

## 2023-01-27 DIAGNOSIS — E8779 Other fluid overload: Secondary | ICD-10-CM | POA: Diagnosis not present

## 2023-01-27 LAB — GLUCOSE, CAPILLARY
Glucose-Capillary: 384 mg/dL — ABNORMAL HIGH (ref 70–99)
Glucose-Capillary: 312 mg/dL — ABNORMAL HIGH (ref 70–99)
Glucose-Capillary: 164 mg/dL — ABNORMAL HIGH (ref 70–99)
Glucose-Capillary: 273 mg/dL — ABNORMAL HIGH (ref 70–99)

## 2023-01-27 LAB — BASIC METABOLIC PANEL
Anion gap: 16 — ABNORMAL HIGH (ref 5–15)
BUN: 62 mg/dL — ABNORMAL HIGH (ref 8–23)
CO2: 24 mmol/L (ref 22–32)
Calcium: 8 mg/dL — ABNORMAL LOW (ref 8.9–10.3)
Chloride: 93 mmol/L — ABNORMAL LOW (ref 98–111)
Creatinine, Ser: 10.45 mg/dL — ABNORMAL HIGH (ref 0.44–1.00)
GFR, Estimated: 4 mL/min — ABNORMAL LOW (ref >60)
Glucose, Bld: 397 mg/dL — ABNORMAL HIGH (ref 70–99)
Potassium: 3.7 mmol/L (ref 3.5–5.1)
Sodium: 133 mmol/L — ABNORMAL LOW (ref 135–145)

## 2023-01-27 LAB — CBC
HCT: 20.6 % — ABNORMAL LOW (ref 36.0–46.0)
Hemoglobin: 6.6 g/dL — ABNORMAL LOW (ref 12.0–15.0)
MCH: 29.7 pg (ref 26.0–34.0)
MCHC: 32 g/dL (ref 30.0–36.0)
MCV: 92.8 fL (ref 80.0–100.0)
Platelets: 252 10*3/uL (ref 150–400)
RBC: 2.22 MIL/uL — ABNORMAL LOW (ref 3.87–5.11)
RDW: 14.5 % (ref 11.5–15.5)
WBC: 17.5 10*3/uL — ABNORMAL HIGH (ref 4.0–10.5)
nRBC: 0 % (ref 0.0–0.2)

## 2023-01-27 LAB — IRON AND TIBC
Iron: 17 ug/dL — ABNORMAL LOW (ref 28–170)
Saturation Ratios: 13 % (ref 10.4–31.8)
TIBC: 127 ug/dL — ABNORMAL LOW (ref 250–450)
UIBC: 110 ug/dL

## 2023-01-27 LAB — VITAMIN B12: Vitamin B-12: 722 pg/mL (ref 180–914)

## 2023-01-27 LAB — FOLATE: Folate: 10.1 ng/mL (ref >5.9)

## 2023-01-27 LAB — MAGNESIUM: Magnesium: 2 mg/dL (ref 1.7–2.4)

## 2023-01-27 LAB — TRANSFERRIN: Transferrin: 91 mg/dL — ABNORMAL LOW (ref 192–382)

## 2023-01-27 MED ORDER — CHLORHEXIDINE GLUCONATE CLOTH 2 % EX PADS
6.0000 | MEDICATED_PAD | Freq: Every day | CUTANEOUS | Status: DC
  Administered 2023-01-27 – 2023-02-04 (×9): 6 via TOPICAL

## 2023-01-27 MED ORDER — INSULIN GLARGINE-YFGN 100 UNIT/ML ~~LOC~~ SOLN
10.0000 [IU] | Freq: Every day | SUBCUTANEOUS | Status: DC
  Administered 2023-01-27 – 2023-01-28 (×2): 10 [IU] via SUBCUTANEOUS
  Filled 2023-01-27 (×2): qty 0.1

## 2023-01-27 MED ORDER — LACTULOSE 10 GM/15ML PO SOLN
20.0000 g | Freq: Once | ORAL | Status: AC
  Administered 2023-01-27: 20 g via ORAL
  Filled 2023-01-27: qty 30

## 2023-01-27 NOTE — Progress Notes (Signed)
  Peritoneal Dialysis Treatment disconnect Note        Consent signed and in chart.  PD treatment  disconnected via aseptic technique.    Patient is awake and alert. No complaints of pain.    PD exit site clean, dry and intact.   Extra drain on pt   Hand-off given to the patient's nurse.     Lynann Beaver LPN [facility]

## 2023-01-27 NOTE — TOC Progression Note (Signed)
Transition of Care [facility]) - Progression Note    Patient Details  Name: Heidi [name] MRN: [id number] Date of Birth: 1955/10/31  [facility]) CM/SW Contact  Heidi Fitch, RN Phone Number: 01/27/2023, 1:18 PM  Clinical Narrative:      Patient now with high risk score for readmission  Met with patient and son at bedside  Admitted for: Fluid Overload Admitted from: home with son PCP: Heidi [name] Current home health/prior home health/DME: [facility], [facility], [facility]W  Patient confirms O2 is acute Patient states that she does not feel a PT eval is indicated at this time       Expected Discharge Plan and Services                                               Social Determinants of Health (SDOH) Interventions SDOH Screenings   Food Insecurity: No Food Insecurity (01/24/2023)  Housing: Low Risk  (01/25/2023)  Transportation Needs: No Transportation Needs (01/24/2023)  Utilities: Not At Risk (01/24/2023)  Financial Resource Strain: Low Risk  (10/11/2022)   Received from [facility]  Physical Activity: Inactive (03/10/2021)   Received from [facility], [facility]  Stress: No Stress Concern Present (03/10/2021)   Received from [facility], [facility]  Tobacco Use: Low Risk  (01/25/2023)    Readmission Risk Interventions    01/27/2023    1:16 PM  Readmission Risk Prevention Plan  Transportation Screening Complete  Medication Review (RN CM) Complete

## 2023-01-27 NOTE — Care Management Important Message (Signed)
Important Message  Patient Details  Name: Heidi [name] MRN: [id number] Date of Birth: 1955/09/17   Important Message Given:  Yes - Medicare IM     [name] 01/27/2023, 10:09 AM

## 2023-01-27 NOTE — Progress Notes (Addendum)
[facility]  ROUNDING NOTE   Subjective:   Heidi [name] is a 67 y.o. female with past medical conditions including GERD, hypertension, dyslipidemia, asthma, osteoarthritis, anemia, and end-stage renal disease on peritoneal dialysis.  Patient presents to the emergency room complaining of shortness of breath and increased edema.  Patient has been admitted for Shortness of breath [R06.02] Fluid overload [E87.70] Symptomatic anemia [D64.9]  Patient is known to our practice and is followed by Dr Cherylann Ratel at [facility]   Patient seen sitting up in chair Currently being taken off PD treatment Continues to complain of generalized malaise States respiratory status mildly improved  UF 1.7 L   Objective:  Vital signs in last 24 hours:  Temp:  [98.1 F (36.7 C)-99.6 F (37.6 C)] 98.6 F (37 C) (12/20 0303) Pulse Rate:  [72-74] 74 (12/20 0303) Resp:  [16-20] 20 (12/20 0303) BP: (121-142)/(61-97) 142/61 (12/20 0303) SpO2:  [98 %-99 %] 98 % (12/20 0303) Weight:  [103 kg] 103 kg (12/20 0437)  Weight change: 0.4 kg Filed Weights   01/26/23 0356 01/26/23 0815 01/27/23 0437  Weight: 103.1 kg 100 kg 103 kg    Intake/Output: I/O last 3 completed shifts: In: -  Out: 1464 [Urine:275; Other:1189]   Intake/Output this shift:  Total I/O In: -  Out: 1704 [Other:1704]  Physical Exam: General: NAD  Head: Normocephalic, atraumatic. Moist oral mucosal membranes  Eyes: Anicteric  Lungs:  Mild crackles, [place] O2  Heart: Regular rate and rhythm  Abdomen:  Soft, nontender, PDC  Extremities:  2+ peripheral edema.  Neurologic: Alert, moving all four extremities  Skin: No lesions  Access: PD catheter    Basic Metabolic Panel: Recent Labs  Lab 01/23/23 1311 01/24/23 0445 01/25/23 0617 01/26/23 0454 01/27/23 0417  NA 132* 133* 132* 130* 133*  K 3.4* 3.6 4.0 3.8 3.7  CL 94* 94* 94* 92* 93*  CO2 25 23 24  21* 24  GLUCOSE 61* 114* 195* 418* 397*  BUN 69* 71* 71* 66* 62*   CREATININE 10.63* 10.91* 10.70* 11.19* 10.45*  CALCIUM 7.7* 7.3* 7.3* 7.5* 8.0*  MG  --   --  2.0 2.0 2.0    Liver Function Tests: Recent Labs  Lab 01/24/23 0445  AST 16  ALT 25  ALKPHOS 61  BILITOT 0.3  PROT 5.3*  ALBUMIN 1.9*   No results for input(s): "LIPASE", "AMYLASE" in the last 168 hours. No results for input(s): "AMMONIA" in the last 168 hours.  CBC: Recent Labs  Lab 01/23/23 1311 01/24/23 0445 01/25/23 0841 01/26/23 0454 01/27/23 0417  WBC 20.5* 16.4* 15.0* 14.8* 17.5*  NEUTROABS 18.1* 13.7*  --   --   --   HGB 7.1* 6.4* 6.0* 6.7* 6.6*  HCT 21.1* 19.8* 18.8* 20.1* 20.6*  MCV 87.9 92.1 94.0 89.7 92.8  PLT 319 301 261 244 252    Cardiac Enzymes: No results for input(s): "CKTOTAL", "CKMB", "CKMBINDEX", "TROPONINI" in the last 168 hours.  BNP: Invalid input(s): "POCBNP"  CBG: Recent Labs  Lab 01/25/23 0804 01/26/23 1555 01/26/23 2046 01/27/23 0804 01/27/23 1251  GLUCAP 184* 333* 327* 384* 312*    Microbiology: Results for orders placed or performed during the hospital encounter of 01/23/23  SARS Coronavirus 2 by RT PCR (hospital order, performed in [facility] hospital lab) *cepheid single result test* Anterior Nasal Swab     Status: None   Collection Time: 01/23/23  1:08 PM   Specimen: Anterior Nasal Swab  Result Value Ref Range Status  SARS Coronavirus 2 by RT PCR NEGATIVE NEGATIVE Final    Comment: (NOTE) SARS-CoV-2 target nucleic acids are NOT DETECTED.  The SARS-CoV-2 RNA is generally detectable in upper and lower respiratory specimens during the acute phase of infection. The lowest concentration of SARS-CoV-2 viral copies this assay can detect is 250 copies / mL. A negative result does not preclude SARS-CoV-2 infection and should not be used as the sole basis for treatment or other patient management decisions.  A negative result may occur with improper specimen collection / handling, submission of specimen other than nasopharyngeal  swab, presence of viral mutation(s) within the areas targeted by this assay, and inadequate number of viral copies (<250 copies / mL). A negative result must be combined with clinical observations, patient history, and epidemiological information.  Fact Sheet for Patients:   RoadLapTop.co.za  Fact Sheet for Healthcare Providers: http://kim-miller.com/  This test is not yet approved or  cleared by the [place] FDA and has been authorized for detection and/or diagnosis of SARS-CoV-2 by FDA under an Emergency Use Authorization (EUA).  This EUA will remain in effect (meaning this test can be used) for the duration of the COVID-19 declaration under Section 564(b)(1) of the Act, 21 U.S.C. section 360bbb-3(b)(1), unless the authorization is terminated or revoked sooner.  Performed at [facility], [address] [place], [place] [address]   Urine Culture (for pregnant, neutropenic or urologic patients or patients with an indwelling urinary catheter)     Status: Abnormal   Collection Time: 01/24/23  2:02 AM   Specimen: Urine, Catheterized  Result Value Ref Range Status   Specimen Description   Final    URINE, CATHETERIZED Performed at [facility], [address], [place], [place] [address]    Special Requests   Final    NONE Performed at [facility], [address], [place], [place] [address]    Culture (A)  Final    <10,000 COLONIES/mL INSIGNIFICANT GROWTH Performed at [facility], [address], [place], [place] [address]    Report Status 01/25/2023 FINAL  Final  Culture, blood (x 2)     Status: None (Preliminary result)   Collection Time: 01/24/23  5:10 AM   Specimen: BLOOD  Result Value Ref Range Status   Specimen Description BLOOD LEFT ASSIST CONTROL  Final   Special Requests   Final    BOTTLES DRAWN AEROBIC AND ANAEROBIC Blood Culture results may not be optimal due to an inadequate  volume of blood received in culture bottles   Culture   Final    NO GROWTH 3 DAYS Performed at [facility], [address] [place], [place] [address]    Report Status PENDING  Incomplete  Culture, blood (x 2)     Status: None (Preliminary result)   Collection Time: 01/24/23  5:10 AM   Specimen: BLOOD  Result Value Ref Range Status   Specimen Description BLOOD LEFT HAND  Final   Special Requests   Final    BOTTLES DRAWN AEROBIC AND ANAEROBIC Blood Culture results may not be optimal due to an inadequate volume of blood received in culture bottles   Culture   Final    NO GROWTH 3 DAYS Performed at [facility], [address] [place], [place] [address]    Report Status PENDING  Incomplete    Coagulation Studies: No results for input(s): "LABPROT", "INR" in the last 72 hours.   Urinalysis: No results for input(s): "COLORURINE", "LABSPEC", "PHURINE", "  GLUCOSEU", "HGBUR", "BILIRUBINUR", "KETONESUR", "PROTEINUR", "UROBILINOGEN", "NITRITE", "LEUKOCYTESUR" in the last 72 hours.  Invalid input(s): "APPERANCEUR"     Imaging: No results found.    Medications:    albumin human 25 g (01/27/23 1019)   dialysis solution 2.5% low-MG/low-CA     dialysis solution 4.25% low-MG/low-CA      calcitRIOL  0.25 mcg Oral Daily   carvedilol  12.5 mg Oral BID WC   Chlorhexidine Gluconate Cloth  6 each Topical Q0600   cloNIDine  0.1 mg Oral BID   vitamin B-12  1,000 mcg Oral Daily   feeding supplement (NEPRO CARB STEADY)  237 mL Oral BID BM   fluticasone  2 spray Each Nare Daily   folic acid  1 mg Oral Daily   furosemide  80 mg Intravenous TID   gentamicin cream  1 Application Topical Daily   heparin  5,000 Units Subcutaneous Q12H   insulin aspart  0-5 Units Subcutaneous QHS   insulin aspart  0-6 Units Subcutaneous TID WC   insulin glargine-yfgn  10 Units Subcutaneous Daily   losartan  100 mg Oral Daily   pravastatin  20 mg Oral q1800   senna-docusate  2 tablet Oral  Daily   sodium chloride flush  10 mL Intravenous Q12H   [START ON 01/28/2023] Vitamin D (Ergocalciferol)  50,000 Units Oral Q7 days   acetaminophen **OR** acetaminophen, albuterol, ondansetron, traZODone  Assessment/ Plan:  Heidi [name] is a 67 y.o.  female  with past medical conditions including GERD, hypertension, dyslipidemia, asthma, osteoarthritis, anemia, and end-stage renal disease on peritoneal dialysis.  Patient presents to the emergency room complaining of shortness of breath and increased edema.  Patient has been admitted for Shortness of breath [R06.02] Fluid overload [E87.70] Symptomatic anemia [D64.9]  [facility] PD  End stage renal disease on peritoneal dialysis. No missed outpatient treatments. Decreased urination may have lead to fluid overload, insufficient dialysis. Will dialyze patient tonight with a 4.25% and 2.5% dialysate to optimize fluid removal.   -Patient has had 2 CT scans, both with contrast, back in March and May.  We feel this may have greatly decreased her residual renal function.  Continue IV furosemide 80 mg 3 times daily.   - Patient received PD treatment overnight.  UF 1.7 L.  Will continue current treatments along with albumin.  Discussed with patient the possibility of needing backup hemodialysis to remove fluid if she becomes unstable.  Patient agreeable. -Patient reports small BM yesterday, will order another dose of lactulose today.  2. Anemia of chronic kidney disease  Lab Results  Component Value Date   HGB 6.6 (L) 01/27/2023    Hgb 6.6, 1 unit blood transfusion given on 01/25/23.  Patient does have current suspicion of renal carcinoma, will defer ESA's due to to this.  Will defer to primary team for need of blood transfusions.  3. Secondary Hyperparathyroidism: with outpatient labs: none  Lab Results  Component Value Date   CALCIUM 8.0 (L) 01/27/2023   CAION 1.26 10/07/2021  Will continue to monitor bone minerals.  Calcium currently  acceptable.  4.  Hypertension with chronic kidney disease.  Home regimen includes amlodipine, carvedilol, clonidine, hydralazine, losartan, and metoprolol.   Amlodipine, hydralazine, and metoprolol currently held.  Blood pressure stable for this patient, 142/61.   LOS: 3 Romy Mcgue 12/20/20241:26 PM

## 2023-01-27 NOTE — Progress Notes (Signed)
Progress Note   Patient: Heidi [name] JYN:[id number] DOB: 21-Dec-1955 DOA: 01/23/2023     3 DOS: the patient was seen and examined on 01/27/2023   Brief hospital course: Heidi [name] is a 67 y.o. female with medical history significant for asthma, osteoarthritis, GERD, hypertension, dyslipidemia, stage III CKD, who presented to the emergency room with acute onset of dyspnea with associated orthopnea and worsening lower extremity edema.    Assessment & Plan:  * Fluid overload  Acute pulmonary edema ESRD on peritoneal dialysis. Fluid overload likely due to decreased urination and/or insufficient PD at home. Echo shows EF 60%, grade 2 diastolic dysfunction. She still struggling with excess fluid, has trouble breathing. Continue IV lasix 80 mg TID, albumin per nephrology team. Continue to optimize fluid removal with PD, if unsuccessful she understands the need of emergent HD.   Acute on chronic anemia Folate def No active bleeding.  S/p 1u pRBC  HB today 6.6. Continue to monitor Hgb and transfuse to keep Hgb >6.5 Continue folic acid and vit B12 supplement   Sepsis, ruled out UTI, ruled out Urine Cultures show insignificant growth.   No antibiotics needed. WBC high noted. Likely reactive  Continue to trend.   Type 2 diabetes mellitus with chronic kidney disease (HCC) A1c 5.6 from 10/13/22, will check A1c add to prior labs. Blood sugars ~300, she is on Novolin N 10 units AM. Will start Semglee 10units daily, continue accucheks, sliding scale insulin. Hypoglycemia protocol.   Essential hypertension Continue coreg, clonidine, losartan Hold amlodipine and hydralazine since BP intermittently low   Dyslipidemia Continue statin therapy.    Continue constipation regimen, lactulose.  Out of bed to chair. Incentive spirometry. Nursing supportive care. Fall, aspiration precautions. DVT prophylaxis   Code Status: Full Code.  Subjective: Patient is seen and examined today  morning. She has mild short of breath with exertion. Has abdominal distention.  Bowel movement with lactulose yesterday.  Seen in chair, eating poor.  Physical Exam: Vitals:   01/26/23 2044 01/27/23 0303 01/27/23 0437 01/27/23 1628  BP: (!) 121/97 (!) 142/61  113/69  Pulse: 74 74  68  Resp: 20 20  16   Temp: 99.6 F (37.6 C) 98.6 F (37 C)  98.5 F (36.9 C)  TempSrc: Oral Oral  Oral  SpO2: 99% 98%  97%  Weight:   103 kg   Height:        General - Elderly African American obese female, mild respiratory distress. HEENT - PERRLA, EOMI, atraumatic head, non tender sinuses. Lung - Clear, diffuse rales, rhonchi, no wheezes. Heart - S1, S2 heard, no murmurs, rubs, 1+ pedal edema. Abdomen - Soft, non tender, distended, bowel sounds poor Neuro - Alert, awake and oriented x 3, non focal exam. Skin - Warm and dry.  Data Reviewed:      Latest Ref Rng & Units 01/27/2023    4:17 AM 01/26/2023    4:54 AM 01/25/2023    8:41 AM  CBC  WBC 4.0 - 10.5 K/uL 17.5  14.8  15.0   Hemoglobin 12.0 - 15.0 g/dL 6.6  6.7  6.0   Hematocrit 36.0 - 46.0 % 20.6  20.1  18.8   Platelets 150 - 400 K/uL 252  244  261       Latest Ref Rng & Units 01/27/2023    4:17 AM 01/26/2023    4:54 AM 01/25/2023    6:17 AM  BMP  Glucose 70 - 99 mg/dL 865  784  696  BUN 8 - 23 mg/dL 62  66  71   Creatinine 0.44 - 1.00 mg/dL 62.95  28.41  32.44   Sodium 135 - 145 mmol/L 133  130  132   Potassium 3.5 - 5.1 mmol/L 3.7  3.8  4.0   Chloride 98 - 111 mmol/L 93  92  94   CO2 22 - 32 mmol/L 24  21  24    Calcium 8.9 - 10.3 mg/dL 8.0  7.5  7.3    No results found.    Family Communication: Discussed with patient, she understand and agree. All questions answereed.  Disposition: Status is: Inpatient Remains inpatient appropriate because: volume management, constipation, PT eval  Planned Discharge Destination: Home with Home Health     Time spent: 37 minutes  Author: Marcelino Duster, MD 01/27/2023 4:52  PM Secure chat 7am to 7pm For on call review [email or web address].

## 2023-01-27 NOTE — Progress Notes (Signed)
Peritoneal Dialysis Treatment Initiation Note   Pre Treatment Weight: 97 kg  Consent signed and in chart.  PD treatment initiated via aseptic technique.   Patient is awake and alert. No complaints of pain.   PD exit site clean, dry and intact.  Gentamicin and new dressing applied.   Hand-off given to the patient's nurse.  Education provided to dept staff  regarding PD machine and how  to contact tech support if machine  alarms.    Ralene Muskrat RN [facility]

## 2023-01-27 NOTE — Inpatient Diabetes Management (Signed)
Inpatient Diabetes Program Recommendations  AACE/ADA: New Consensus Statement on Inpatient Glycemic Control   Target Ranges:  Prepandial:   less than 140 mg/dL      Peak postprandial:   less than 180 mg/dL (1-2 hours)      Critically ill patients:  140 - 180 mg/dL     Review of Glycemic Control  Latest Reference Range & Units 01/25/23 08:04 01/26/23 15:55 01/26/23 20:46 01/27/23 08:04  Glucose-Capillary 70 - 99 mg/dL 604 (H) 540 (H) 981 (H) 384 (H)   Diabetes history: DM2 Outpatient Diabetes medications: 70/30 10 units QAM Current orders for Inpatient glycemic control: Novolog 0-6 units + hs  Nepro bid between meals Peritoneal dialysis  Inpatient Diabetes Program Recommendations:     Insulin: Add Semglee 5 units Q24 hours  Thanks, Christena Deem RN, MSN, BC-ADM Inpatient Diabetes Coordinator Team Pager [phone] (8a-5p)

## 2023-01-28 DIAGNOSIS — E8779 Other fluid overload: Secondary | ICD-10-CM | POA: Diagnosis not present

## 2023-01-28 DIAGNOSIS — E1122 Type 2 diabetes mellitus with diabetic chronic kidney disease: Secondary | ICD-10-CM | POA: Diagnosis not present

## 2023-01-28 DIAGNOSIS — I10 Essential (primary) hypertension: Secondary | ICD-10-CM | POA: Diagnosis not present

## 2023-01-28 DIAGNOSIS — D649 Anemia, unspecified: Secondary | ICD-10-CM | POA: Diagnosis not present

## 2023-01-28 LAB — BPAM RBC
Blood Product Expiration Date: 202501222359
Blood Product Expiration Date: 202501242359
ISSUE DATE / TIME: 202412180846
Unit Type and Rh: 5100
Unit Type and Rh: 5100

## 2023-01-28 LAB — TYPE AND SCREEN
ABO/RH(D): O POS
Antibody Screen: POSITIVE
DAT, IgG: NEGATIVE
DAT, complement: NEGATIVE
Unit division: 0
Unit division: 0

## 2023-01-28 LAB — CBC
HCT: 18.9 % — ABNORMAL LOW (ref 36.0–46.0)
Hemoglobin: 6.2 g/dL — ABNORMAL LOW (ref 12.0–15.0)
MCH: 30.1 pg (ref 26.0–34.0)
MCHC: 32.8 g/dL (ref 30.0–36.0)
MCV: 91.7 fL (ref 80.0–100.0)
Platelets: 266 10*3/uL (ref 150–400)
RBC: 2.06 MIL/uL — ABNORMAL LOW (ref 3.87–5.11)
RDW: 14.5 % (ref 11.5–15.5)
WBC: 15.9 10*3/uL — ABNORMAL HIGH (ref 4.0–10.5)
nRBC: 0 % (ref 0.0–0.2)

## 2023-01-28 LAB — GLUCOSE, CAPILLARY
Glucose-Capillary: 329 mg/dL — ABNORMAL HIGH (ref 70–99)
Glucose-Capillary: 396 mg/dL — ABNORMAL HIGH (ref 70–99)
Glucose-Capillary: 304 mg/dL — ABNORMAL HIGH (ref 70–99)
Glucose-Capillary: 227 mg/dL — ABNORMAL HIGH (ref 70–99)

## 2023-01-28 LAB — BASIC METABOLIC PANEL
Anion gap: 15 (ref 5–15)
BUN: 54 mg/dL — ABNORMAL HIGH (ref 8–23)
CO2: 25 mmol/L (ref 22–32)
Calcium: 8.4 mg/dL — ABNORMAL LOW (ref 8.9–10.3)
Chloride: 94 mmol/L — ABNORMAL LOW (ref 98–111)
Creatinine, Ser: 9.93 mg/dL — ABNORMAL HIGH (ref 0.44–1.00)
GFR, Estimated: 4 mL/min — ABNORMAL LOW (ref >60)
Glucose, Bld: 362 mg/dL — ABNORMAL HIGH (ref 70–99)
Potassium: 3.6 mmol/L (ref 3.5–5.1)
Sodium: 134 mmol/L — ABNORMAL LOW (ref 135–145)

## 2023-01-28 LAB — MAGNESIUM: Magnesium: 2.1 mg/dL (ref 1.7–2.4)

## 2023-01-28 LAB — HEMOGLOBIN A1C
Hgb A1c MFr Bld: 6.1 % — ABNORMAL HIGH (ref 4.8–5.6)
Mean Plasma Glucose: 128 mg/dL

## 2023-01-28 MED ORDER — ALUM & MAG HYDROXIDE-SIMETH 200-200-20 MG/5ML PO SUSP
30.0000 mL | ORAL | Status: DC | PRN
  Filled 2023-01-28: qty 30

## 2023-01-28 MED ORDER — INSULIN ASPART 100 UNIT/ML IJ SOLN
10.0000 [IU] | Freq: Three times a day (TID) | INTRAMUSCULAR | Status: DC
  Administered 2023-01-28 – 2023-01-29 (×3): 10 [IU] via SUBCUTANEOUS
  Filled 2023-01-28 (×4): qty 1

## 2023-01-28 MED ORDER — INSULIN GLARGINE-YFGN 100 UNIT/ML ~~LOC~~ SOLN
15.0000 [IU] | Freq: Every day | SUBCUTANEOUS | Status: DC
  Administered 2023-01-29 – 2023-02-04 (×7): 15 [IU] via SUBCUTANEOUS
  Filled 2023-01-28 (×7): qty 0.15

## 2023-01-28 NOTE — Progress Notes (Signed)
Pt. PD fluid Manually drained from PD catheter. 1500 mls removed. PD catheter capped with aseptic technique. Reported to nurse Kem Boroughs, RN.

## 2023-01-28 NOTE — Progress Notes (Signed)
Peritoneal Dialysis Treatment Disconnect Note   Post-tx wt: 97.7 kg Consent signed and in chart.   Gentamicin applied and new dressing applied.  Hand-off given to the pt's nurse. Education provided to dept staff Regarding PD machine and how to  Contact tech support if machine alarms. .  PD treatment disconnected via aseptic technique.     PD exit site clean, dry and intact.       Maple Hudson, RN [facility]

## 2023-01-28 NOTE — Progress Notes (Signed)
Progress Note   Patient: Heidi [name] ZDG:[id number] DOB: 12-13-1955 DOA: 01/23/2023     4 DOS: the patient was seen and examined on 01/28/2023   Brief hospital course: BERENIS CENAC is a 67 y.o. female with medical history significant for asthma, osteoarthritis, GERD, hypertension, dyslipidemia, stage III CKD, who presented to the emergency room with acute onset of dyspnea with associated orthopnea and worsening lower extremity edema.   She Is admitted for fluid overload due to insufficient PD at home. She had PD while inpatient with no improvement of her symptoms. Nephrology plan to initiate HD next week. She is weak and short of breath, has constipation. Her Hb is dropping, possibly due to hemodilution and ESRD. Her sugars are very high.    Assessment & Plan:  * Fluid overload  Acute pulmonary edema ESRD on peritoneal dialysis. Fluid overload likely due to decreased urination and/or insufficient PD at home. Echo shows EF 60%, grade 2 diastolic dysfunction. She has trouble breathing even with rest. Continue IV lasix 80 mg TID, albumin per nephrology team. Continue to optimize fluid removal with PD, possibly need HD next week.   Acute on chronic anemia Folate def No active bleeding.  S/p 1u pRBC  HB today 6.2. possibly hemodilution due to fluid overload. Per nephro can't get erythropoietin due to suspicion of RCC. Continue to monitor Hgb and transfuse to keep Hgb >6 Continue folic acid and vit B12 supplement. Check stool occult.   Sepsis, ruled out UTI, ruled out Urine Cultures show insignificant growth.   No antibiotics needed. WBC 15.9 possibly reactive  Continue to trend.    Type 2 diabetes mellitus with chronic kidney disease (HCC) A1c 6.1 but blood sugars very high. Low A1c due to severe anemia. Blood sugars ~300, start Aspart 10 units TID. Increase Semglee to 15units daily, continue accucheks, sliding scale insulin. Hypoglycemia protocol.   Essential  hypertension Continue coreg, clonidine, losartan Amlodipine and hydralazine on hold.   Dyslipidemia Continue statin therapy.    Continue constipation regimen, lactulose.  Out of bed to chair. Incentive spirometry. Nursing supportive care. Fall, aspiration precautions. DVT prophylaxis   Code Status: Full Code.  Subjective: Patient is seen and examined today morning. She has short of breath even resting. Has abdominal distention.  Has bowel movement with today.  Eating fair.  Physical Exam: Vitals:   01/28/23 0410 01/28/23 0414 01/28/23 0819 01/28/23 1958  BP: 128/64  121/72 110/60  Pulse: 76  66 73  Resp: 16  14 16   Temp: 98.4 F (36.9 C)  99.5 F (37.5 C) 98.4 F (36.9 C)  TempSrc: Oral  Oral Oral  SpO2: 98%  100% 100%  Weight:  101 kg    Height:        General - Elderly African American obese female, mild respiratory distress. HEENT - PERRLA, EOMI, atraumatic head, non tender sinuses. Lung - Clear, basal rales,diffuse rhonchi, no wheezes. Heart - S1, S2 heard, no murmurs, rubs, 1+ pedal edema. Abdomen - Soft, non tender, distended, bowel sounds poor Neuro - Alert, awake and oriented x 3, non focal exam. Skin - Warm and dry.  Data Reviewed:      Latest Ref Rng & Units 01/28/2023    5:31 AM 01/27/2023    4:17 AM 01/26/2023    4:54 AM  CBC  WBC 4.0 - 10.5 K/uL 15.9  17.5  14.8   Hemoglobin 12.0 - 15.0 g/dL 6.2  6.6  6.7   Hematocrit 36.0 - 46.0 % 18.9  20.6  20.1   Platelets 150 - 400 K/uL 266  252  244       Latest Ref Rng & Units 01/28/2023    5:31 AM 01/27/2023    4:17 AM 01/26/2023    4:54 AM  BMP  Glucose 70 - 99 mg/dL 366  440  347   BUN 8 - 23 mg/dL 54  62  66   Creatinine 0.44 - 1.00 mg/dL 4.25  95.63  87.56   Sodium 135 - 145 mmol/L 134  133  130   Potassium 3.5 - 5.1 mmol/L 3.6  3.7  3.8   Chloride 98 - 111 mmol/L 94  93  92   CO2 22 - 32 mmol/L 25  24  21    Calcium 8.9 - 10.3 mg/dL 8.4  8.0  7.5    No results found.  Family  Communication: Discussed with patient, she understand and agree. All questions answereed.  Disposition: Status is: Inpatient Remains inpatient appropriate because: volume management, may need HD, PT follow up  Planned Discharge Destination: Home with Home Health     Time spent: 36 minutes  Author: Marcelino Duster, MD 01/28/2023 8:27 PM Secure chat 7am to 7pm For on call review [email or web address].

## 2023-01-28 NOTE — Plan of Care (Signed)
Continued to titrate insulin this shift.   Problem: Fluid Volume: Goal: Hemodynamic stability will improve Outcome: Progressing   Problem: Clinical Measurements: Goal: Diagnostic test results will improve Outcome: Progressing Goal: Signs and symptoms of infection will decrease Outcome: Progressing   Problem: Respiratory: Goal: Ability to maintain adequate ventilation will improve Outcome: Progressing   Problem: Education: Goal: Knowledge of General Education information will improve Description: Including pain rating scale, medication(s)/side effects and non-pharmacologic comfort measures Outcome: Progressing   Problem: Health Behavior/Discharge Planning: Goal: Ability to manage health-related needs will improve Outcome: Progressing   Problem: Clinical Measurements: Goal: Ability to maintain clinical measurements within normal limits will improve Outcome: Progressing Goal: Will remain free from infection Outcome: Progressing Goal: Diagnostic test results will improve Outcome: Progressing Goal: Respiratory complications will improve Outcome: Progressing Goal: Cardiovascular complication will be avoided Outcome: Progressing   Problem: Activity: Goal: Risk for activity intolerance will decrease Outcome: Progressing   Problem: Nutrition: Goal: Adequate nutrition will be maintained Outcome: Progressing   Problem: Coping: Goal: Level of anxiety will decrease Outcome: Progressing   Problem: Elimination: Goal: Will not experience complications related to bowel motility Outcome: Progressing Goal: Will not experience complications related to urinary retention Outcome: Progressing   Problem: Pain Management: Goal: General experience of comfort will improve Outcome: Progressing   Problem: Safety: Goal: Ability to remain free from injury will improve Outcome: Progressing   Problem: Skin Integrity: Goal: Risk for impaired skin integrity will decrease Outcome:  Progressing   Problem: Education: Goal: Ability to describe self-care measures that may prevent or decrease complications (Diabetes Survival Skills Education) will improve Outcome: Progressing Goal: Individualized Educational Video(s) Outcome: Progressing   Problem: Coping: Goal: Ability to adjust to condition or change in health will improve Outcome: Progressing   Problem: Fluid Volume: Goal: Ability to maintain a balanced intake and output will improve Outcome: Progressing   Problem: Health Behavior/Discharge Planning: Goal: Ability to identify and utilize available resources and services will improve Outcome: Progressing Goal: Ability to manage health-related needs will improve Outcome: Progressing   Problem: Metabolic: Goal: Ability to maintain appropriate glucose levels will improve Outcome: Progressing   Problem: Nutritional: Goal: Maintenance of adequate nutrition will improve Outcome: Progressing Goal: Progress toward achieving an optimal weight will improve Outcome: Progressing   Problem: Skin Integrity: Goal: Risk for impaired skin integrity will decrease Outcome: Progressing   Problem: Tissue Perfusion: Goal: Adequacy of tissue perfusion will improve Outcome: Progressing

## 2023-01-28 NOTE — Progress Notes (Signed)
[facility]  ROUNDING NOTE   Subjective:   Heidi [name] is a 67 y.o. female with past medical conditions including GERD, hypertension, dyslipidemia, asthma, osteoarthritis, anemia, and end-stage renal disease on peritoneal dialysis.  Patient presents to the emergency room complaining of shortness of breath and increased edema.  Patient has been admitted for Shortness of breath [R06.02] Fluid overload [E87.70] Symptomatic anemia [D64.9]  Patient is known to our practice and is followed by Dr Cherylann Ratel at [facility].   Patient seen sitting at side of bed Alert Eating breakfast Continues to require 2L  UF 1.5L, awaiting UF total from last fill.    Objective:  Vital signs in last 24 hours:  Temp:  [98.4 F (36.9 C)-99.5 F (37.5 C)] 99.5 F (37.5 C) (12/21 0819) Pulse Rate:  [66-76] 66 (12/21 0819) Resp:  [14-16] 14 (12/21 0819) BP: (101-128)/(49-72) 121/72 (12/21 0819) SpO2:  [97 %-100 %] 100 % (12/21 0819) Weight:  [97 kg-101 kg] 101 kg (12/21 0414)  Weight change: -3 kg Filed Weights   01/27/23 0437 01/27/23 2054 01/28/23 0414  Weight: 103 kg 97 kg 101 kg    Intake/Output: I/O last 3 completed shifts: In: 637.1 [P.O.:240; IV Piggyback:397.1] Out: 1704 [Other:1704]   Intake/Output this shift:  Total I/O In: -  Out: 1547 [Other:1547]  Physical Exam: General: NAD  Head: Normocephalic, atraumatic. Moist oral mucosal membranes  Eyes: Anicteric  Lungs:  Mild crackles, Tees Toh O2  Heart: Regular rate and rhythm  Abdomen:  Soft, nontender, PDC  Extremities:  2+ peripheral edema.  Neurologic: Alert, moving all four extremities  Skin: No lesions  Access: PD catheter    Basic Metabolic Panel: Recent Labs  Lab 01/24/23 0445 01/25/23 0617 01/26/23 0454 01/27/23 0417 01/28/23 0531  NA 133* 132* 130* 133* 134*  K 3.6 4.0 3.8 3.7 3.6  CL 94* 94* 92* 93* 94*  CO2 23 24 21* 24 25  GLUCOSE 114* 195* 418* 397* 362*  BUN 71* 71* 66* 62* 54*   CREATININE 10.91* 10.70* 11.19* 10.45* 9.93*  CALCIUM 7.3* 7.3* 7.5* 8.0* 8.4*  MG  --  2.0 2.0 2.0 2.1    Liver Function Tests: Recent Labs  Lab 01/24/23 0445  AST 16  ALT 25  ALKPHOS 61  BILITOT 0.3  PROT 5.3*  ALBUMIN 1.9*   No results for input(s): "LIPASE", "AMYLASE" in the last 168 hours. No results for input(s): "AMMONIA" in the last 168 hours.  CBC: Recent Labs  Lab 01/23/23 1311 01/24/23 0445 01/25/23 0841 01/26/23 0454 01/27/23 0417 01/28/23 0531  WBC 20.5* 16.4* 15.0* 14.8* 17.5* 15.9*  NEUTROABS 18.1* 13.7*  --   --   --   --   HGB 7.1* 6.4* 6.0* 6.7* 6.6* 6.2*  HCT 21.1* 19.8* 18.8* 20.1* 20.6* 18.9*  MCV 87.9 92.1 94.0 89.7 92.8 91.7  PLT 319 301 261 244 252 266    Cardiac Enzymes: No results for input(s): "CKTOTAL", "CKMB", "CKMBINDEX", "TROPONINI" in the last 168 hours.  BNP: Invalid input(s): "POCBNP"  CBG: Recent Labs  Lab 01/27/23 0804 01/27/23 1251 01/27/23 1631 01/27/23 1947 01/28/23 0815  GLUCAP 384* 312* 164* 273* 329*    Microbiology: Results for orders placed or performed during the hospital encounter of 01/23/23  SARS Coronavirus 2 by RT PCR (hospital order, performed in [facility] hospital lab) *cepheid single result test* Anterior Nasal Swab     Status: None   Collection Time: 01/23/23  1:08 PM   Specimen: Anterior Nasal Swab  Result Value Ref Range Status   SARS Coronavirus 2 by RT PCR NEGATIVE NEGATIVE Final    Comment: (NOTE) SARS-CoV-2 target nucleic acids are NOT DETECTED.  The SARS-CoV-2 RNA is generally detectable in upper and lower respiratory specimens during the acute phase of infection. The lowest concentration of SARS-CoV-2 viral copies this assay can detect is 250 copies / mL. A negative result does not preclude SARS-CoV-2 infection and should not be used as the sole basis for treatment or other patient management decisions.  A negative result may occur with improper specimen collection / handling,  submission of specimen other than nasopharyngeal swab, presence of viral mutation(s) within the areas targeted by this assay, and inadequate number of viral copies (<250 copies / mL). A negative result must be combined with clinical observations, patient history, and epidemiological information.  Fact Sheet for Patients:   RoadLapTop.co.za  Fact Sheet for Healthcare Providers: http://kim-miller.com/  This test is not yet approved or  cleared by the [place] FDA and has been authorized for detection and/or diagnosis of SARS-CoV-2 by FDA under an Emergency Use Authorization (EUA).  This EUA will remain in effect (meaning this test can be used) for the duration of the COVID-19 declaration under Section 564(b)(1) of the Act, 21 U.S.C. section 360bbb-3(b)(1), unless the authorization is terminated or revoked sooner.  Performed at [facility], [address] [place], [place] [address]   Urine Culture (for pregnant, neutropenic or urologic patients or patients with an indwelling urinary catheter)     Status: Abnormal   Collection Time: 01/24/23  2:02 AM   Specimen: Urine, Catheterized  Result Value Ref Range Status   Specimen Description   Final    URINE, CATHETERIZED Performed at [facility], [address], [place], [place] [address]    Special Requests   Final    NONE Performed at [facility], [address], [place], [place] [address]    Culture (A)  Final    <10,000 COLONIES/mL INSIGNIFICANT GROWTH Performed at [facility], [address] [place], [place] [address]    Report Status 01/25/2023 FINAL  Final  Culture, blood (x 2)     Status: None (Preliminary result)   Collection Time: 01/24/23  5:10 AM   Specimen: BLOOD  Result Value Ref Range Status   Specimen Description BLOOD LEFT ASSIST CONTROL  Final   Special Requests   Final    BOTTLES DRAWN AEROBIC AND ANAEROBIC Blood Culture  results may not be optimal due to an inadequate volume of blood received in culture bottles   Culture   Final    NO GROWTH 4 DAYS Performed at [facility], [address], [place], [place] [address]    Report Status PENDING  Incomplete  Culture, blood (x 2)     Status: None (Preliminary result)   Collection Time: 01/24/23  5:10 AM   Specimen: BLOOD  Result Value Ref Range Status   Specimen Description BLOOD LEFT HAND  Final   Special Requests   Final    BOTTLES DRAWN AEROBIC AND ANAEROBIC Blood Culture results may not be optimal due to an inadequate volume of blood received in culture bottles   Culture   Final    NO GROWTH 4 DAYS Performed at [facility], [address], [place], [place] [address]    Report Status PENDING  Incomplete    Coagulation Studies: No results for input(s): "LABPROT", "INR" in the last 72 hours.   Urinalysis:  No results for input(s): "COLORURINE", "LABSPEC", "PHURINE", "GLUCOSEU", "HGBUR", "BILIRUBINUR", "KETONESUR", "PROTEINUR", "UROBILINOGEN", "NITRITE", "LEUKOCYTESUR" in the last 72 hours.  Invalid input(s): "APPERANCEUR"     Imaging: No results found.    Medications:    albumin human 25 g (01/28/23 0921)   dialysis solution 2.5% low-MG/low-CA     dialysis solution 4.25% low-MG/low-CA      calcitRIOL  0.25 mcg Oral Daily   carvedilol  12.5 mg Oral BID WC   Chlorhexidine Gluconate Cloth  6 each Topical Q0600   cloNIDine  0.1 mg Oral BID   vitamin B-12  1,000 mcg Oral Daily   feeding supplement (NEPRO CARB STEADY)  237 mL Oral BID BM   fluticasone  2 spray Each Nare Daily   folic acid  1 mg Oral Daily   furosemide  80 mg Intravenous TID   gentamicin cream  1 Application Topical Daily   heparin  5,000 Units Subcutaneous Q12H   insulin aspart  0-5 Units Subcutaneous QHS   insulin aspart  0-6 Units Subcutaneous TID WC   insulin glargine-yfgn  10 Units Subcutaneous Daily   losartan  100 mg Oral Daily   pravastatin   20 mg Oral q1800   senna-docusate  2 tablet Oral Daily   sodium chloride flush  10 mL Intravenous Q12H   Vitamin D (Ergocalciferol)  50,000 Units Oral Q7 days   acetaminophen **OR** acetaminophen, albuterol, ondansetron, traZODone  Assessment/ Plan:  Ms. ROXIE BERGNER is a 67 y.o.  female  with past medical conditions including GERD, hypertension, dyslipidemia, asthma, osteoarthritis, anemia, and end-stage renal disease on peritoneal dialysis.  Patient presents to the emergency room complaining of shortness of breath and increased edema.  Patient has been admitted for Shortness of breath [R06.02] Fluid overload [E87.70] Symptomatic anemia [D64.9]  [facility] PD  End stage renal disease on peritoneal dialysis. No missed outpatient treatments. Decreased urination may have lead to fluid overload, insufficient dialysis. Will dialyze patient tonight with a 4.25% and 2.5% dialysate to optimize fluid removal.   -Patient has had 2 CT scans, both with contrast, back in March and May.  We feel this may have greatly decreased her residual renal function.  Continue IV furosemide 80 mg 3 times daily.   - While discussing with patient and provider, fluid management has been a concern for months outpatient. At this time, we feel patient will benefit from hemodialysis for fluid removal. Patient is agreeable to this.  - Will consult  vascular surgery for permcath placement early next week.  - Patient will need outpatient dialysis clinic. Will notify renal navigator next week.  -Will continue PD this weekend at current order  2. Anemia of chronic kidney disease  Lab Results  Component Value Date   HGB 6.2 (L) 01/28/2023    Hgb 6.2, 1 unit blood transfusion given on 01/25/23.  Patient does have current suspicion of renal carcinoma, will defer ESA's due to to this.  Will defer to primary team for need of blood transfusions.  3. Secondary Hyperparathyroidism: with outpatient labs: none  Lab Results  Component  Value Date   CALCIUM 8.4 (L) 01/28/2023   CAION 1.26 10/07/2021  Will continue to monitor bone minerals during this admission.  4.  Hypertension with chronic kidney disease.  Home regimen includes amlodipine, carvedilol, clonidine, hydralazine, losartan, and metoprolol.   Amlodipine, hydralazine, and metoprolol currently held.  Blood pressure 121/72, stable   LOS: 4 Simrat Kendrick 12/21/202410:37 AM

## 2023-01-28 NOTE — Progress Notes (Signed)
Peritoneal Dialysis Treatment Initiation Note  Pre TX VS:see table below  Pre TX weight:99.1kg  Consent signed and in chart. PD treatment initiated via aseptic technique.  Patient is alert and oriented. No complaints of pain.   No specimen collected. PD exit site clean, dry and intact. Gentamycin and new dressing applied.   Hand-off given to the patient's nurse.  Bedside RN educated on PD machine and how to contact tech support when PD machine alarms.    01/28/23 2002  Vitals  Temp 98.5 F (36.9 C)  Temp Source Oral  BP 109/64  MAP (mmHg) 79  BP Location Left Arm  BP Method Automatic  Patient Position (if appropriate) Lying  Pulse Rate 75  Pulse Rate Source Monitor  Resp 18  Level of Consciousness  Level of Consciousness Alert  MEWS COLOR  MEWS Score Color Green  Oxygen Therapy  SpO2 100 %  O2 Device Nasal Cannula  O2 Flow Rate (L/min) 2 L/min  Height and Weight  Weight 99.1 kg  Type of Scale Used Bed  BMI (Calculated) 37.48  MEWS Score  MEWS Temp 0  MEWS Systolic 0  MEWS Pulse 0  MEWS RR 0  MEWS LOC 0  MEWS Score 0     Hulen Shouts RN [facility]

## 2023-01-29 DIAGNOSIS — E8779 Other fluid overload: Secondary | ICD-10-CM | POA: Diagnosis not present

## 2023-01-29 DIAGNOSIS — D649 Anemia, unspecified: Secondary | ICD-10-CM | POA: Diagnosis not present

## 2023-01-29 DIAGNOSIS — E1122 Type 2 diabetes mellitus with diabetic chronic kidney disease: Secondary | ICD-10-CM | POA: Diagnosis not present

## 2023-01-29 DIAGNOSIS — I10 Essential (primary) hypertension: Secondary | ICD-10-CM | POA: Diagnosis not present

## 2023-01-29 LAB — CBC
HCT: 18.1 % — ABNORMAL LOW (ref 36.0–46.0)
HCT: 22.3 % — ABNORMAL LOW (ref 36.0–46.0)
Hemoglobin: 5.8 g/dL — ABNORMAL LOW (ref 12.0–15.0)
Hemoglobin: 7 g/dL — ABNORMAL LOW (ref 12.0–15.0)
MCH: 29.7 pg (ref 26.0–34.0)
MCH: 29 pg (ref 26.0–34.0)
MCHC: 32 g/dL (ref 30.0–36.0)
MCHC: 31.4 g/dL (ref 30.0–36.0)
MCV: 92.8 fL (ref 80.0–100.0)
MCV: 92.5 fL (ref 80.0–100.0)
Platelets: 283 10*3/uL (ref 150–400)
Platelets: 275 10*3/uL (ref 150–400)
RBC: 1.95 MIL/uL — ABNORMAL LOW (ref 3.87–5.11)
RBC: 2.41 MIL/uL — ABNORMAL LOW (ref 3.87–5.11)
RDW: 14.6 % (ref 11.5–15.5)
RDW: 14.9 % (ref 11.5–15.5)
WBC: 14.3 10*3/uL — ABNORMAL HIGH (ref 4.0–10.5)
WBC: 15.4 10*3/uL — ABNORMAL HIGH (ref 4.0–10.5)
nRBC: 0 % (ref 0.0–0.2)
nRBC: 0 % (ref 0.0–0.2)

## 2023-01-29 LAB — GLUCOSE, CAPILLARY
Glucose-Capillary: 371 mg/dL — ABNORMAL HIGH (ref 70–99)
Glucose-Capillary: 356 mg/dL — ABNORMAL HIGH (ref 70–99)
Glucose-Capillary: 225 mg/dL — ABNORMAL HIGH (ref 70–99)
Glucose-Capillary: 243 mg/dL — ABNORMAL HIGH (ref 70–99)

## 2023-01-29 LAB — CULTURE, BLOOD (ROUTINE X 2)
Culture: NO GROWTH
Culture: NO GROWTH

## 2023-01-29 LAB — BASIC METABOLIC PANEL
Anion gap: 14 (ref 5–15)
BUN: 57 mg/dL — ABNORMAL HIGH (ref 8–23)
CO2: 25 mmol/L (ref 22–32)
Calcium: 8.5 mg/dL — ABNORMAL LOW (ref 8.9–10.3)
Chloride: 93 mmol/L — ABNORMAL LOW (ref 98–111)
Creatinine, Ser: 10.26 mg/dL — ABNORMAL HIGH (ref 0.44–1.00)
GFR, Estimated: 4 mL/min — ABNORMAL LOW (ref >60)
Glucose, Bld: 335 mg/dL — ABNORMAL HIGH (ref 70–99)
Potassium: 3.5 mmol/L (ref 3.5–5.1)
Sodium: 132 mmol/L — ABNORMAL LOW (ref 135–145)

## 2023-01-29 LAB — TYPE AND SCREEN

## 2023-01-29 LAB — MAGNESIUM: Magnesium: 2.2 mg/dL (ref 1.7–2.4)

## 2023-01-29 LAB — PREPARE RBC (CROSSMATCH)

## 2023-01-29 MED ORDER — SODIUM CHLORIDE 0.9% IV SOLUTION: Freq: Once | INTRAVENOUS | Status: AC

## 2023-01-29 MED ORDER — INSULIN ASPART 100 UNIT/ML IJ SOLN
15.0000 [IU] | Freq: Three times a day (TID) | INTRAMUSCULAR | Status: DC
Start: 2023-01-29 — End: 2023-02-01
  Administered 2023-01-29 – 2023-02-01 (×4): 15 [IU] via SUBCUTANEOUS
  Filled 2023-01-29 (×4): qty 1

## 2023-01-29 NOTE — Progress Notes (Addendum)
Progress Note   Patient: Heidi [name]:[id number] DOB: 01-Sep-1955 DOA: 01/23/2023     5 DOS: the patient was seen and examined on 01/29/2023   Brief hospital course: Heidi [name] is a 67 y.o. female with medical history significant for asthma, osteoarthritis, GERD, hypertension, dyslipidemia, stage III CKD, who presented to the emergency room with acute onset of dyspnea with associated orthopnea and worsening lower extremity edema.   She Is admitted for fluid overload due to insufficient PD at home. She had PD while inpatient with no improvement of her symptoms. Nephrology plan to initiate HD next week. She is weak and short of breath, has constipation. Her Hb is dropping, possibly due to hemodilution and ESRD. Her sugars are very high. Insulin regimen adjusted. She is pending HD access tomorrow.   Assessment & Plan:  * Fluid overload  Acute pulmonary edema ESRD on peritoneal dialysis. Fluid overload likely due to decreased urination and/or insufficient PD at home. Echo shows EF 60%, grade 2 diastolic dysfunction. She has trouble breathing even with rest. Continue IV lasix 80 mg TID, albumin per nephrology team. Continue to optimize fluid removal with PD, plan to transition to HD next week. Seen by vascular - NPO past midnight for dialysis catheter placement.   Acute on chronic anemia Folate def No active bleeding.  S/p 1u pRBC  HB today 5.8. another unit of transfusion ordered 12/22. Per nephro can't get erythropoietin due to suspicion of RCC. Continue to monitor Hgb and transfuse to keep Hgb >6 Continue folic acid and vit B12 supplement. Check stool occult.   Sepsis, ruled out UTI, ruled out Urine Cultures show insignificant growth.   No antibiotics needed. WBC 14.3, possibly reactive Continue to trend.    Type 2 diabetes mellitus with chronic kidney disease (HCC) A1c 6.1 but blood sugars very high. Low A1c due to severe anemia. Blood sugars ~300, increase Aspart to  15 units TID. Continue Semglee to 15units daily, continue accucheks, sliding scale insulin. Hypoglycemia protocol.   Essential hypertension Continue coreg, clonidine, losartan Amlodipine and hydralazine on hold.   Dyslipidemia Continue statin therapy.    Continue constipation regimen, lactulose.  Out of bed to chair. Incentive spirometry. Nursing supportive care. Fall, aspiration precautions. DVT prophylaxis   Code Status: Full Code.  Subjective: Patient is seen and examined today morning. She is sitting in chair, feels weak, has short of breath even resting. Abdominal distention persists. No constipation.  Eating fair. Sugars remain high.  Physical Exam: Vitals:   01/29/23 0755 01/29/23 1200 01/29/23 1259 01/29/23 1315  BP: 109/68 112/62 112/72 117/77  Pulse: (!) 110 (!) 103 63 (!) 109  Resp: 18 18 18 18   Temp: 98.5 F (36.9 C) 98.1 F (36.7 C) 98.1 F (36.7 C) 98.1 F (36.7 C)  TempSrc: Oral Oral Oral Oral  SpO2: 99% 99% 99% 99%  Weight:      Height:        General - Elderly African American obese female, mild respiratory distress. HEENT - PERRLA, EOMI, atraumatic head, non tender sinuses. Lung - Clear, basal rales,diffuse rhonchi, no wheezes. Heart - S1, S2 heard, no murmurs, rubs, 1+ pedal edema. Abdomen - Soft, non tender, distended, bowel sounds poor Neuro - Alert, awake and oriented x 3, non focal exam. Skin - Warm and dry.  Data Reviewed:      Latest Ref Rng & Units 01/29/2023    3:04 AM 01/28/2023    5:31 AM 01/27/2023    4:17 AM  CBC  WBC 4.0 - 10.5 K/uL 14.3  15.9  17.5   Hemoglobin 12.0 - 15.0 g/dL 5.8  6.2  6.6   Hematocrit 36.0 - 46.0 % 18.1  18.9  20.6   Platelets 150 - 400 K/uL 283  266  252       Latest Ref Rng & Units 01/29/2023    3:04 AM 01/28/2023    5:31 AM 01/27/2023    4:17 AM  BMP  Glucose 70 - 99 mg/dL 355  732  202   BUN 8 - 23 mg/dL 57  54  62   Creatinine 0.44 - 1.00 mg/dL 54.27  0.62  37.62   Sodium 135 - 145 mmol/L  132  134  133   Potassium 3.5 - 5.1 mmol/L 3.5  3.6  3.7   Chloride 98 - 111 mmol/L 93  94  93   CO2 22 - 32 mmol/L 25  25  24    Calcium 8.9 - 10.3 mg/dL 8.5  8.4  8.0    No results found.  Family Communication: Discussed with patient, she understand and agree. All questions answereed.  Disposition: Status is: Inpatient Remains inpatient appropriate because: volume management, anemia requiring PRBC, need HD Access, PT follow up  Planned Discharge Destination: Home with Home Health  Time spent: 38 minutes  Author: Marcelino Duster, MD 01/29/2023 3:23 PM Secure chat 7am to 7pm For on call review [email or web address].

## 2023-01-29 NOTE — Plan of Care (Signed)
  Problem: Clinical Measurements: Goal: Signs and symptoms of infection will decrease Outcome: Progressing   Problem: Education: Goal: Knowledge of General Education information will improve Description: Including pain rating scale, medication(s)/side effects and non-pharmacologic comfort measures Outcome: Progressing   Problem: Activity: Goal: Risk for activity intolerance will decrease Outcome: Progressing   Problem: Coping: Goal: Level of anxiety will decrease Outcome: Progressing   Problem: Pain Management: Goal: General experience of comfort will improve Outcome: Progressing   Problem: Respiratory: Goal: Ability to maintain adequate ventilation will improve Outcome: Not Progressing   Problem: Nutrition: Goal: Adequate nutrition will be maintained Outcome: Not Progressing

## 2023-01-29 NOTE — Progress Notes (Signed)
Consent signed and in patient's chart.

## 2023-01-29 NOTE — Progress Notes (Signed)
Ptrs scheduled PD tx wes initiated per MD order---DSD per sterile protocl completed--no s/s or any redness or drainage-----gentamicin cream applied--VS are WNL---no issues with first drain or fill---checked with primary RN for any questions--

## 2023-01-29 NOTE — Progress Notes (Signed)
Peritoneal Dialysis Treatment Disconnect Note   Post-tx wt: 94.4kg Consent signedd and in chart.   Gentamicin applied and new dressing applied.  Hand-off given to the pt's nurse.  PD treatment disconnected via aseptic technique.     PD exit site clean, dry and intact.       Maple Hudson, RN [facility]

## 2023-01-29 NOTE — Consult Note (Signed)
VASCULAR SURGERY CONSULTATION   Requested by:  Wendee Beavers, NP [facility])  Reason for consultation: [facility] placement    History of Present Illness   Heidi [name] is a 67 y.o. (26-Oct-1955) female who presents with cc: SOB with swelling.  Pt is getting PD but Renal feels PD is inadequate at this point as the patient is presenting with decompensation.  They request TDC placement to start HD.  Pt notes having briefly a RIJV catheter, before transition to PD cath.  Pt was making some urine before her recent decompensation.    Past Medical History:  Diagnosis Date   Allergic rhinitis    Arthritis    sacroiliac joint   Asthma    Breast mass in female 09/05/2014   Degenerative arthritis of lumbar spine    Diabetes mellitus without complication (HCC)    Genu valgus, congenital    GERD (gastroesophageal reflux disease)    Hyperlipidemia    Hypertension    Hyponatremia    Microalbuminuria    Pes planus    Renal disorder    CKD stage 3   Tricompartment degenerative joint disease of knee    Trochanteric bursitis of both hips    Volvulus (HCC)     Past Surgical History:  Procedure Laterality Date   ABDOMINAL HYSTERECTOMY     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   BREAST BIOPSY Right 1996   benign   BREAST BIOPSY Right 07/13/2022   stereo biopsy/ x clip/ path pending   BREAST BIOPSY Right 07/13/2022   MM RT BREAST BX W LOC DEV 1ST LESION IMAGE BX SPEC STEREO GUIDE 07/13/2022 [facility]   BREAST EXCISIONAL BIOPSY Right 2016   broken ankle surgery Right 02/2021   CAPD INSERTION N/A 10/07/2021   Procedure: LAPAROSCOPIC INSERTION CONTINUOUS AMBULATORY PERITONEAL DIALYSIS  (CAPD) CATHETER;  Surgeon: Leafy Ro, MD;  Location: [facility] ORS;  Service: General;  Laterality: N/A;   COLONOSCOPY     COLONOSCOPY N/A 12/19/2019   Procedure: COLONOSCOPY;  Surgeon: Toledo, Boykin Nearing, MD;  Location: [facility] ENDOSCOPY;  Service: Gastroenterology;  Laterality: N/A;    EXPLORATORY LAPAROTOMY     of colon for torsion   INSERTION OF MESH  10/07/2021   Procedure: INSERTION OF MESH;  Surgeon: Leafy Ro, MD;  Location: [facility] ORS;  Service: General;;   NASAL FRACTURE SURGERY     UMBILICAL HERNIA REPAIR N/A 10/07/2021   Procedure: HERNIA REPAIR UMBILICAL ADULT;  Surgeon: Leafy Ro, MD;  Location: [facility] ORS;  Service: General;  Laterality: N/A;     Social History   Socioeconomic History   Marital status: Single    Spouse name: Not on file   Number of children: Not on file   Years of education: Not on file   Highest education level: Not on file  Occupational History   Not on file  Tobacco Use   Smoking status: Never   Smokeless tobacco: Never  Vaping Use   Vaping status: Never Used  Substance and Sexual Activity   Alcohol use: Yes    Comment: rarely   Drug use: No   Sexual activity: Not on file  Other Topics Concern   Not on file  Social History Narrative   Not on file   Social Drivers of Health   Financial Resource Strain: Low Risk  (10/11/2022)   Received from [facility]   Overall Financial Resource Strain (CARDIA)    Difficulty of  Paying Living Expenses: Not hard at all  Food Insecurity: No Food Insecurity (01/24/2023)   Hunger Vital Sign    Worried About Running Out of Food in the Last Year: Never true    Ran Out of Food in the Last Year: Never true  Transportation Needs: No Transportation Needs (01/24/2023)   PRAPARE - Administrator, Civil Service (Medical): No    Lack of Transportation (Non-Medical): No  Physical Activity: Inactive (03/10/2021)   Received from [facility], [facility]   Exercise Vital Sign    Days of Exercise per Week: 0 days    Minutes of Exercise per Session: 0 min  Stress: No Stress Concern Present (03/10/2021)   Received from [facility], [facility]   [employer] of Occupational Health -  Occupational Stress Questionnaire    Feeling of Stress : Only a little  Social Connections: Not on file  Intimate Partner Violence: Not At Risk (01/24/2023)   Humiliation, Afraid, Rape, and Kick questionnaire    Fear of Current or Ex-Partner: No    Emotionally Abused: No    Physically Abused: No    Sexually Abused: No    Family History  Problem Relation Age of Onset   Diabetes Mother    Diabetes Father    Cancer Father    Breast cancer Maternal Aunt     Current Facility-Administered Medications  Medication Dose Route Frequency Provider Last Rate Last Admin   acetaminophen (TYLENOL) tablet 650 mg  650 mg Oral Q6H PRN Mansy, Jan A, MD       Or   acetaminophen (TYLENOL) suppository 650 mg  650 mg Rectal Q6H PRN Mansy, Jan A, MD       albuterol (PROVENTIL) (2.5 MG/3ML) 0.083% nebulizer solution 2.5 mg  2.5 mg Inhalation Q2H PRN Irean Hong, MD   2.5 mg at 01/28/23 2156   alum & mag hydroxide-simeth (MAALOX/MYLANTA) 200-200-20 MG/5ML suspension 30 mL  30 mL Oral Q4H PRN Marcelino Duster, MD       calcitRIOL (ROCALTROL) capsule 0.25 mcg  0.25 mcg Oral Daily Mansy, Jan A, MD   0.25 mcg at 01/29/23 0830   carvedilol (COREG) tablet 12.5 mg  12.5 mg Oral BID WC Marcelino Duster, MD   12.5 mg at 01/29/23 0830   Chlorhexidine Gluconate Cloth 2 % PADS 6 each  6 each Topical Q0600 Marcelino Duster, MD   6 each at 01/29/23 0421   cloNIDine (CATAPRES) tablet 0.1 mg  0.1 mg Oral BID Mansy, Jan A, MD   0.1 mg at 01/29/23 0830   cyanocobalamin (VITAMIN B12) tablet 1,000 mcg  1,000 mcg Oral Daily Darlin Priestly, MD   1,000 mcg at 01/29/23 0831   dialysis solution 2.5% low-MG/low-CA dianeal solution   Intraperitoneal Q24H Wendee Beavers, NP   New Bag at 01/28/23 2006   dialysis solution 4.25% low-MG/low-CA dianeal solution   Intraperitoneal Q24H Wendee Beavers, NP   New Bag at 01/28/23 2006   feeding supplement (NEPRO CARB STEADY) liquid 237 mL  237 mL Oral BID BM Breeze, Gery Pray, NP    237 mL at 01/29/23 0840   fluticasone (FLONASE) 50 MCG/ACT nasal spray 2 spray  2 spray Each Nare Daily Mansy, Jan A, MD   2 spray at 01/29/23 0831   folic acid (FOLVITE) tablet 1 mg  1 mg Oral Daily Darlin Priestly, MD   1 mg at 01/29/23 0830   furosemide (LASIX) injection 80 mg  80 mg Intravenous TID Wendee Beavers, NP   80 mg at 01/29/23 7829   gentamicin cream (GARAMYCIN) 0.1 % 1 Application  1 Application Topical Daily Wendee Beavers, NP   1 Application at 01/29/23 0831   heparin injection 5,000 Units  5,000 Units Subcutaneous Q12H Marcelino Duster, MD   5,000 Units at 01/29/23 0830   insulin aspart (novoLOG) injection 0-5 Units  0-5 Units Subcutaneous QHS Marcelino Duster, MD   2 Units at 01/28/23 2222   insulin aspart (novoLOG) injection 0-6 Units  0-6 Units Subcutaneous TID WC Marcelino Duster, MD   5 Units at 01/29/23 1144   insulin aspart (novoLOG) injection 10 Units  10 Units Subcutaneous TID WC Marcelino Duster, MD   10 Units at 01/29/23 1144   insulin glargine-yfgn (SEMGLEE) injection 15 Units  15 Units Subcutaneous Daily Marcelino Duster, MD   15 Units at 01/29/23 1013   losartan (COZAAR) tablet 100 mg  100 mg Oral Daily Mansy, Jan A, MD   100 mg at 01/29/23 0829   ondansetron (ZOFRAN-ODT) disintegrating tablet 4 mg  4 mg Oral Q8H PRN Marcelino Duster, MD       pravastatin (PRAVACHOL) tablet 20 mg  20 mg Oral q1800 Mansy, Jan A, MD   20 mg at 01/28/23 1746   senna-docusate (Senokot-S) tablet 2 tablet  2 tablet Oral Daily Wendee Beavers, NP   2 tablet at 01/29/23 0829   sodium chloride flush (NS) 0.9 % injection 10 mL  10 mL Intravenous Q12H Irean Hong, MD   10 mL at 01/29/23 1048   traZODone (DESYREL) tablet 25 mg  25 mg Oral QHS PRN Mansy, Jan A, MD   25 mg at 01/28/23 2223   Vitamin D (Ergocalciferol) (DRISDOL) 1.25 MG (50000 UNIT) capsule 50,000 Units  50,000 Units Oral Q7 days Mansy, Jan A, MD   50,000 Units at 01/28/23 5621    Allergies   Allergen Reactions   Sulfa Antibiotics Nausea And Vomiting   Misc. Sulfonamide Containing Compounds Nausea And Vomiting    REVIEW OF SYSTEMS (negative unless checked):   Cardiac:  []  Chest pain or chest pressure? [x]  Shortness of breath upon activity? []  Shortness of breath when lying flat? []  Irregular heart rhythm?  Vascular:  []  Pain in calf, thigh, or hip brought on by walking? []  Pain in feet at night that wakes you up from your sleep? []  Blood clot in your veins? [x]  Leg swelling?  Pulmonary:  []  Oxygen at home? []  Productive cough? []  Wheezing?  Neurologic:  []  Sudden weakness in arms or legs? []  Sudden numbness in arms or legs? []  Sudden onset of difficult speaking or slurred speech? []  Temporary loss of vision in one eye? []  Problems with dizziness?  Gastrointestinal:  []  Blood in stool? []  Vomited blood?  Genitourinary:  []  Burning when urinating? []  Blood in urine?  Psychiatric:  []  Major depression  Hematologic:  []  Bleeding problems? []  Problems with blood clotting?  Dermatologic:  []  Rashes or ulcers?  Constitutional:  []  Fever or chills?  Ear/Nose/Throat:  []  Change in hearing? []  Nose bleeds? []  Sore throat?  Musculoskeletal:  []  Back pain? []  Joint pain? []  Muscle pain?   Physical Examination     Vitals:   01/29/23 0755 01/29/23 1200 01/29/23 1259 01/29/23 1315  BP: 109/68 112/62 112/72 117/77  Pulse: (!) 110 (!) 103 63 (!) 109  Resp: 18 18 18 18   Temp: 98.5 F (36.9 C) 98.1 F (36.7 C) 98.1 F (36.7 C)  98.1 F (36.7 C)  TempSrc: Oral Oral Oral Oral  SpO2: 99% 99% 99% 99%  Weight:      Height:       Body mass index is 37.92 kg/m.  General Alert, O x 3, Ill appearing, blood transfusing  Pulmonary Sym exp, good B air movt, BLL rales  Cardiac RRR, Nl S1, S2, no Murmurs, No rubs, No S3,S4  Vascular Vessel Right Left  Radial Faintly palpable Faintly palpable  Brachial Faintly palpable Faintly palpable  Carotid  Palpable, No Bruit Palpable, No Bruit  Aorta Not palpable N/A  Femoral Palpable Palpable  Popliteal Not palpable Not palpable  PT Not palpable Not palpable  DP Not palpable Not palpable    Gastro- intestinal soft, distended, non-tender to palpation, No guarding or rebound, no HSM, no masses, no CVAT B, No palpable prominent aortic pulse, PD catheter in place  Musculo- skeletal M/S 5/5 throughout , Extremities without ischemic changes , Non-pitting edema present: 2+ bilaterally    Laboratory   CBC    Latest Ref Rng & Units 01/29/2023    3:04 AM 01/28/2023    5:31 AM 01/27/2023    4:17 AM  CBC  WBC 4.0 - 10.5 K/uL 14.3  15.9  17.5   Hemoglobin 12.0 - 15.0 g/dL 5.8  6.2  6.6   Hematocrit 36.0 - 46.0 % 18.1  18.9  20.6   Platelets 150 - 400 K/uL 283  266  252     BMP    Latest Ref Rng & Units 01/29/2023    3:04 AM 01/28/2023    5:31 AM 01/27/2023    4:17 AM  BMP  Glucose 70 - 99 mg/dL 161  096  045   BUN 8 - 23 mg/dL 57  54  62   Creatinine 0.44 - 1.00 mg/dL 40.98  1.19  14.78   Sodium 135 - 145 mmol/L 132  134  133   Potassium 3.5 - 5.1 mmol/L 3.5  3.6  3.7   Chloride 98 - 111 mmol/L 93  94  93   CO2 22 - 32 mmol/L 25  25  24    Calcium 8.9 - 10.3 mg/dL 8.5  8.4  8.0     Coagulation Lab Results  Component Value Date   INR 1.1 01/24/2023    Medical Decision Making   Heidi [name] is a 67 y.o. female who presents with:  decompensating renal function  Renal request tunneled dialysis catheter placement due to possible deterioration of residual renal function Keep NPO after MN Will add on [facility] for Dr. Wyn Quaker or Dr. Lorretta Harp tomorrow Thank you for allowing [place] to participate in this patient's care.   Leonides Sake, MD, FACS, FSVS Covering for [facility]: [phone]  01/29/2023, 2:14 PM

## 2023-01-29 NOTE — Progress Notes (Signed)
[facility]  ROUNDING NOTE   Subjective:   Heidi [name] is a 67 y.o. female with past medical conditions including GERD, hypertension, dyslipidemia, asthma, osteoarthritis, anemia, and end-stage renal disease on peritoneal dialysis.  Patient presents to the emergency room complaining of shortness of breath and increased edema.  Patient has been admitted for Shortness of breath [R06.02] Fluid overload [E87.70] Symptomatic anemia [D64.9]  Patient is known to our practice and is followed by Dr Cherylann Ratel at [facility].   Patient seen sitting up in chair, flat affect No complaints to offer Remains on 2 L nasal cannula Lower extremity edema present  UF 1.3 L from PD treatment   Objective:  Vital signs in last 24 hours:  Temp:  [98.3 F (36.8 C)-98.5 F (36.9 C)] 98.5 F (36.9 C) (12/22 0755) Pulse Rate:  [73-110] 110 (12/22 0755) Resp:  [16-18] 18 (12/22 0755) BP: (109-118)/(60-68) 109/68 (12/22 0755) SpO2:  [95 %-100 %] 99 % (12/22 0755) Weight:  [99.1 kg-100.2 kg] 100.2 kg (12/22 0500)  Weight change: 2.1 kg Filed Weights   01/28/23 0414 01/28/23 2002 01/29/23 0500  Weight: 101 kg 99.1 kg 100.2 kg    Intake/Output: I/O last 3 completed shifts: In: 344.2 [P.O.:240; IV Piggyback:104.2] Out: 1547 [Other:1547]   Intake/Output this shift:  Total I/O In: 1081 [Other:1081] Out: 1381 [Other:1381]  Physical Exam: General: NAD, flat affect  Head: Normocephalic, atraumatic. Moist oral mucosal membranes  Eyes: Anicteric  Lungs:  Diminished in bases, [place] O2  Heart: Regular rate and rhythm  Abdomen:  Soft, nontender, PDC  Extremities:  2+ peripheral edema.  Neurologic: Alert, moving all four extremities  Skin: No lesions  Access: PD catheter    Basic Metabolic Panel: Recent Labs  Lab 01/25/23 0617 01/26/23 0454 01/27/23 0417 01/28/23 0531 01/29/23 0304  NA 132* 130* 133* 134* 132*  K 4.0 3.8 3.7 3.6 3.5  CL 94* 92* 93* 94* 93*  CO2 24 21* 24 25  25   GLUCOSE 195* 418* 397* 362* 335*  BUN 71* 66* 62* 54* 57*  CREATININE 10.70* 11.19* 10.45* 9.93* 10.26*  CALCIUM 7.3* 7.5* 8.0* 8.4* 8.5*  MG 2.0 2.0 2.0 2.1 2.2    Liver Function Tests: Recent Labs  Lab 01/24/23 0445  AST 16  ALT 25  ALKPHOS 61  BILITOT 0.3  PROT 5.3*  ALBUMIN 1.9*   No results for input(s): "LIPASE", "AMYLASE" in the last 168 hours. No results for input(s): "AMMONIA" in the last 168 hours.  CBC: Recent Labs  Lab 01/23/23 1311 01/24/23 0445 01/25/23 0841 01/26/23 0454 01/27/23 0417 01/28/23 0531 01/29/23 0304  WBC 20.5* 16.4* 15.0* 14.8* 17.5* 15.9* 14.3*  NEUTROABS 18.1* 13.7*  --   --   --   --   --   HGB 7.1* 6.4* 6.0* 6.7* 6.6* 6.2* 5.8*  HCT 21.1* 19.8* 18.8* 20.1* 20.6* 18.9* 18.1*  MCV 87.9 92.1 94.0 89.7 92.8 91.7 92.8  PLT 319 301 261 244 252 266 283    Cardiac Enzymes: No results for input(s): "CKTOTAL", "CKMB", "CKMBINDEX", "TROPONINI" in the last 168 hours.  BNP: Invalid input(s): "POCBNP"  CBG: Recent Labs  Lab 01/28/23 0815 01/28/23 1114 01/28/23 1743 01/28/23 2201 01/29/23 0817  GLUCAP 329* 396* 304* 227* 371*    Microbiology: Results for orders placed or performed during the hospital encounter of 01/23/23  SARS Coronavirus 2 by RT PCR (hospital order, performed in [facility] hospital lab) *cepheid single result test* Anterior Nasal Swab     Status:  None   Collection Time: 01/23/23  1:08 PM   Specimen: Anterior Nasal Swab  Result Value Ref Range Status   SARS Coronavirus 2 by RT PCR NEGATIVE NEGATIVE Final    Comment: (NOTE) SARS-CoV-2 target nucleic acids are NOT DETECTED.  The SARS-CoV-2 RNA is generally detectable in upper and lower respiratory specimens during the acute phase of infection. The lowest concentration of SARS-CoV-2 viral copies this assay can detect is 250 copies / mL. A negative result does not preclude SARS-CoV-2 infection and should not be used as the sole basis for treatment or  other patient management decisions.  A negative result may occur with improper specimen collection / handling, submission of specimen other than nasopharyngeal swab, presence of viral mutation(s) within the areas targeted by this assay, and inadequate number of viral copies (<250 copies / mL). A negative result must be combined with clinical observations, patient history, and epidemiological information.  Fact Sheet for Patients:   RoadLapTop.co.za  Fact Sheet for Healthcare Providers: http://kim-miller.com/  This test is not yet approved or  cleared by the [place] FDA and has been authorized for detection and/or diagnosis of SARS-CoV-2 by FDA under an Emergency Use Authorization (EUA).  This EUA will remain in effect (meaning this test can be used) for the duration of the COVID-19 declaration under Section 564(b)(1) of the Act, 21 U.S.C. section 360bbb-3(b)(1), unless the authorization is terminated or revoked sooner.  Performed at [facility], [address] [place], [place] [address]   Urine Culture (for pregnant, neutropenic or urologic patients or patients with an indwelling urinary catheter)     Status: Abnormal   Collection Time: 01/24/23  2:02 AM   Specimen: Urine, Catheterized  Result Value Ref Range Status   Specimen Description   Final    URINE, CATHETERIZED Performed at [facility], [address], [place], [place] [address]    Special Requests   Final    NONE Performed at [facility], [address], [place], [place] [address]    Culture (A)  Final    <10,000 COLONIES/mL INSIGNIFICANT GROWTH Performed at [facility], [address] [place], [place] [address]    Report Status 01/25/2023 FINAL  Final  Culture, blood (x 2)     Status: None   Collection Time: 01/24/23  5:10 AM   Specimen: BLOOD  Result Value Ref Range Status   Specimen Description BLOOD LEFT ASSIST  CONTROL  Final   Special Requests   Final    BOTTLES DRAWN AEROBIC AND ANAEROBIC Blood Culture results may not be optimal due to an inadequate volume of blood received in culture bottles   Culture   Final    NO GROWTH 5 DAYS Performed at [facility], [address] [place], [place] [address]    Report Status 01/29/2023 FINAL  Final  Culture, blood (x 2)     Status: None   Collection Time: 01/24/23  5:10 AM   Specimen: BLOOD  Result Value Ref Range Status   Specimen Description BLOOD LEFT HAND  Final   Special Requests   Final    BOTTLES DRAWN AEROBIC AND ANAEROBIC Blood Culture results may not be optimal due to an inadequate volume of blood received in culture bottles   Culture   Final    NO GROWTH 5 DAYS Performed at [facility], [address] [place], [place] [address]    Report Status 01/29/2023 FINAL  Final    Coagulation Studies:  No results for input(s): "LABPROT", "INR" in the last 72 hours.   Urinalysis: No results for input(s): "COLORURINE", "LABSPEC", "PHURINE", "GLUCOSEU", "HGBUR", "BILIRUBINUR", "KETONESUR", "PROTEINUR", "UROBILINOGEN", "NITRITE", "LEUKOCYTESUR" in the last 72 hours.  Invalid input(s): "APPERANCEUR"     Imaging: No results found.    Medications:    dialysis solution 2.5% low-MG/low-CA     dialysis solution 4.25% low-MG/low-CA      calcitRIOL  0.25 mcg Oral Daily   carvedilol  12.5 mg Oral BID WC   Chlorhexidine Gluconate Cloth  6 each Topical Q0600   cloNIDine  0.1 mg Oral BID   vitamin B-12  1,000 mcg Oral Daily   feeding supplement (NEPRO CARB STEADY)  237 mL Oral BID BM   fluticasone  2 spray Each Nare Daily   folic acid  1 mg Oral Daily   furosemide  80 mg Intravenous TID   gentamicin cream  1 Application Topical Daily   heparin  5,000 Units Subcutaneous Q12H   insulin aspart  0-5 Units Subcutaneous QHS   insulin aspart  0-6 Units Subcutaneous TID WC   insulin aspart  10 Units Subcutaneous TID WC    insulin glargine-yfgn  15 Units Subcutaneous Daily   losartan  100 mg Oral Daily   pravastatin  20 mg Oral q1800   senna-docusate  2 tablet Oral Daily   sodium chloride flush  10 mL Intravenous Q12H   Vitamin D (Ergocalciferol)  50,000 Units Oral Q7 days   acetaminophen **OR** acetaminophen, albuterol, alum & mag hydroxide-simeth, ondansetron, traZODone  Assessment/ Plan:  Ms. CAELIN CASSEL is a 67 y.o.  female  with past medical conditions including GERD, hypertension, dyslipidemia, asthma, osteoarthritis, anemia, and end-stage renal disease on peritoneal dialysis.  Patient presents to the emergency room complaining of shortness of breath and increased edema.  Patient has been admitted for Shortness of breath [R06.02] Fluid overload [E87.70] Symptomatic anemia [D64.9]  [facility] PD  End stage renal disease on peritoneal dialysis. No missed outpatient treatments. Decreased urination may have lead to fluid overload, insufficient dialysis. Will dialyze patient tonight with a 4.25% and 2.5% dialysate to optimize fluid removal.   -Patient has had 2 CT scans, both with contrast, back in March and May.  We feel this may have greatly decreased her residual renal function.  Continue IV furosemide 80 mg 3 times daily.   - Continues to have adequate UF from PD, 1.3L.  - We feel patient would benefit more from transitioning to hemodialysis at this time due to fluid management concerns. Patient agreeable  - Will consult  vascular surgery for permcath placement early next week.  - Patient will need outpatient dialysis clinic. Will notify renal navigator next week.  -Will continue PD tonight at current order  2. Anemia of chronic kidney disease  Lab Results  Component Value Date   HGB 5.8 (L) 01/29/2023    Hgb 5.8, blood transfusions have been given during this admission.  Patient does have current suspicion of renal carcinoma, will defer ESA's due to to this.  Notified primary team of decreased  hemoglobin and will defer need of blood transfusion to them.   3. Secondary Hyperparathyroidism: with outpatient labs: none  Lab Results  Component Value Date   CALCIUM 8.5 (L) 01/29/2023   CAION 1.26 10/07/2021  Calcium is acceptable.   4.  Hypertension with chronic kidney disease.  Home regimen includes amlodipine, carvedilol, clonidine, hydralazine, losartan, and metoprolol.   Amlodipine, hydralazine, and metoprolol currently held.  Blood pressure  109/68   LOS: 5 Assyria Morreale 12/22/202411:33 AM

## 2023-01-29 NOTE — Progress Notes (Signed)
Patients transfusion is complete, BP is stable and no reactions at this time.

## 2023-01-30 ENCOUNTER — Encounter
Admission: EM | Disposition: A | Discharge status: Home / Self Care | Payer: Self-pay | Source: Home / Self Care | Attending: Hospitalist

## 2023-01-30 ENCOUNTER — Other Ambulatory Visit: Payer: Self-pay

## 2023-01-30 DIAGNOSIS — N186 End stage renal disease: Secondary | ICD-10-CM | POA: Diagnosis not present

## 2023-01-30 DIAGNOSIS — Z992 Dependence on renal dialysis: Secondary | ICD-10-CM | POA: Diagnosis not present

## 2023-01-30 DIAGNOSIS — E8779 Other fluid overload: Secondary | ICD-10-CM | POA: Diagnosis not present

## 2023-01-30 HISTORY — PX: DIALYSIS/PERMA CATHETER INSERTION: CATH118288

## 2023-01-30 LAB — GLUCOSE, CAPILLARY
Glucose-Capillary: 273 mg/dL — ABNORMAL HIGH (ref 70–99)
Glucose-Capillary: 216 mg/dL — ABNORMAL HIGH (ref 70–99)
Glucose-Capillary: 195 mg/dL — ABNORMAL HIGH (ref 70–99)
Glucose-Capillary: 128 mg/dL — ABNORMAL HIGH (ref 70–99)

## 2023-01-30 LAB — CBC
HCT: 22.2 % — ABNORMAL LOW (ref 36.0–46.0)
Hemoglobin: 7.1 g/dL — ABNORMAL LOW (ref 12.0–15.0)
MCH: 29.8 pg (ref 26.0–34.0)
MCHC: 32 g/dL (ref 30.0–36.0)
MCV: 93.3 fL (ref 80.0–100.0)
Platelets: 297 10*3/uL (ref 150–400)
RBC: 2.38 MIL/uL — ABNORMAL LOW (ref 3.87–5.11)
RDW: 15.1 % (ref 11.5–15.5)
WBC: 15 10*3/uL — ABNORMAL HIGH (ref 4.0–10.5)
nRBC: 0 % (ref 0.0–0.2)

## 2023-01-30 LAB — BASIC METABOLIC PANEL
Anion gap: 16 — ABNORMAL HIGH (ref 5–15)
BUN: 52 mg/dL — ABNORMAL HIGH (ref 8–23)
CO2: 28 mmol/L (ref 22–32)
Calcium: 8.8 mg/dL — ABNORMAL LOW (ref 8.9–10.3)
Chloride: 91 mmol/L — ABNORMAL LOW (ref 98–111)
Creatinine, Ser: 9.94 mg/dL — ABNORMAL HIGH (ref 0.44–1.00)
GFR, Estimated: 4 mL/min — ABNORMAL LOW (ref >60)
Glucose, Bld: 297 mg/dL — ABNORMAL HIGH (ref 70–99)
Potassium: 3.5 mmol/L (ref 3.5–5.1)
Sodium: 135 mmol/L (ref 135–145)

## 2023-01-30 LAB — KAPPA/LAMBDA LIGHT CHAINS
Kappa free light chain: 137.3 mg/L — ABNORMAL HIGH (ref 3.3–19.4)
Kappa, lambda light chain ratio: 1.15 (ref 0.26–1.65)
Lambda free light chains: 119.7 mg/L — ABNORMAL HIGH (ref 5.7–26.3)

## 2023-01-30 SURGERY — DIALYSIS/PERMA CATHETER INSERTION
Anesthesia: Choice

## 2023-01-30 MED ORDER — ALTEPLASE 2 MG IJ SOLR
2.0000 mg | Freq: Once | INTRAMUSCULAR | Status: DC | PRN
  Filled 2023-01-30: qty 2

## 2023-01-30 MED ORDER — DIPHENHYDRAMINE HCL 50 MG/ML IJ SOLN: 50.0000 mg | Freq: Once | INTRAMUSCULAR | Status: DC | PRN

## 2023-01-30 MED ORDER — HEPARIN SODIUM (PORCINE) 1000 UNIT/ML DIALYSIS
1000.0000 [IU] | INTRAMUSCULAR | Status: DC | PRN
Start: 2023-01-30 — End: 2023-02-04

## 2023-01-30 MED ORDER — FENTANYL CITRATE PF 50 MCG/ML IJ SOSY
PREFILLED_SYRINGE | INTRAMUSCULAR | Status: AC
  Filled 2023-01-30: qty 1

## 2023-01-30 MED ORDER — CEFAZOLIN SODIUM-DEXTROSE 1-4 GM/50ML-% IV SOLN
1.0000 g | INTRAVENOUS | Status: AC
  Administered 2023-01-30: 1 g via INTRAVENOUS
  Filled 2023-01-30: qty 50

## 2023-01-30 MED ORDER — MIDAZOLAM HCL 2 MG/2ML IJ SOLN
INTRAMUSCULAR | Status: AC
Start: 2023-01-30 — End: ?
  Filled 2023-01-30: qty 2

## 2023-01-30 MED ORDER — FENTANYL CITRATE (PF) 100 MCG/2ML IJ SOLN
INTRAMUSCULAR | Status: DC | PRN
  Administered 2023-01-30: 50 ug via INTRAVENOUS

## 2023-01-30 MED ORDER — CEFAZOLIN SODIUM-DEXTROSE 1-4 GM/50ML-% IV SOLN
INTRAVENOUS | Status: AC
  Filled 2023-01-30: qty 50

## 2023-01-30 MED ORDER — SODIUM CHLORIDE 0.9 % IV SOLN: INTRAVENOUS | Status: DC

## 2023-01-30 MED ORDER — HEPARIN (PORCINE) IN NACL 1000-0.9 UT/500ML-% IV SOLN
INTRAVENOUS | Status: DC | PRN
  Administered 2023-01-30: 500 mL

## 2023-01-30 MED ORDER — MIDAZOLAM HCL 2 MG/ML PO SYRP
8.0000 mg | ORAL_SOLUTION | Freq: Once | ORAL | Status: DC | PRN
  Filled 2023-01-30: qty 5

## 2023-01-30 MED ORDER — FAMOTIDINE 20 MG PO TABS: 40.0000 mg | ORAL_TABLET | Freq: Once | ORAL | Status: DC | PRN

## 2023-01-30 MED ORDER — METHYLPREDNISOLONE SODIUM SUCC 125 MG IJ SOLR: 125.0000 mg | Freq: Once | INTRAMUSCULAR | Status: DC | PRN

## 2023-01-30 MED ORDER — FENTANYL CITRATE PF 50 MCG/ML IJ SOSY: 12.5000 ug | PREFILLED_SYRINGE | Freq: Once | INTRAMUSCULAR | Status: DC | PRN

## 2023-01-30 MED ORDER — MIDAZOLAM HCL 2 MG/2ML IJ SOLN
INTRAMUSCULAR | Status: DC | PRN
  Administered 2023-01-30: 1 mg via INTRAVENOUS

## 2023-01-30 MED ORDER — HEPARIN SODIUM (PORCINE) 10000 UNIT/ML IJ SOLN
INTRAMUSCULAR | Status: DC | PRN
  Administered 2023-01-30: 10000 [IU]

## 2023-01-30 SURGICAL SUPPLY — 7 items
BIOPATCH RED 1 DISK 7.0 (GAUZE/BANDAGES/DRESSINGS) IMPLANT
CATH CANNON HEMO 15FR 19 (HEMODIALYSIS SUPPLIES) IMPLANT
COVER PROBE ULTRASOUND 5X96 (MISCELLANEOUS) IMPLANT
DERMABOND ADVANCED .7 DNX12 (GAUZE/BANDAGES/DRESSINGS) IMPLANT
PACK ANGIOGRAPHY (CUSTOM PROCEDURE TRAY) IMPLANT
SUT MNCRL AB 4-0 PS2 18 (SUTURE) IMPLANT
SUT PROLENE 0 CT 1 30 (SUTURE) IMPLANT

## 2023-01-30 NOTE — Progress Notes (Addendum)
  Late entry for 1941   Received patient in bed to unit.  Alert and oriented.  Informed consent signed and in chart.   TX duration: 2 hours took off Tx 15 minutes early d/t venous pressures  Patient tolerated well.  Transported back to the room  Alert, without acute distress.  Hand-off given to patient's nurse Ronna Polio  Access used: right chest tunneled Dialysis cath Access issues: venous pressures elevated  Total UF removed: 0 Medication(s) given: none Post HD VS: BP 141/988 P 53 Temp 97 02 sat 96% RA Post HD weight: 99.6kg  Freddie Breech, RN Kidney Dialysis Unit

## 2023-01-30 NOTE — Discharge Planning (Signed)
Informed by Nephrology that patient is transitioning to Hemodialysis and will need placement.   Dimas Chyle Dialysis Coordinator II  [facility] Cell: [phone] eFax: [phone] [email or web address]

## 2023-01-30 NOTE — Progress Notes (Signed)
Arrived to pts room to take her off of the completed PD treatment/machine---please see CCPD flowsheet for values Per nephrology NP---pt is not to on PD tonight--is transitioning over to HD---will notify [facility] Nurse Manager Eugenie Norrie RN PD catheter is clamped and capped per sterile technique and secured to the pts abdomen PD Dressing is dry and intact--

## 2023-01-30 NOTE — Inpatient Diabetes Management (Signed)
Inpatient Diabetes Program Recommendations  AACE/ADA: New Consensus Statement on Inpatient Glycemic Control   Target Ranges:  Prepandial:   less than 140 mg/dL      Peak postprandial:   less than 180 mg/dL (1-2 hours)      Critically ill patients:  140 - 180 mg/dL     Review of Glycemic Control  Latest Reference Range & Units 01/29/23 08:17 01/29/23 11:41 01/29/23 16:28 01/29/23 21:07 01/30/23 07:28 01/30/23 11:13  Glucose-Capillary 70 - 99 mg/dL 161 (H) 096 (H) 045 (H) 243 (H) 273 (H) 216 (H)   Diabetes history: DM2 Outpatient Diabetes medications: 70/30 10 units QAM Current orders for Inpatient glycemic control:  Novolog 0-6 units + hs Semglee 15 units Daily Novolog 15 units tid meal coverage  Nepro bid between meals Peritoneal dialysis  Inpatient Diabetes Program Recommendations:     Increase Semglee to 20 units  Thanks, Christena Deem RN, MSN, BC-ADM Inpatient Diabetes Coordinator Team Pager [phone] (8a-5p)

## 2023-01-30 NOTE — Op Note (Signed)
OPERATIVE NOTE    PRE-OPERATIVE DIAGNOSIS: 1. ESRD   POST-OPERATIVE DIAGNOSIS: same as above  PROCEDURE: Ultrasound guidance for vascular access to the right internal jugular vein Fluoroscopic guidance for placement of catheter Placement of a 19 cm tip to cuff tunneled hemodialysis catheter via the right internal jugular vein  SURGEON: [name], MD  ANESTHESIA:  Local with Moderate conscious sedation for approximately 20 minutes using 1 mg of Versed and 50 mcg of Fentanyl  ESTIMATED BLOOD LOSS: 5 cc  FLUORO TIME: less than one minute  CONTRAST: none  FINDING(S): 1.  Patent right internal jugular vein  SPECIMEN(S):  None  INDICATIONS:   Heidi [name] is a 67 y.o.female who presents with renal failure.  The patient needs long term dialysis access for their ESRD, and a Permcath is necessary.  Risks and benefits are discussed and informed consent is obtained.    DESCRIPTION: After obtaining full informed written consent, the patient was brought back to the vascular suited. The patient's right neck and chest were sterilely prepped and draped in a sterile surgical field was created. Moderate conscious sedation was administered during a face to face encounter with the patient throughout the procedure with my supervision of the RN administering medicines and monitoring the patient's vital signs, pulse oximetry, telemetry and mental status throughout from the start of the procedure until the patient was taken to the recovery room.  The right internal jugular vein was visualized with ultrasound and found to be patent. It was then accessed under direct ultrasound guidance and a permanent image was recorded. A wire was placed. After skin nick and dilatation, the peel-away sheath was placed over the wire. I then turned my attention to an area under the clavicle. Approximately 1-2 fingerbreadths below the clavicle a small counterincision was created and tunneled from the subclavicular incision  to the access site. Using fluoroscopic guidance, a 19 centimeter tip to cuff tunneled hemodialysis catheter was selected, and tunneled from the subclavicular incision to the access site. It was then placed through the peel-away sheath and the peel-away sheath was removed. Using fluoroscopic guidance the catheter tips were parked in the right atrium. The appropriate distal connectors were placed. It withdrew blood well and flushed easily with heparinized saline and a concentrated heparin solution was then placed. It was secured to the chest wall with 2 Prolene sutures. The access incision was closed single 4-0 Monocryl. A 4-0 Monocryl pursestring suture was placed around the exit site. Sterile dressings were placed. The patient tolerated the procedure well and was taken to the recovery room in stable condition.  COMPLICATIONS: None  CONDITION: Stable  [name], MD 01/30/2023 12:49 PM   This note was created with Dragon Medical transcription system. Any errors in dictation are purely unintentional.

## 2023-01-30 NOTE — TOC Progression Note (Signed)
Transition of Care [place]) - Progression Note    Patient Details  Name: Heidi [name] MRN: [id number] Date of Birth: 1955-07-18  [facility] CM/SW Contact  [name], RN Phone Number: 01/30/2023, 8:48 AM  Clinical Narrative:     Patient still on acute O2 Noted that plan is for patient to have perm cath placed and transition to HD.  Dimas Chyle HD liaison notified        Expected Discharge Plan and Services                                               Social Determinants of Health (SDOH) Interventions SDOH Screenings   Food Insecurity: No Food Insecurity (01/24/2023)  Housing: Low Risk  (01/25/2023)  Transportation Needs: No Transportation Needs (01/24/2023)  Utilities: Not At Risk (01/24/2023)  Financial Resource Strain: Low Risk  (10/11/2022)   Received [facility]  Physical Activity: Inactive (03/10/2021)   Received from [facility], [facility]  Stress: No Stress Concern Present (03/10/2021)   [facility] from [facility], [facility]  Tobacco Use: Low Risk  (01/25/2023)    Readmission Risk Interventions    01/27/2023    1:16 PM  Readmission Risk Prevention Plan  Transportation Screening Complete  Medication Review (RN CM) Complete

## 2023-01-30 NOTE — Progress Notes (Signed)
  Progress Note    01/30/2023 10:19 AM * No surgery found *  Subjective:  Heidi [name] is a 67 yo female who presented to [facility] ER with SOB and swelling.  Pt is getting PD but Renal feels PD is inadequate at this point as the patient is presenting with decompensation.  They request TDC placement to start HD.  Pt notes having briefly a RIJV catheter, before transition to PD cath.  Pt was making some urine before her recent decompensation.     Vitals:   01/29/23 1929 01/30/23 0314  BP: 106/62 131/69  Pulse: 68 71  Resp: 20 16  Temp: 98.5 F (36.9 C) 99.4 F (37.4 C)  SpO2: 100% 99%   Physical Exam: Cardiac:  RRR, normal S1 and S2, no murmurs appreciated. Lungs: Clear on auscultation except in the bases with bilateral lower rales.  Normal labored breathing. Incisions: None Extremities: Upper extremity pulses are palpable.  Bilateral lower extremities due to edema nonpalpable. Abdomen: Positive bowel sounds but faint.  Soft, distended and nontender to palpation. Neurologic: Alert and oriented x 3, answers all questions and follows commands appropriately.  CBC    Component Value Date/Time   WBC 15.0 (H) 01/30/2023 0426   RBC 2.38 (L) 01/30/2023 0426   HGB 7.1 (L) 01/30/2023 0426   HCT 22.2 (L) 01/30/2023 0426   PLT 297 01/30/2023 0426   MCV 93.3 01/30/2023 0426   MCH 29.8 01/30/2023 0426   MCHC 32.0 01/30/2023 0426   RDW 15.1 01/30/2023 0426   LYMPHSABS 0.8 01/24/2023 0445   MONOABS 1.5 (H) 01/24/2023 0445   EOSABS 0.1 01/24/2023 0445   BASOSABS 0.0 01/24/2023 0445    BMET    Component Value Date/Time   NA 135 01/30/2023 0426   K 3.5 01/30/2023 0426   CL 91 (L) 01/30/2023 0426   CO2 28 01/30/2023 0426   GLUCOSE 297 (H) 01/30/2023 0426   BUN 52 (H) 01/30/2023 0426   CREATININE 9.94 (H) 01/30/2023 0426   CALCIUM 8.8 (L) 01/30/2023 0426   GFRNONAA 4 (L) 01/30/2023 0426   GFRAA 34 (L) 04/25/2016 2228    INR    Component Value Date/Time   INR 1.1 01/24/2023  0510     Intake/Output Summary (Last 24 hours) at 01/30/2023 1019 Last data filed at 01/29/2023 1515 Gross per 24 hour  Intake 508 ml  Output --  Net 508 ml     Assessment/Plan:  67 y.o. female who presents to [facility] emergency department with shortness of breath and swelling.  Patient has a history of peritoneal dialysis which is not working well.  Nephrology asked vascular surgery to place a permacatheter.* No surgery found *   PLAN: Vascular surgery plans on taking the patient to the vascular lab later today on 01/30/2023 for dialysis permacatheter placement.  I discussed in detail at the bedside this morning with the patient the procedure, benefits, risk, and complications.  Patient verbalized her understanding.  Patient would like to proceed to soon as possible.  I answered all the patient's questions.  Patient has been made n.p.o. for the procedure.  I discussed the plan in detail with Dr. [name] MD and he is in agreement with the plan.  DVT prophylaxis: Heparin 5000 units subcu every 8 hours.   Marcie Bal [facility] 01/30/2023 10:19 AM

## 2023-01-30 NOTE — Progress Notes (Signed)
  PROGRESS NOTE    Heidi [name]  BJY:[id number] DOB: August 08, 1955 DOA: 01/23/2023 PCP: Rayetta Humphrey, MD  [id number]  LOS: 6 days   Brief hospital course:   Assessment & Plan: Heidi [name] is a 67 y.o. female with medical history significant for asthma, osteoarthritis, GERD, hypertension, dyslipidemia, stage III CKD, who presented to the emergency room with acute onset of dyspnea with associated orthopnea and worsening lower extremity edema.    She Is admitted for fluid overload due to insufficient PD at home. She had PD while inpatient with no improvement of her symptoms. Nephrology plan to initiate HD next week. She is weak and short of breath, has constipation. Her Hb is dropping, possibly due to hemodilution and ESRD. Her sugars are very high. Insulin regimen adjusted. She is pending HD access tomorrow.   * Fluid overload  Acute pulmonary edema ESRD on peritoneal dialysis. Fluid overload likely due to decreased urination and/or insufficient PD at home. Echo shows EF 60%, grade 2 diastolic dysfunction. She has trouble breathing even with rest. Continue IV lasix 80 mg TID, albumin per nephrology team. --dialysis cath placed today, start HD   Acute on chronic anemia Folate def No active bleeding.  S/p 2u pRBC  Per nephro can't get erythropoietin due to suspicion of RCC. Continue to monitor Hgb and transfuse to keep Hgb >7 Continue folic acid and vit B12 supplement. --consider GI consult   Sepsis, ruled out UTI, ruled out Urine Cultures show insignificant growth.   No antibiotics needed. WBC 14.3, possibly reactive Continue to trend.    Type 2 diabetes mellitus with chronic kidney disease (HCC) With Hyperglycemia A1c 6.1 but blood sugars very high. Low A1c due to severe anemia. --cont glargine 15u daily --mealtime 15u TID   Essential hypertension Continue coreg, clonidine, losartan --hold amlodipine and hydralazine   Dyslipidemia Continue statin therapy.     DVT prophylaxis: Heparin SQ Code Status: Full code  Family Communication:  Level of care: Med-Surg Dispo:   The patient is from: home Anticipated d/c is to: home Anticipated d/c date is: 2-3 days   Subjective and Interval History:  Dialysis cath placed today, pt received HD.     Objective: Vitals:   01/30/23 1830 01/30/23 1901 01/30/23 1931 01/30/23 1939  BP: (!) 142/90 134/66    Pulse: (!) 30 81 60 77  Resp: (!) 25 (!) 21 (!) 24 (!) 23  Temp:      TempSrc:      SpO2: 93% 95% 94% 96%  Weight:      Height:        Intake/Output Summary (Last 24 hours) at 01/30/2023 1943 Last data filed at 01/30/2023 1107 Gross per 24 hour  Intake --  Output 2240 ml  Net -2240 ml   Filed Weights   01/30/23 0500 01/30/23 1157 01/30/23 1710  Weight: 99.4 kg 99.4 kg 99.4 kg    Examination:   Constitutional: NAD, AAOx3 HEENT: conjunctivae and lids normal, EOMI CV: No cyanosis.   RESP: normal respiratory effort Neuro: II - XII grossly intact.   Psych: Normal mood and affect.  Appropriate judgement and reason   Data Reviewed: I have personally reviewed labs and imaging studies  Time spent: 50 minutes  Darlin Priestly, MD [facility] If 7PM-7AM, please contact night-coverage 01/30/2023, 7:43 PM

## 2023-01-30 NOTE — Progress Notes (Signed)
[facility]  ROUNDING NOTE   Subjective:   Heidi [name] is a 67 y.o. female with past medical conditions including GERD, hypertension, dyslipidemia, asthma, osteoarthritis, anemia, and end-stage renal disease on peritoneal dialysis.  Patient presents to the emergency room complaining of shortness of breath and increased edema.  Patient has been admitted for Shortness of breath [R06.02] Fluid overload [E87.70] Symptomatic anemia [D64.9]  Patient is known to our practice and is followed by Dr Cherylann Ratel at [facility].   Patient seen resting in bed Denies pain Flat affect NPO due to permcath placement  UF 1.8L from PD treatment   Objective:  Vital signs in last 24 hours:  Temp:  [98.1 F (36.7 C)-99.4 F (37.4 C)] 99.4 F (37.4 C) (12/23 0314) Pulse Rate:  [63-109] 71 (12/23 0314) Resp:  [16-20] 16 (12/23 0314) BP: (106-136)/(62-77) 131/69 (12/23 0314) SpO2:  [99 %-100 %] 99 % (12/23 0314) Weight:  [99.4 kg] 99.4 kg (12/23 0500)  Weight change: 0.329 kg Filed Weights   01/28/23 2002 01/29/23 0500 01/30/23 0500  Weight: 99.1 kg 100.2 kg 99.4 kg    Intake/Output: I/O last 3 completed shifts: In: 1829 [P.O.:480; Blood:268; Other:1081] Out: 1381 [Other:1381]   Intake/Output this shift:  No intake/output data recorded.  Physical Exam: General: NAD, flat affect  Head: Normocephalic, atraumatic. Moist oral mucosal membranes  Eyes: Anicteric  Lungs:  Diminished in bases, [place] O2  Heart: Regular rate and rhythm  Abdomen:  Soft, nontender, PDC  Extremities:  2+ peripheral edema.  Neurologic: Alert, moving all four extremities  Skin: No lesions  Access: PD catheter    Basic Metabolic Panel: Recent Labs  Lab 01/25/23 0617 01/26/23 0454 01/27/23 0417 01/28/23 0531 01/29/23 0304 01/30/23 0426  NA 132* 130* 133* 134* 132* 135  K 4.0 3.8 3.7 3.6 3.5 3.5  CL 94* 92* 93* 94* 93* 91*  CO2 24 21* 24 25 25 28   GLUCOSE 195* 418* 397* 362* 335* 297*  BUN  71* 66* 62* 54* 57* 52*  CREATININE 10.70* 11.19* 10.45* 9.93* 10.26* 9.94*  CALCIUM 7.3* 7.5* 8.0* 8.4* 8.5* 8.8*  MG 2.0 2.0 2.0 2.1 2.2  --     Liver Function Tests: Recent Labs  Lab 01/24/23 0445  AST 16  ALT 25  ALKPHOS 61  BILITOT 0.3  PROT 5.3*  ALBUMIN 1.9*   No results for input(s): "LIPASE", "AMYLASE" in the last 168 hours. No results for input(s): "AMMONIA" in the last 168 hours.  CBC: Recent Labs  Lab 01/23/23 1311 01/24/23 0445 01/25/23 0841 01/27/23 0417 01/28/23 0531 01/29/23 0304 01/29/23 2055 01/30/23 0426  WBC 20.5* 16.4*   < > 17.5* 15.9* 14.3* 15.4* 15.0*  NEUTROABS 18.1* 13.7*  --   --   --   --   --   --   HGB 7.1* 6.4*   < > 6.6* 6.2* 5.8* 7.0* 7.1*  HCT 21.1* 19.8*   < > 20.6* 18.9* 18.1* 22.3* 22.2*  MCV 87.9 92.1   < > 92.8 91.7 92.8 92.5 93.3  PLT 319 301   < > 252 266 283 275 297   < > = values in this interval not displayed.    Cardiac Enzymes: No results for input(s): "CKTOTAL", "CKMB", "CKMBINDEX", "TROPONINI" in the last 168 hours.  BNP: Invalid input(s): "POCBNP"  CBG: Recent Labs  Lab 01/29/23 0817 01/29/23 1141 01/29/23 1628 01/29/23 2107 01/30/23 0728  GLUCAP 371* 356* 225* 243* 273*    Microbiology: Results for orders placed  or performed during the hospital encounter of 01/23/23  SARS Coronavirus 2 by RT PCR (hospital order, performed in [facility] hospital lab) *cepheid single result test* Anterior Nasal Swab     Status: None   Collection Time: 01/23/23  1:08 PM   Specimen: Anterior Nasal Swab  Result Value Ref Range Status   SARS Coronavirus 2 by RT PCR NEGATIVE NEGATIVE Final    Comment: (NOTE) SARS-CoV-2 target nucleic acids are NOT DETECTED.  The SARS-CoV-2 RNA is generally detectable in upper and lower respiratory specimens during the acute phase of infection. The lowest concentration of SARS-CoV-2 viral copies this assay can detect is 250 copies / mL. A negative result does not preclude SARS-CoV-2  infection and should not be used as the sole basis for treatment or other patient management decisions.  A negative result may occur with improper specimen collection / handling, submission of specimen other than nasopharyngeal swab, presence of viral mutation(s) within the areas targeted by this assay, and inadequate number of viral copies (<250 copies / mL). A negative result must be combined with clinical observations, patient history, and epidemiological information.  Fact Sheet for Patients:   RoadLapTop.co.za  Fact Sheet for Healthcare Providers: http://kim-miller.com/  This test is not yet approved or  cleared by the [place] FDA and has been authorized for detection and/or diagnosis of SARS-CoV-2 by FDA under an Emergency Use Authorization (EUA).  This EUA will remain in effect (meaning this test can be used) for the duration of the COVID-19 declaration under Section 564(b)(1) of the Act, 21 U.S.C. section 360bbb-3(b)(1), unless the authorization is terminated or revoked sooner.  Performed at [facility], [address], [place], [place] [address]   Urine Culture (for pregnant, neutropenic or urologic patients or patients with an indwelling urinary catheter)     Status: Abnormal   Collection Time: 01/24/23  2:02 AM   Specimen: Urine, Catheterized  Result Value Ref Range Status   Specimen Description   Final    URINE, CATHETERIZED Performed at [facility], [address], [place], [place] [address]    Special Requests   Final    NONE Performed at [facility], [address], [place], [place] [address]    Culture (A)  Final    <10,000 COLONIES/mL INSIGNIFICANT GROWTH Performed at [facility], [address], [place], [place] [address]    Report Status 01/25/2023 FINAL  Final  Culture, blood (x 2)     Status: None   Collection Time: 01/24/23  5:10 AM   Specimen: BLOOD  Result  Value Ref Range Status   Specimen Description BLOOD LEFT ASSIST CONTROL  Final   Special Requests   Final    BOTTLES DRAWN AEROBIC AND ANAEROBIC Blood Culture results may not be optimal due to an inadequate volume of blood received in culture bottles   Culture   Final    NO GROWTH 5 DAYS Performed at [facility], [address] [place], [place] [address]    Report Status 01/29/2023 FINAL  Final  Culture, blood (x 2)     Status: None   Collection Time: 01/24/23  5:10 AM   Specimen: BLOOD  Result Value Ref Range Status   Specimen Description BLOOD LEFT HAND  Final   Special Requests   Final    BOTTLES DRAWN AEROBIC AND ANAEROBIC Blood Culture results may not be optimal due to an inadequate volume of blood received in culture bottles   Culture  Final    NO GROWTH 5 DAYS Performed at [facility], [address] [place], [place] [address]    Report Status 01/29/2023 FINAL  Final    Coagulation Studies: No results for input(s): "LABPROT", "INR" in the last 72 hours.   Urinalysis: No results for input(s): "COLORURINE", "LABSPEC", "PHURINE", "GLUCOSEU", "HGBUR", "BILIRUBINUR", "KETONESUR", "PROTEINUR", "UROBILINOGEN", "NITRITE", "LEUKOCYTESUR" in the last 72 hours.  Invalid input(s): "APPERANCEUR"     Imaging: No results found.    Medications:    dialysis solution 2.5% low-MG/low-CA     dialysis solution 4.25% low-MG/low-CA      calcitRIOL  0.25 mcg Oral Daily   carvedilol  12.5 mg Oral BID WC   Chlorhexidine Gluconate Cloth  6 each Topical Q0600   cloNIDine  0.1 mg Oral BID   vitamin B-12  1,000 mcg Oral Daily   feeding supplement (NEPRO CARB STEADY)  237 mL Oral BID BM   fluticasone  2 spray Each Nare Daily   folic acid  1 mg Oral Daily   furosemide  80 mg Intravenous TID   gentamicin cream  1 Application Topical Daily   heparin  5,000 Units Subcutaneous Q12H   insulin aspart  0-5 Units Subcutaneous QHS   insulin aspart  0-6 Units  Subcutaneous TID WC   insulin aspart  15 Units Subcutaneous TID WC   insulin glargine-yfgn  15 Units Subcutaneous Daily   losartan  100 mg Oral Daily   pravastatin  20 mg Oral q1800   senna-docusate  2 tablet Oral Daily   sodium chloride flush  10 mL Intravenous Q12H   Vitamin D (Ergocalciferol)  50,000 Units Oral Q7 days   acetaminophen **OR** acetaminophen, albuterol, alum & mag hydroxide-simeth, ondansetron, traZODone  Assessment/ Plan:  Ms. Heidi [name] is a 67 y.o.  female  with past medical conditions including GERD, hypertension, dyslipidemia, asthma, osteoarthritis, anemia, and end-stage renal disease on peritoneal dialysis.  Patient presents to the emergency room complaining of shortness of breath and increased edema.  Patient has been admitted for Shortness of breath [R06.02] Fluid overload [E87.70] Symptomatic anemia [D64.9]  [facility] PD  End stage renal disease on peritoneal dialysis. No missed outpatient treatments. Decreased urination may have lead to fluid overload, insufficient dialysis. Will dialyze patient tonight with a 4.25% and 2.5% dialysate to optimize fluid removal.   -Patient has had 2 CT scans, both with contrast, back in March and May.  We feel this may have greatly decreased her residual renal function.  Continue IV furosemide 80 mg 3 times daily.   - Adequate UF from PD, 1.8L.  - Currently NPO for permcath placement - Will initiate hemodialysis once placed. - Patient will need outpatient dialysis clinic. Renal navigator notified -Will hold PD tonight  2. Anemia of chronic kidney disease  Lab Results  Component Value Date   HGB 7.1 (L) 01/30/2023    Hgb 7.1, blood transfusions have been given during this admission.  Patient does have current suspicion of renal carcinoma, will defer ESA's due to this. Patient received blood transfusion yesterday  3. Secondary Hyperparathyroidism: with outpatient labs: none  Lab Results  Component Value Date   CALCIUM 8.8  (L) 01/30/2023   CAION 1.26 10/07/2021  Will continue to monitor bone minerals during this admission.   4.  Hypertension with chronic kidney disease.  Home regimen includes amlodipine, carvedilol, clonidine, hydralazine, losartan, and metoprolol.   Amlodipine, hydralazine, and metoprolol currently held.  Blood pressure stable   LOS: 6 Darnise Montag  Harpreet Pompey 12/23/202410:36 AM

## 2023-01-30 NOTE — Care Management Important Message (Signed)
Important Message  Patient Details  Name: Heidi [name] MRN: [id number] Date of Birth: 03-Aug-1955   Important Message Given:  Yes - Medicare IM     [name] 01/30/2023, 11:41 AM

## 2023-01-31 ENCOUNTER — Encounter: Payer: Self-pay | Admitting: Vascular Surgery

## 2023-01-31 DIAGNOSIS — D649 Anemia, unspecified: Secondary | ICD-10-CM

## 2023-01-31 DIAGNOSIS — E8770 Fluid overload, unspecified: Secondary | ICD-10-CM | POA: Diagnosis not present

## 2023-01-31 DIAGNOSIS — E8779 Other fluid overload: Secondary | ICD-10-CM | POA: Diagnosis not present

## 2023-01-31 LAB — CBC
HCT: 19.8 % — ABNORMAL LOW (ref 36.0–46.0)
HCT: 22 % — ABNORMAL LOW (ref 36.0–46.0)
Hemoglobin: 6.5 g/dL — ABNORMAL LOW (ref 12.0–15.0)
Hemoglobin: 6.9 g/dL — ABNORMAL LOW (ref 12.0–15.0)
MCH: 30 pg (ref 26.0–34.0)
MCH: 29.7 pg (ref 26.0–34.0)
MCHC: 32.8 g/dL (ref 30.0–36.0)
MCHC: 31.4 g/dL (ref 30.0–36.0)
MCV: 91.2 fL (ref 80.0–100.0)
MCV: 94.8 fL (ref 80.0–100.0)
Platelets: 264 10*3/uL (ref 150–400)
Platelets: 276 10*3/uL (ref 150–400)
RBC: 2.17 MIL/uL — ABNORMAL LOW (ref 3.87–5.11)
RBC: 2.32 MIL/uL — ABNORMAL LOW (ref 3.87–5.11)
RDW: 14.5 % (ref 11.5–15.5)
RDW: 14.6 % (ref 11.5–15.5)
WBC: 13.7 10*3/uL — ABNORMAL HIGH (ref 4.0–10.5)
WBC: 14.5 10*3/uL — ABNORMAL HIGH (ref 4.0–10.5)
nRBC: 0 % (ref 0.0–0.2)
nRBC: 0 % (ref 0.0–0.2)

## 2023-01-31 LAB — BASIC METABOLIC PANEL
Anion gap: 11 (ref 5–15)
BUN: 41 mg/dL — ABNORMAL HIGH (ref 8–23)
CO2: 27 mmol/L (ref 22–32)
Calcium: 8.4 mg/dL — ABNORMAL LOW (ref 8.9–10.3)
Chloride: 95 mmol/L — ABNORMAL LOW (ref 98–111)
Creatinine, Ser: 8.16 mg/dL — ABNORMAL HIGH (ref 0.44–1.00)
GFR, Estimated: 5 mL/min — ABNORMAL LOW (ref >60)
Glucose, Bld: 105 mg/dL — ABNORMAL HIGH (ref 70–99)
Potassium: 3.4 mmol/L — ABNORMAL LOW (ref 3.5–5.1)
Sodium: 133 mmol/L — ABNORMAL LOW (ref 135–145)

## 2023-01-31 LAB — GLUCOSE, CAPILLARY
Glucose-Capillary: 93 mg/dL (ref 70–99)
Glucose-Capillary: 78 mg/dL (ref 70–99)
Glucose-Capillary: 171 mg/dL — ABNORMAL HIGH (ref 70–99)

## 2023-01-31 LAB — MAGNESIUM: Magnesium: 2 mg/dL (ref 1.7–2.4)

## 2023-01-31 MED ORDER — AMLODIPINE BESYLATE 10 MG PO TABS
10.0000 mg | ORAL_TABLET | Freq: Every day | ORAL | Status: DC
  Administered 2023-01-31 – 2023-02-04 (×5): 10 mg via ORAL
  Filled 2023-01-31 (×5): qty 1

## 2023-01-31 MED ORDER — SODIUM CHLORIDE 0.9% IV SOLUTION: Freq: Once | INTRAVENOUS | Status: DC

## 2023-01-31 NOTE — Progress Notes (Signed)
  Progress Note    01/31/2023 10:28 AM 1 Day Post-Op  Subjective: Heidi [name] is a 67 year old female now status postop day 1 from right chest dialysis permacatheter access placement.  Patient endorses some soreness around the site but otherwise no complaints.  No hematoma seroma to note.  Dressings clean dry and intact.  No complaints overnight.  Vitals all remained stable.   Vitals:   01/31/23 0442 01/31/23 0744  BP: 125/68 (!) 159/76  Pulse: 72 73  Resp: 20 16  Temp: 99.3 F (37.4 C) 98.7 F (37.1 C)  SpO2: 100% 97%   Physical Exam: Cardiac:  RRR, normal S1 and S2.  No murmurs appreciated. Lungs: Clear on auscultation except in the bases with bilateral lower rales.  Normal labored breathing. Incisions: Right chest with dialysis permacatheter insertion.  Dressing clean dry and intact.  No hematoma seroma to note. Extremities: Upper extremity pulses are palpable.  Bilateral lower extremities due to edema nonpalpable. Abdomen: Positive bowel sounds but faint.  Soft, distended and nontender to palpation. Neurologic: Alert and oriented x 3, answers all questions and follows commands appropriately. CBC    Component Value Date/Time   WBC 13.7 (H) 01/31/2023 0330   RBC 2.17 (L) 01/31/2023 0330   HGB 6.5 (L) 01/31/2023 0330   HCT 19.8 (L) 01/31/2023 0330   PLT 264 01/31/2023 0330   MCV 91.2 01/31/2023 0330   MCH 30.0 01/31/2023 0330   MCHC 32.8 01/31/2023 0330   RDW 14.5 01/31/2023 0330   LYMPHSABS 0.8 01/24/2023 0445   MONOABS 1.5 (H) 01/24/2023 0445   EOSABS 0.1 01/24/2023 0445   BASOSABS 0.0 01/24/2023 0445    BMET    Component Value Date/Time   NA 133 (L) 01/31/2023 0330   K 3.4 (L) 01/31/2023 0330   CL 95 (L) 01/31/2023 0330   CO2 27 01/31/2023 0330   GLUCOSE 105 (H) 01/31/2023 0330   BUN 41 (H) 01/31/2023 0330   CREATININE 8.16 (H) 01/31/2023 0330   CALCIUM 8.4 (L) 01/31/2023 0330   GFRNONAA 5 (L) 01/31/2023 0330   GFRAA 34 (L) 04/25/2016 2228    INR     Component Value Date/Time   INR 1.1 01/24/2023 0510     Intake/Output Summary (Last 24 hours) at 01/31/2023 1028 Last data filed at 01/31/2023 0400 Gross per 24 hour  Intake 240 ml  Output 2241 ml  Net -2001 ml     Assessment/Plan:  67 y.o. female is s/p dialysis permacatheter access placement.  1 Day Post-Op   PLAN Dialysis access permacatheter placement working well without any difficulties.  No complications to note.  Vascular surgery signs off at this time.  DVT prophylaxis: Heparin with dialysis.   Heidi [name] [facility] 01/31/2023 10:28 AM

## 2023-01-31 NOTE — TOC Progression Note (Signed)
Transition of Care [facility]) - Progression Note    Patient Details  Name: Heidi [name] MRN: [id number] Date of Birth: 04-Aug-1955  Transition of Care [facility]) CM/SW Contact  [name], RN Phone Number: 01/31/2023, 8:50 AM  Clinical Narrative:     Noted patient weaned to RA  Dialysis coordinator working on placement for Hemodialysis as patient is transitioning from PD       Expected Discharge Plan and Services                                               Social Determinants of Health (SDOH) Interventions SDOH Screenings   Food Insecurity: No Food Insecurity (01/24/2023)  Housing: Low Risk  (01/25/2023)  Transportation Needs: No Transportation Needs (01/24/2023)  Utilities: Not At Risk (01/24/2023)  Financial Resource Strain: Low Risk  (10/11/2022)   Received from [facility]  Physical Activity: Inactive (03/10/2021)   Received from [facility], [facility]  Stress: No Stress Concern Present (03/10/2021)   Received from [facility], [facility]  Tobacco Use: Low Risk  (01/25/2023)    Readmission Risk Interventions    01/27/2023    1:16 PM  Readmission Risk Prevention Plan  Transportation Screening Complete  Medication Review (RN CM) Complete

## 2023-01-31 NOTE — Progress Notes (Signed)
  PROGRESS NOTE    Heidi [name]  KKX:[id number] DOB: [birth year] DOA: 01/23/2023 PCP: Rayetta Humphrey, MD  [id number]  LOS: 7 days   Brief hospital course:   Assessment & Plan: Heidi [name] is a 67 y.o. female with medical history significant for asthma, osteoarthritis, GERD, hypertension, dyslipidemia, stage III CKD, who presented to the emergency room with acute onset of dyspnea with associated orthopnea and worsening lower extremity edema.    She Is admitted for fluid overload due to insufficient PD at home. She had PD while inpatient with no improvement of her symptoms. Nephrology plan to initiate HD next week. She is weak and short of breath, has constipation. Her Hb is dropping, possibly due to hemodilution and ESRD. Her sugars are very high. Insulin regimen adjusted. She is pending HD access tomorrow.   * Fluid overload  Acute pulmonary edema ESRD on peritoneal dialysis. Fluid overload likely due to decreased urination and/or insufficient PD at home. Echo shows EF 60%, grade 2 diastolic dysfunction. She has trouble breathing even with rest. Continue IV lasix 80 mg TID, albumin per nephrology team. --dialysis cath placed, start HD on 12/23 --iHD per nephro   Acute on chronic anemia Folate def No active bleeding.  S/p 2u pRBC  Per nephro can't get erythropoietin due to suspicion of RCC. Continue to monitor Hgb and transfuse to keep Hgb >7 Continue folic acid and vit B12 supplement. --3rd unit pRBC today --GI consult and hematology consult   Sepsis, ruled out UTI, ruled out Urine Cultures show insignificant growth.   No antibiotics needed. WBC 14.3, possibly reactive Continue to trend.    Type 2 diabetes mellitus with chronic kidney disease (HCC) With Hyperglycemia A1c 6.1 but blood sugars very high. Low A1c due to severe anemia. --cont glargine 15u daily --mealtime 15u TID --ACHS and SSI   Essential hypertension Continue coreg, clonidine, losartan --resume  amlodipine --hold hydralazine   Dyslipidemia Continue statin therapy.   Hypokalemia --correct with HD   DVT prophylaxis: SCD/Compression stockings Code Status: Full code  Family Communication:  Level of care: Med-Surg Dispo:   The patient is from: home Anticipated d/c is to: home Anticipated d/c date is: 2-3 days   Subjective and Interval History:  Continued to drop Hgb.  No abdominal pain.   Objective: Vitals:   01/31/23 1425 01/31/23 1436 01/31/23 1514 01/31/23 1940  BP: (!) 153/77  (!) 149/66 (!) 155/71  Pulse: 79  71 85  Resp: (!) 22  20 16   Temp: 98.9 F (37.2 C)  98.7 F (37.1 C) 99.1 F (37.3 C)  TempSrc: Oral  Oral Oral  SpO2: 97%  98% 97%  Weight:  95.1 kg    Height:        Intake/Output Summary (Last 24 hours) at 01/31/2023 2005 Last data filed at 01/31/2023 1425 Gross per 24 hour  Intake 555 ml  Output 1501 ml  Net -946 ml   Filed Weights   01/31/23 0442 01/31/23 1134 01/31/23 1436  Weight: 93.7 kg 96.4 kg 95.1 kg    Examination:   Constitutional: NAD, AAOx3 HEENT: conjunctivae and lids normal, EOMI CV: No cyanosis.   RESP: normal respiratory effort Neuro: II - XII grossly intact.   Psych: Normal mood and affect.  Appropriate judgement and reason    Data Reviewed: I have personally reviewed labs and imaging studies  Time spent: 50 minutes  Darlin Priestly, MD [facility] If 7PM-7AM, please contact night-coverage 01/31/2023, 8:05 PM

## 2023-01-31 NOTE — Progress Notes (Signed)
  Received patient in bed to unit.   Informed consent signed and in chart.    TX duration: 2.5hrs     Transported back to floor  Hand-off given to patient's nurse. No c/o and no distress noted    Access used: R HD Catheter  Access issues: none  dressing changed    Total UF removed: 1.5l Medication(s) given: none  Recived 1 unit blood   Post HD VS: wnl Post HD weight: 95.1kg     Lynann Beaver  [facility]

## 2023-01-31 NOTE — Plan of Care (Signed)
  Problem: Fluid Volume: Goal: Hemodynamic stability will improve Outcome: Progressing   Problem: Clinical Measurements: Goal: Diagnostic test results will improve Outcome: Progressing Goal: Signs and symptoms of infection will decrease Outcome: Progressing   Problem: Education: Goal: Knowledge of General Education information will improve Description: Including pain rating scale, medication(s)/side effects and non-pharmacologic comfort measures Outcome: Progressing   Problem: Health Behavior/Discharge Planning: Goal: Ability to manage health-related needs will improve Outcome: Progressing   Problem: Clinical Measurements: Goal: Respiratory complications will improve Outcome: Progressing Goal: Cardiovascular complication will be avoided Outcome: Progressing   Problem: Nutrition: Goal: Adequate nutrition will be maintained Outcome: Progressing   Problem: Elimination: Goal: Will not experience complications related to bowel motility Outcome: Progressing   Problem: Pain Management: Goal: General experience of comfort will improve Outcome: Progressing   Problem: Safety: Goal: Ability to remain free from injury will improve Outcome: Progressing   Problem: Skin Integrity: Goal: Risk for impaired skin integrity will decrease Outcome: Progressing   Problem: Skin Integrity: Goal: Risk for impaired skin integrity will decrease Outcome: Progressing   Problem: Tissue Perfusion: Goal: Adequacy of tissue perfusion will improve Outcome: Progressing

## 2023-01-31 NOTE — Consult Note (Signed)
[name], MD [facility]  [address], [address] [place], [place] [address] Phone: [phone] Fax : [phone]  Consultation  Referring Provider:     Dr. Fran Lowes Primary Care Physician:  Rayetta Humphrey, MD Primary Gastroenterologist:  Dr. Norma Fredrickson         Reason for Consultation:     Anemia  Date of Admission:  01/23/2023 Date of Consultation:  01/31/2023         HPI:   Heidi [name] is a 67 y.o. female who has a history of an admission to the hospital on the 16th of this month for shortness of breath.  The patient has a history of peritoneal dialysis with end-stage renal disease, asthma, osteoarthritis, GERD, dyslipidemia, hypertension and was admitted with a report of lower extremity edema.  The patient's hemoglobin on admission was 7.1 that went down to 6.4 then to 6.0.  The patient was noted to be 6.5 today.  It was reported that the patient has received 3 units of blood since admission.  The patient denies any hematemesis abdominal pain nausea vomiting black stools or bloody stools.  The patient's last colonoscopy was in 2021 and 3 years ago she was found to have diverticulosis without any sign of polyps bleeding or cancer.  A repeat was recommended in 5 years. Despite the 3 units of blood the patient has had no signs of any GI blood loss. The patient had normal iron 7 days ago with a normal iron saturation but a repeat 4 days ago showed iron to be 17 with a iron saturation of 13. The patient reports that she has had anemia reported to her for many years. The patient had kappa/lambda light chains sent off that showed:  Component     Latest Ref Rng 01/27/2023  Kappa free light chain     3.3 - 19.4 mg/L 137.3 (H)   Lambda free light chains     5.7 - 26.3 mg/L 119.7 (H)   Kappa, lambda light chain ratio     0.26 - 1.65  1.15    The patient has been followed by vascular surgery, cardiology and nephrology during this admission.  Besides the labs above no other hematology workup  was done except for a normal folate and a normal B12.  The patient's ferritin is also not decreased and showed a level of 1379.  Past Medical History:  Diagnosis Date   Allergic rhinitis    Arthritis    sacroiliac joint   Asthma    Breast mass in female 09/05/2014   Degenerative arthritis of lumbar spine    Diabetes mellitus without complication (HCC)    Genu valgus, congenital    GERD (gastroesophageal reflux disease)    Hyperlipidemia    Hypertension    Hyponatremia    Microalbuminuria    Pes planus    Renal disorder    CKD stage 3   Tricompartment degenerative joint disease of knee    Trochanteric bursitis of both hips    Volvulus (HCC)     Past Surgical History:  Procedure Laterality Date   ABDOMINAL HYSTERECTOMY     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   BREAST BIOPSY Right 1996   benign   BREAST BIOPSY Right 07/13/2022   stereo biopsy/ x clip/ path pending   BREAST BIOPSY Right 07/13/2022   MM RT BREAST BX W LOC DEV 1ST LESION IMAGE BX SPEC STEREO GUIDE 07/13/2022 [facility]   BREAST EXCISIONAL BIOPSY  Right 2016   broken ankle surgery Right 02/2021   CAPD INSERTION N/A 10/07/2021   Procedure: LAPAROSCOPIC INSERTION CONTINUOUS AMBULATORY PERITONEAL DIALYSIS  (CAPD) CATHETER;  Surgeon: Leafy Ro, MD;  Location: [facility];  Service: General;  Laterality: N/A;   COLONOSCOPY     COLONOSCOPY N/A 12/19/2019   Procedure: COLONOSCOPY;  Surgeon: Toledo, Boykin Nearing, MD;  Location: [facility];  Service: Gastroenterology;  Laterality: N/A;   DIALYSIS/PERMA CATHETER INSERTION N/A 01/30/2023   Procedure: DIALYSIS/PERMA CATHETER INSERTION;  Surgeon: Annice Needy, MD;  Location: [facility];  Service: Cardiovascular;  Laterality: N/A;   EXPLORATORY LAPAROTOMY     of colon for torsion   INSERTION OF MESH  10/07/2021   Procedure: INSERTION OF MESH;  Surgeon: Leafy Ro, MD;  Location: [facility];  Service: General;;   NASAL FRACTURE SURGERY     UMBILICAL  HERNIA REPAIR N/A 10/07/2021   Procedure: HERNIA REPAIR UMBILICAL ADULT;  Surgeon: Leafy Ro, MD;  Location: [facility];  Service: General;  Laterality: N/A;    Prior to Admission medications   Medication Sig Start Date End Date Taking? Authorizing Provider  albuterol (VENTOLIN HFA) 108 (90 Base) MCG/ACT inhaler Inhale into the lungs every 6 (six) hours as needed for wheezing or shortness of breath.   Yes [provider]  calcitRIOL (ROCALTROL) 0.25 MCG capsule Take 0.25 mcg by mouth daily.   Yes [provider]  carvedilol (COREG) 12.5 MG tablet Take 12.5 mg by mouth in the morning, at noon, and at bedtime.   Yes [provider]  cloNIDine (CATAPRES) 0.1 MG tablet Take 0.1 mg by mouth 2 (two) times daily.   Yes [provider]  fluticasone (FLONASE) 50 MCG/ACT nasal spray Place into both nostrils daily.   Yes [provider]  hydrALAZINE (APRESOLINE) 25 MG tablet Take 25 mg by mouth 2 times daily at 12 noon and 4 pm.   Yes [provider]  losartan (COZAAR) 100 MG tablet Take by mouth. 04/12/16  Yes [provider]  lovastatin (MEVACOR) 20 MG tablet Take by mouth. 01/16/14 01/24/23 Yes [provider]  metoprolol tartrate (LOPRESSOR) 100 MG tablet Take 100 mg by mouth 2 (two) times daily. 02/02/17 01/24/23 Yes [provider]  NOVOLIN 70/30 (70-30) 100 UNIT/ML injection Inject into the skin.   Yes [provider]  Vitamin D, Ergocalciferol, (DRISDOL) 1.25 MG (50000 UNIT) CAPS capsule Take 50,000 Units by mouth every 7 (seven) days.   Yes [provider]  amLODipine (NORVASC) 10 MG tablet Take 10 mg by mouth daily. 01/16/14 10/07/21  [provider]  Lancets [facility] DELICA PLUS [place]) MISC  03/04/22   [provider]  NOVOLIN N 100 UNIT/ML injection Taking 20 units in am and 15 units at bedtime Patient not taking: Reported on 01/24/2023 04/20/17   [provider]   PRECISION QID TEST test strip 1 each (1 strip total) 3 (three) times daily Use as instructed. 03/04/22 06/17/23  [provider]    Family History  Problem Relation Age of Onset   Diabetes Mother    Diabetes Father    Cancer Father    Breast cancer Maternal Aunt      Social History   Tobacco Use   Smoking status: Never   Smokeless tobacco: Never  Vaping Use   Vaping status: Never Used  Substance Use Topics   Alcohol use: Yes    Comment: rarely   Drug use: No  Allergies as of 01/23/2023 - Review Complete 01/23/2023  Allergen Reaction Noted   Sulfa antibiotics Nausea And Vomiting 01/08/2015   Misc. sulfonamide containing compounds Nausea And Vomiting 03/16/2012    Review of Systems:    All systems reviewed and negative except where noted in HPI.   Physical Exam:  Vital signs in last 24 hours: Temp:  [97 F (36.1 C)-99.3 F (37.4 C)] 98.9 F (37.2 C) (12/24 1425) Pulse Rate:  [30-84] 79 (12/24 1425) Resp:  [12-28] 22 (12/24 1425) BP: (125-162)/(65-98) 153/77 (12/24 1425) SpO2:  [93 %-100 %] 97 % (12/24 1425) Weight:  [93.7 kg-99.6 kg] 95.1 kg (12/24 1436) Last BM Date : 01/29/23 General:   Pleasant, cooperative in NAD Head:  Normocephalic and atraumatic. Eyes:   No icterus.   Conjunctiva pink. PERRLA. Ears:  Normal auditory acuity. Neck:  Supple; no masses or thyroidomegaly Lungs: Respirations even and unlabored. Lungs clear to auscultation bilaterally.   No wheezes, crackles, or rhonchi.  Heart:  Regular rate and rhythm;  Without murmur, clicks, rubs or gallops Abdomen:  Soft, nondistended, nontender. Normal bowel sounds. No appreciable masses or hepatomegaly.  No rebound or guarding.  Rectal:  Not performed. Msk:  Symmetrical without gross deformities.   Extremities: Slight lower extremity edema, cyanosis or clubbing. Neurologic:  Alert and oriented x3;  grossly normal neurologically. Skin:  Intact without significant lesions or rashes. Cervical  Nodes:  No significant cervical adenopathy. Psych:  Alert and cooperative. Normal affect.  LAB RESULTS: Recent Labs    01/30/23 0426 01/31/23 0330 01/31/23 1141  WBC 15.0* 13.7* 14.5*  HGB 7.1* 6.5* 6.9*  HCT 22.2* 19.8* 22.0*  PLT 297 264 276   BMET Recent Labs    01/29/23 0304 01/30/23 0426 01/31/23 0330  NA 132* 135 133*  K 3.5 3.5 3.4*  CL 93* 91* 95*  CO2 25 28 27   GLUCOSE 335* 297* 105*  BUN 57* 52* 41*  CREATININE 10.26* 9.94* 8.16*  CALCIUM 8.5* 8.8* 8.4*   LFT No results for input(s): "PROT", "ALBUMIN", "AST", "ALT", "ALKPHOS", "BILITOT", "BILIDIR", "IBILI" in the last 72 hours. PT/INR No results for input(s): "LABPROT", "INR" in the last 72 hours.  STUDIES: PERIPHERAL VASCULAR CATHETERIZATION Result Date: 01/30/2023 See surgical note for result.     Impression / Plan:   Assessment: Principal Problem:   Fluid overload Active Problems:   Acute on chronic anemia   Essential hypertension   Type 2 diabetes mellitus with chronic kidney disease (HCC)   Dyslipidemia   Sepsis due to gram-negative UTI (HCC)   Heidi [name] is a 67 y.o. y/o female with anemia with a normal MCV and iron studies not totally consistent with iron deficiency anemia with a elevated ferritin and iron that was normal 7 days ago but has decreased 4 days ago.  The patient has had a transfusion of 3 units since admission and has no sign of any GI blood loss despite the continued drop in hemoglobin and transfusion of 900 cc of blood.  I would expect if this was a GI bleed there would be some sign of blood in the stools such as melena or bloody stools.  She reports the stools to be brown.  Plan:  I would recommend the patient being seen by hematology.  I would also recommend the patient having her haptoglobin checked and I will order that.  If the hematology team feels that this patient is losing blood from the GI tract and not from another source then I  will then discuss a luminal  evaluation with this patient which would include a repeat colonoscopy and an upper endoscopy.  The patient has been explained the plan and agrees with it.  Thank you for involving me in the care of this patient.      LOS: 7 days   [name], MD, [facility] 01/31/2023, 3:10 PM,  Pager [phone] 7am-5pm  Check AMION for 5pm -7am coverage and on weekends   Note: This dictation was prepared with Dragon dictation along with smaller phrase technology. Any transcriptional errors that result from this process are unintentional.

## 2023-01-31 NOTE — Progress Notes (Signed)
[facility]  ROUNDING NOTE   Subjective:   Heidi [name] is a 67 y.o. female with past medical conditions including GERD, hypertension, dyslipidemia, asthma, osteoarthritis, anemia, and end-stage renal disease on peritoneal dialysis.  Patient presents to the emergency room complaining of shortness of breath and increased edema.  Patient has been admitted for Shortness of breath [R06.02] Fluid overload [E87.70] Symptomatic anemia [D64.9]  Patient is known to our practice and is followed by Dr Cherylann Ratel at [facility].   Patient seen and evaluated during dialysis   HEMODIALYSIS FLOWSHEET:  Blood Flow Rate (mL/min): 249 mL/min Arterial Pressure (mmHg): -78.98 mmHg Venous Pressure (mmHg): 117.57 mmHg TMP (mmHg): 30.7 mmHg Ultrafiltration Rate (mL/min): 880 mL/min Dialysate Flow Rate (mL/min): 299 ml/min  Appears more interactive today Room air  Objective:  Vital signs in last 24 hours:  Temp:  [97 F (36.1 C)-99.3 F (37.4 C)] 98.4 F (36.9 C) (12/24 1134) Pulse Rate:  [30-118] 79 (12/24 1230) Resp:  [14-28] 20 (12/24 1230) BP: (125-162)/(65-98) 152/71 (12/24 1230) SpO2:  [93 %-100 %] 96 % (12/24 1230) Weight:  [93.7 kg-99.6 kg] 96.4 kg (12/24 1134)  Weight change: -0.028 kg Filed Weights   01/30/23 1941 01/31/23 0442 01/31/23 1134  Weight: 99.6 kg 93.7 kg 96.4 kg    Intake/Output: I/O last 3 completed shifts: In: 240 [P.O.:240] Out: 2241 [Urine:1; Other:2240]   Intake/Output this shift:  No intake/output data recorded.  Physical Exam: General: NAD  Head: Normocephalic, atraumatic. Moist oral mucosal membranes  Eyes: Anicteric  Lungs:  Diminished in bases, Room air  Heart: Regular rate and rhythm  Abdomen:  Soft, nontender, PDC  Extremities:  2+ peripheral edema.  Neurologic: Alert, moving all four extremities  Skin: No lesions  Access: PD catheter    Basic Metabolic Panel: Recent Labs  Lab 01/26/23 0454 01/27/23 0417 01/28/23 0531  01/29/23 0304 01/30/23 0426 01/31/23 0330  NA 130* 133* 134* 132* 135 133*  K 3.8 3.7 3.6 3.5 3.5 3.4*  CL 92* 93* 94* 93* 91* 95*  CO2 21* 24 25 25 28 27   GLUCOSE 418* 397* 362* 335* 297* 105*  BUN 66* 62* 54* 57* 52* 41*  CREATININE 11.19* 10.45* 9.93* 10.26* 9.94* 8.16*  CALCIUM 7.5* 8.0* 8.4* 8.5* 8.8* 8.4*  MG 2.0 2.0 2.1 2.2  --  2.0    Liver Function Tests: No results for input(s): "AST", "ALT", "ALKPHOS", "BILITOT", "PROT", "ALBUMIN" in the last 168 hours.  No results for input(s): "LIPASE", "AMYLASE" in the last 168 hours. No results for input(s): "AMMONIA" in the last 168 hours.  CBC: Recent Labs  Lab 01/29/23 0304 01/29/23 2055 01/30/23 0426 01/31/23 0330 01/31/23 1141  WBC 14.3* 15.4* 15.0* 13.7* 14.5*  HGB 5.8* 7.0* 7.1* 6.5* 6.9*  HCT 18.1* 22.3* 22.2* 19.8* 22.0*  MCV 92.8 92.5 93.3 91.2 94.8  PLT 283 275 297 264 276    Cardiac Enzymes: No results for input(s): "CKTOTAL", "CKMB", "CKMBINDEX", "TROPONINI" in the last 168 hours.  BNP: Invalid input(s): "POCBNP"  CBG: Recent Labs  Lab 01/30/23 0728 01/30/23 1113 01/30/23 1559 01/30/23 2032 01/31/23 0744  GLUCAP 273* 216* 195* 128* 93    Microbiology: Results for orders placed or performed during the hospital encounter of 01/23/23  SARS Coronavirus 2 by RT PCR (hospital order, performed in [facility] hospital lab) *cepheid single result test* Anterior Nasal Swab     Status: None   Collection Time: 01/23/23  1:08 PM   Specimen: Anterior Nasal Swab  Result Value  Ref Range Status   SARS Coronavirus 2 by RT PCR NEGATIVE NEGATIVE Final    Comment: (NOTE) SARS-CoV-2 target nucleic acids are NOT DETECTED.  The SARS-CoV-2 RNA is generally detectable in upper and lower respiratory specimens during the acute phase of infection. The lowest concentration of SARS-CoV-2 viral copies this assay can detect is 250 copies / mL. A negative result does not preclude SARS-CoV-2 infection and should not be used  as the sole basis for treatment or other patient management decisions.  A negative result may occur with improper specimen collection / handling, submission of specimen other than nasopharyngeal swab, presence of viral mutation(s) within the areas targeted by this assay, and inadequate number of viral copies (<250 copies / mL). A negative result must be combined with clinical observations, patient history, and epidemiological information.  Fact Sheet for Patients:   RoadLapTop.co.za  Fact Sheet for Healthcare Providers: http://kim-miller.com/  This test is not yet approved or  cleared by the [place] FDA and has been authorized for detection and/or diagnosis of SARS-CoV-2 by FDA under an Emergency Use Authorization (EUA).  This EUA will remain in effect (meaning this test can be used) for the duration of the COVID-19 declaration under Section 564(b)(1) of the Act, 21 U.S.C. section 360bbb-3(b)(1), unless the authorization is terminated or revoked sooner.  Performed at [facility], [address], [place], [place] [address]   Urine Culture (for pregnant, neutropenic or urologic patients or patients with an indwelling urinary catheter)     Status: Abnormal   Collection Time: 01/24/23  2:02 AM   Specimen: Urine, Catheterized  Result Value Ref Range Status   Specimen Description   Final    URINE, CATHETERIZED Performed at [facility], [address], [place], [place] [address]    Special Requests   Final    NONE Performed at [facility], [address], [place], [place] [address]    Culture (A)  Final    <10,000 COLONIES/mL INSIGNIFICANT GROWTH Performed at [facility], [address] [place], [place] [address]    Report Status 01/25/2023 FINAL  Final  Culture, blood (x 2)     Status: None   Collection Time: 01/24/23  5:10 AM   Specimen: BLOOD  Result Value Ref Range Status   Specimen  Description BLOOD LEFT ASSIST CONTROL  Final   Special Requests   Final    BOTTLES DRAWN AEROBIC AND ANAEROBIC Blood Culture results may not be optimal due to an inadequate volume of blood received in culture bottles   Culture   Final    NO GROWTH 5 DAYS Performed at [facility], [address] [place], [place] [address]    Report Status 01/29/2023 FINAL  Final  Culture, blood (x 2)     Status: None   Collection Time: 01/24/23  5:10 AM   Specimen: BLOOD  Result Value Ref Range Status   Specimen Description BLOOD LEFT HAND  Final   Special Requests   Final    BOTTLES DRAWN AEROBIC AND ANAEROBIC Blood Culture results may not be optimal due to an inadequate volume of blood received in culture bottles   Culture   Final    NO GROWTH 5 DAYS Performed at [facility], [address] [place], [place] [address]    Report Status 01/29/2023 FINAL  Final    Coagulation Studies: No results for input(s): "LABPROT", "INR" in the last 72 hours.   Urinalysis: No results for input(s): "  COLORURINE", "LABSPEC", "PHURINE", "GLUCOSEU", "HGBUR", "BILIRUBINUR", "KETONESUR", "PROTEINUR", "UROBILINOGEN", "NITRITE", "LEUKOCYTESUR" in the last 72 hours.  Invalid input(s): "APPERANCEUR"     Imaging: PERIPHERAL VASCULAR CATHETERIZATION Result Date: 01/30/2023 See surgical note for result.     Medications:    dialysis solution 2.5% low-MG/low-CA     dialysis solution 4.25% low-MG/low-CA      sodium chloride   Intravenous Once   calcitRIOL  0.25 mcg Oral Daily   carvedilol  12.5 mg Oral BID WC   Chlorhexidine Gluconate Cloth  6 each Topical Q0600   cloNIDine  0.1 mg Oral BID   vitamin B-12  1,000 mcg Oral Daily   feeding supplement (NEPRO CARB STEADY)  237 mL Oral BID BM   fluticasone  2 spray Each Nare Daily   folic acid  1 mg Oral Daily   furosemide  80 mg Intravenous TID   gentamicin cream  1 Application Topical Daily   heparin  5,000 Units Subcutaneous Q12H    insulin aspart  0-5 Units Subcutaneous QHS   insulin aspart  0-6 Units Subcutaneous TID WC   insulin aspart  15 Units Subcutaneous TID WC   insulin glargine-yfgn  15 Units Subcutaneous Daily   losartan  100 mg Oral Daily   pravastatin  20 mg Oral q1800   senna-docusate  2 tablet Oral Daily   sodium chloride flush  10 mL Intravenous Q12H   Vitamin D (Ergocalciferol)  50,000 Units Oral Q7 days   acetaminophen **OR** acetaminophen, albuterol, alteplase, alum & mag hydroxide-simeth, heparin, ondansetron, traZODone  Assessment/ Plan:  Heidi [name] is a 67 y.o.  female  with past medical conditions including GERD, hypertension, dyslipidemia, asthma, osteoarthritis, anemia, and end-stage renal disease on peritoneal dialysis.  Patient presents to the emergency room complaining of shortness of breath and increased edema.  Patient has been admitted for Shortness of breath [R06.02] Fluid overload [E87.70] Symptomatic anemia [D64.9]  [facility] PD  End stage renal disease on peritoneal dialysis. No missed outpatient treatments. Decreased urination may have lead to fluid overload, insufficient dialysis. Will dialyze patient tonight with a 4.25% and 2.5% dialysate to optimize fluid removal.   -Patient has had 2 CT scans, both with contrast, back in March and May.  We feel this may have greatly decreased her residual renal function.  Continue IV furosemide 80 mg 3 times daily.   -Appreciate vascular surgery placing right chest PermCath on 01/30/2023. - Patient received initial dialysis treatment after placement, tolerated well - Patient receiving second treatment today, UF 1.5 L as tolerated. - Patient will receive third dialysis treatment on Thursday. - Patient will need outpatient dialysis clinic. Renal navigator notified   2. Anemia of chronic kidney disease  Lab Results  Component Value Date   HGB 6.9 (L) 01/31/2023    Hgb 6.9, blood transfusions have been given during this admission.  Patient  does have current suspicion of renal carcinoma, will defer ESA's due to this.  Patient reports no signs of bleeding.  Primary team to consult GI  3. Secondary Hyperparathyroidism: with outpatient labs: none  Lab Results  Component Value Date   CALCIUM 8.4 (L) 01/31/2023   CAION 1.26 10/07/2021  Calcium within desired range.  4.  Hypertension with chronic kidney disease.  Home regimen includes amlodipine, carvedilol, clonidine, hydralazine, losartan, and metoprolol.   Amlodipine, hydralazine, and metoprolol currently held.  Blood pressure 154/74 during dialysis.   LOS: 7 Glendel Jaggers 12/24/202412:55 PM

## 2023-02-01 DIAGNOSIS — N185 Chronic kidney disease, stage 5: Secondary | ICD-10-CM

## 2023-02-01 DIAGNOSIS — D649 Anemia, unspecified: Secondary | ICD-10-CM | POA: Diagnosis not present

## 2023-02-01 DIAGNOSIS — E8779 Other fluid overload: Secondary | ICD-10-CM | POA: Diagnosis not present

## 2023-02-01 DIAGNOSIS — D631 Anemia in chronic kidney disease: Secondary | ICD-10-CM

## 2023-02-01 DIAGNOSIS — E8770 Fluid overload, unspecified: Secondary | ICD-10-CM | POA: Diagnosis not present

## 2023-02-01 LAB — CBC
HCT: 25.6 % — ABNORMAL LOW (ref 36.0–46.0)
Hemoglobin: 8.5 g/dL — ABNORMAL LOW (ref 12.0–15.0)
MCH: 29.5 pg (ref 26.0–34.0)
MCHC: 33.2 g/dL (ref 30.0–36.0)
MCV: 88.9 fL (ref 80.0–100.0)
Platelets: 278 10*3/uL (ref 150–400)
RBC: 2.88 MIL/uL — ABNORMAL LOW (ref 3.87–5.11)
RDW: 14.5 % (ref 11.5–15.5)
WBC: 14.1 10*3/uL — ABNORMAL HIGH (ref 4.0–10.5)
nRBC: 0 % (ref 0.0–0.2)

## 2023-02-01 LAB — TYPE AND SCREEN
ABO/RH(D): O POS
Antibody Screen: POSITIVE
Unit division: 0
Unit division: 0

## 2023-02-01 LAB — BASIC METABOLIC PANEL
Anion gap: 12 (ref 5–15)
BUN: 35 mg/dL — ABNORMAL HIGH (ref 8–23)
CO2: 28 mmol/L (ref 22–32)
Calcium: 8.9 mg/dL (ref 8.9–10.3)
Chloride: 93 mmol/L — ABNORMAL LOW (ref 98–111)
Creatinine, Ser: 6.49 mg/dL — ABNORMAL HIGH (ref 0.44–1.00)
GFR, Estimated: 7 mL/min — ABNORMAL LOW (ref >60)
Glucose, Bld: 107 mg/dL — ABNORMAL HIGH (ref 70–99)
Potassium: 3.6 mmol/L (ref 3.5–5.1)
Sodium: 133 mmol/L — ABNORMAL LOW (ref 135–145)

## 2023-02-01 LAB — BPAM RBC
Blood Product Expiration Date: 202501252359
Blood Product Expiration Date: 202501242359
ISSUE DATE / TIME: 202412241249
ISSUE DATE / TIME: 202412221249
Unit Type and Rh: 5100
Unit Type and Rh: 5100

## 2023-02-01 LAB — GLUCOSE, CAPILLARY
Glucose-Capillary: 129 mg/dL — ABNORMAL HIGH (ref 70–99)
Glucose-Capillary: 150 mg/dL — ABNORMAL HIGH (ref 70–99)
Glucose-Capillary: 72 mg/dL (ref 70–99)
Glucose-Capillary: 164 mg/dL — ABNORMAL HIGH (ref 70–99)

## 2023-02-01 LAB — MAGNESIUM: Magnesium: 2 mg/dL (ref 1.7–2.4)

## 2023-02-01 LAB — PREPARE RBC (CROSSMATCH)

## 2023-02-01 MED ORDER — INSULIN ASPART 100 UNIT/ML IJ SOLN
10.0000 [IU] | Freq: Three times a day (TID) | INTRAMUSCULAR | Status: DC
Start: 2023-02-02 — End: 2023-02-04
  Administered 2023-02-02 – 2023-02-03 (×3): 10 [IU] via SUBCUTANEOUS
  Filled 2023-02-01 (×2): qty 1

## 2023-02-01 MED ORDER — FUROSEMIDE 40 MG PO TABS
80.0000 mg | ORAL_TABLET | Freq: Every day | ORAL | Status: DC
  Administered 2023-02-02 – 2023-02-04 (×3): 80 mg via ORAL
  Filled 2023-02-01 (×3): qty 2

## 2023-02-01 NOTE — Consult Note (Signed)
Hematology/Oncology Consult note [facility] Telephone:([phone] [phone]  Patient Care Team: Rayetta Humphrey, MD as PCP - General (Family Medicine) Rayetta Humphrey, MD (Family Medicine) Earna Coder, MD as Consulting Physician (Oncology)   Name of the patient: Heidi [name]  [id number]  09-09-1955    Reason for consult: Anemia   Requesting physician: Dr. Fran Lowes  Date of visit:02/01/2023    History of presenting illness- Patient is a 67 year old female with a past medical history significant for hypertension hyperlipidemia GERD osteoarthritis and history of end-stage renal disease on peritoneal dialysis presenting to the ER on 01/24/2023 with symptoms of worsening shortness of breath and lower extremity edema.  Her baseline hemoglobin since January 2023 has been fluctuating between 7-8.  Last outpatient hemoglobin on 10/13/2022 was 8.4 on admission to the hospital on 01/23/2023 her hemoglobin was 7.1 and she has received multiple units of PRBC transfusion and her hemoglobin has remained around 7.  Today it is 8.5.  Ferritin levels on 01/24/2023 were elevated at 1379 with normal iron studies and an iron saturation of 28% B12 level normal at 338 Free light chain both kappa and lambda were elevated with a normal free light chain ratio results of SPEP are still not back, folate normal at 10.1  ECOG PS- 3  Pain scale- 0   Review of systems- Review of Systems  Constitutional:  Positive for malaise/fatigue. Negative for chills, fever and weight loss.  HENT:  Negative for congestion, ear discharge and nosebleeds.   Eyes:  Negative for blurred vision.  Respiratory:  Negative for cough, hemoptysis, sputum production, shortness of breath and wheezing.   Cardiovascular:  Negative for chest pain, palpitations, orthopnea and claudication.  Gastrointestinal:  Negative for abdominal pain, blood in stool, constipation, diarrhea, heartburn, melena, nausea  and vomiting.  Genitourinary:  Negative for dysuria, flank pain, frequency, hematuria and urgency.  Musculoskeletal:  Negative for back pain, joint pain and myalgias.  Skin:  Negative for rash.  Neurological:  Negative for dizziness, tingling, focal weakness, seizures, weakness and headaches.  Endo/Heme/Allergies:  Does not bruise/bleed easily.  Psychiatric/Behavioral:  Negative for depression and suicidal ideas. The patient does not have insomnia.     Allergies  Allergen Reactions   Sulfa Antibiotics Nausea And Vomiting   Misc. Sulfonamide Containing Compounds Nausea And Vomiting    Patient Active Problem List   Diagnosis Date Noted   Anemia 01/31/2023   Fluid overload 01/24/2023   Acute on chronic anemia 01/24/2023   Essential hypertension 01/24/2023   Type 2 diabetes mellitus with chronic kidney disease (HCC) 01/24/2023   Dyslipidemia 01/24/2023   Sepsis due to gram-negative UTI (HCC) 01/24/2023   Peritoneal dialysis catheter in situ (HCC) 11/16/2022   Unspecified chronic bronchitis (HCC) 11/16/2022   Symptomatic anemia 05/17/2022   Dependence on renal dialysis (HCC) 05/17/2022   Hypertensive chronic kidney disease with stage 5 chronic kidney disease or end stage renal disease (HCC) 05/17/2022   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 (HCC) 05/17/2022   Long term (current) use of insulin (HCC) 05/17/2022   Preop cardiovascular exam 03/08/2022   Red blood cell antibody positive 02/15/2022   Insomnia 12/02/2021   Vitamin D insufficiency 12/02/2021   ESRD needing dialysis (HCC)    Ventral hernia without obstruction or gangrene    Hyponatremia 09/20/2021   Open displaced fracture of body of right talus 03/10/2021   Type I or II open bimalleolar fracture of right ankle 03/10/2021   Fracture of manubrium, initial  encounter for closed fracture 02/18/2021   Hyperglycemia 02/18/2021   Morbid obesity (HCC) 01/12/2019   Obesity (BMI 35.0-39.9 without comorbidity)  01/12/2019   Lab test positive for detection of COVID-19 virus 01/12/2019   Tricompartment degenerative joint disease of knee 08/18/2017   Secondary hyperparathyroidism of renal origin (HCC) 07/29/2015   Breast mass in female 09/05/2014   Arthritis of sacroiliac joint (HCC) 06/10/2014   Facet arthritis of lumbar region 06/10/2014   Degenerative arthritis of lumbar spine 06/10/2014   Elevated parathyroid hormone 03/21/2014   Stage 3 chronic kidney disease (HCC) 01/19/2014   Atopic dermatitis 07/03/2013   Proteinuria 04/02/2013   Genu valgus, 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   Type 2 diabetes mellitus with stage 3a chronic kidney disease, with long-term current use of insulin (HCC) 03/15/2012   Hyperlipidemia 03/15/2012   Benign essential hypertension 03/15/2012   Allergic rhinitis 03/15/2012   Pes planus 03/15/2012   Trochanteric bursitis of both hips 03/15/2012     Past Medical History:  Diagnosis Date   Allergic rhinitis    Arthritis    sacroiliac joint   Asthma    Breast mass in female 09/05/2014   Degenerative arthritis of lumbar spine    Diabetes mellitus without complication (HCC)    Genu valgus, congenital    GERD (gastroesophageal reflux disease)    Hyperlipidemia    Hypertension    Hyponatremia    Microalbuminuria    Pes planus    Renal disorder    CKD stage 3   Tricompartment degenerative joint disease of knee    Trochanteric bursitis of both hips    Volvulus (HCC)      Past Surgical History:  Procedure Laterality Date   ABDOMINAL HYSTERECTOMY     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   BREAST BIOPSY Right 1996   benign   BREAST BIOPSY Right 07/13/2022   stereo biopsy/ x clip/ path pending   BREAST BIOPSY Right 07/13/2022   MM RT BREAST BX W LOC DEV 1ST LESION IMAGE BX SPEC STEREO GUIDE 07/13/2022 [facility]   BREAST EXCISIONAL BIOPSY Right 2016   broken ankle surgery Right 02/2021   CAPD INSERTION N/A 10/07/2021   Procedure: LAPAROSCOPIC INSERTION  CONTINUOUS AMBULATORY PERITONEAL DIALYSIS  (CAPD) CATHETER;  Surgeon: Leafy Ro, MD;  Location: [facility] ORS;  Service: General;  Laterality: N/A;   COLONOSCOPY     COLONOSCOPY N/A 12/19/2019   Procedure: COLONOSCOPY;  Surgeon: Toledo, Boykin Nearing, MD;  Location: [facility]OSCOPY;  Service: Gastroenterology;  Laterality: N/A;   DIALYSIS/PERMA CATHETER INSERTION N/A 01/30/2023   Procedure: DIALYSIS/PERMA CATHETER INSERTION;  Surgeon: Annice Needy, MD;  Location: [facility] INVASIVE [facility];  Service: Cardiovascular;  Laterality: N/A;   EXPLORATORY LAPAROTOMY     of colon for torsion   INSERTION OF MESH  10/07/2021   Procedure: INSERTION OF MESH;  Surgeon: Leafy Ro, MD;  Location: [facility] ORS;  Service: General;;   NASAL FRACTURE SURGERY     UMBILICAL HERNIA REPAIR N/A 10/07/2021   Procedure: HERNIA REPAIR UMBILICAL ADULT;  Surgeon: Leafy Ro, MD;  Location: [facility] ORS;  Service: General;  Laterality: N/A;    Social History   Socioeconomic History   Marital status: Single    Spouse name: Not on file   Number of children: Not on file   Years of education: Not on file   Highest education level: Not on file  Occupational History   Not on file  Tobacco Use   Smoking status: Never  Smokeless tobacco: Never  Vaping Use   Vaping status: Never Used  Substance and Sexual Activity   Alcohol use: Yes    Comment: rarely   Drug use: No   Sexual activity: Not on file  Other Topics Concern   Not on file  Social History Narrative   Not on file   Social Drivers of Health   Financial Resource Strain: Low Risk  (10/11/2022)   Received from [facility]   Overall Financial Resource Strain (CARDIA)    Difficulty of Paying Living Expenses: Not hard at all  Food Insecurity: No Food Insecurity (01/24/2023)   Hunger Vital Sign    Worried About Running Out of Food in the Last Year: Never true    Ran Out of Food in the Last Year: Never true  Transportation Needs: No Transportation  Needs (01/24/2023)   PRAPARE - Administrator, Civil Service (Medical): No    Lack of Transportation (Non-Medical): No  Physical Activity: Inactive (03/10/2021)   Received from [facility], [facility]   Exercise Vital Sign    Days of Exercise per Week: 0 days    Minutes of Exercise per Session: 0 min  Stress: No Stress Concern Present (03/10/2021)   Received from [facility], [facility]   [employer] of Occupational Health - Occupational Stress Questionnaire    Feeling of Stress : Only a little  Social Connections: Not on file  Intimate Partner Violence: Not At Risk (01/24/2023)   Humiliation, Afraid, Rape, and Kick questionnaire    Fear of Current or Ex-Partner: No    Emotionally Abused: No    Physically Abused: No    Sexually Abused: No     Family History  Problem Relation Age of Onset   Diabetes Mother    Diabetes Father    Cancer Father    Breast cancer Maternal Aunt      Current Facility-Administered Medications:    0.9 %  sodium chloride infusion (Manually program via Guardrails IV Fluids), , Intravenous, Once, Darlin Priestly, MD, Held at 01/31/23 1559   acetaminophen (TYLENOL) tablet 650 mg, 650 mg, Oral, Q6H PRN, 650 mg at 01/31/23 0418 **OR** acetaminophen (TYLENOL) suppository 650 mg, 650 mg, Rectal, Q6H PRN, Annice Needy, MD   albuterol (PROVENTIL) (2.5 MG/3ML) 0.083% nebulizer solution 2.5 mg, 2.5 mg, Inhalation, Q2H PRN, Annice Needy, MD, 2.5 mg at 01/28/23 2156   alteplase (CATHFLO ACTIVASE) injection 2 mg, 2 mg, Intracatheter, Once PRN, Annice Needy, MD   alum & mag hydroxide-simeth (MAALOX/MYLANTA) 200-200-20 MG/5ML suspension 30 mL, 30 mL, Oral, Q4H PRN, Wyn Quaker, Marlow Baars, MD   amLODipine (NORVASC) tablet 10 mg, 10 mg, Oral, Daily, Darlin Priestly, MD, 10 mg at 02/01/23 2841   calcitRIOL (ROCALTROL) capsule 0.25 mcg, 0.25 mcg, Oral, Daily, Dew, Marlow Baars, MD, 0.25 mcg at 02/01/23 0932    carvedilol (COREG) tablet 12.5 mg, 12.5 mg, Oral, BID WC, Dew, Marlow Baars, MD, 12.5 mg at 02/01/23 0930   Chlorhexidine Gluconate Cloth 2 % PADS 6 each, 6 each, Topical, Q0600, Annice Needy, MD, 6 each at 02/01/23 3244   cloNIDine (CATAPRES) tablet 0.1 mg, 0.1 mg, Oral, BID, Annice Needy, MD, 0.1 mg at 02/01/23 0932   cyanocobalamin (VITAMIN B12) tablet 1,000 mcg, 1,000 mcg, Oral, Daily, Annice Needy, MD, 1,000 mcg at 02/01/23 0930   dialysis solution 2.5% low-MG/low-CA dianeal solution, , Intraperitoneal, Q24H, Dew, Marlow Baars, MD,  6,000 mL at 01/29/23 2000   dialysis solution 4.25% low-MG/low-CA dianeal solution, , Intraperitoneal, Q24H, Dew, Marlow Baars, MD, New Bag at 01/28/23 2006   feeding supplement (NEPRO CARB STEADY) liquid 237 mL, 237 mL, Oral, BID BM, Dew, Marlow Baars, MD, 237 mL at 02/01/23 0935   fluticasone (FLONASE) 50 MCG/ACT nasal spray 2 spray, 2 spray, Each Nare, Daily, Dew, Marlow Baars, MD, 2 spray at 01/30/23 0846   folic acid (FOLVITE) tablet 1 mg, 1 mg, Oral, Daily, Dew, Marlow Baars, MD, 1 mg at 02/01/23 0931   furosemide (LASIX) injection 80 mg, 80 mg, Intravenous, TID, Annice Needy, MD, 80 mg at 02/01/23 0932   gentamicin cream (GARAMYCIN) 0.1 % 1 Application, 1 Application, Topical, Daily, Dew, Marlow Baars, MD, 1 Application at 01/30/23 0847   heparin injection 1,000 Units, 1,000 Units, Intracatheter, PRN, Dew, Marlow Baars, MD   insulin aspart (novoLOG) injection 0-5 Units, 0-5 Units, Subcutaneous, QHS, Annice Needy, MD, 2 Units at 01/29/23 2118   insulin aspart (novoLOG) injection 0-6 Units, 0-6 Units, Subcutaneous, TID WC, Annice Needy, MD, 3 Units at 01/30/23 0845   insulin aspart (novoLOG) injection 15 Units, 15 Units, Subcutaneous, TID WC, Annice Needy, MD, 15 Units at 02/01/23 1208   insulin glargine-yfgn (SEMGLEE) injection 15 Units, 15 Units, Subcutaneous, Daily, Annice Needy, MD, 15 Units at 02/01/23 3086   losartan (COZAAR) tablet 100 mg, 100 mg, Oral, Daily, Annice Needy, MD, 100 mg at  02/01/23 0931   ondansetron (ZOFRAN-ODT) disintegrating tablet 4 mg, 4 mg, Oral, Q8H PRN, Wyn Quaker, Marlow Baars, MD   pravastatin (PRAVACHOL) tablet 20 mg, 20 mg, Oral, q1800, Annice Needy, MD, 20 mg at 01/31/23 1749   sodium chloride flush (NS) 0.9 % injection 10 mL, 10 mL, Intravenous, Q12H, Dew, Marlow Baars, MD, 10 mL at 02/01/23 0935   traZODone (DESYREL) tablet 25 mg, 25 mg, Oral, QHS PRN, Annice Needy, MD, 25 mg at 01/31/23 0030   Vitamin D (Ergocalciferol) (DRISDOL) 1.25 MG (50000 UNIT) capsule 50,000 Units, 50,000 Units, Oral, Q7 days, Annice Needy, MD, 50,000 Units at 01/28/23 0912   Physical exam:  Vitals:   01/31/23 1940 01/31/23 2041 02/01/23 0305 02/01/23 0830  BP: (!) 155/71 (!) 155/71 127/84 136/79  Pulse: 85  (!) 101 98  Resp: 16  18 17   Temp: 99.1 F (37.3 C)  99.3 F (37.4 C) 99.1 F (37.3 C)  TempSrc: Oral  Oral Oral  SpO2: 97%  96% 99%  Weight:      Height:       Physical Exam Cardiovascular:     Rate and Rhythm: Normal rate and regular rhythm.     Heart sounds: Normal heart sounds.  Pulmonary:     Effort: Pulmonary effort is normal.     Breath sounds: Normal breath sounds.  Abdominal:     General: Bowel sounds are normal.     Palpations: Abdomen is soft.  Musculoskeletal:     Cervical back: Normal range of motion.     Right lower leg: Edema present.     Left lower leg: Edema present.  Skin:    General: Skin is warm and dry.  Neurological:     Mental Status: She is alert and oriented to person, place, and time.           Latest Ref Rng & Units 02/01/2023    5:10 AM  CMP  Glucose 70 - 99 mg/dL 578   BUN 8 -  23 mg/dL 35   Creatinine 1.61 - 1.00 mg/dL 0.96   Sodium 045 - 409 mmol/L 133   Potassium 3.5 - 5.1 mmol/L 3.6   Chloride 98 - 111 mmol/L 93   CO2 22 - 32 mmol/L 28   Calcium 8.9 - 10.3 mg/dL 8.9       Latest Ref Rng & Units 02/01/2023    5:10 AM  CBC  WBC 4.0 - 10.5 K/uL 14.1   Hemoglobin 12.0 - 15.0 g/dL 8.5   Hematocrit 81.1 - 46.0 % 25.6    Platelets 150 - 400 K/uL 278     @IMAGES @  PERIPHERAL VASCULAR CATHETERIZATION Result Date: 01/30/2023 See surgical note for result.  ECHOCARDIOGRAM COMPLETE Result Date: 01/25/2023    ECHOCARDIOGRAM REPORT   Patient Name:   Heidi [name] Date of Exam: 01/25/2023 Medical Rec #:  [id number]       Height:       64.0 in Accession #:    [id number]      Weight:       211.0 lb Date of Birth:  1955/02/14        BSA:          2.001 m Patient Age:    67 years        BP:           144/61 mmHg Patient Gender: F               HR:           74 bpm. Exam Location:  [facility] Procedure: 2D Echo, Cardiac Doppler and Color Doppler Indications:     Dyspnea R06.00  History:         Patient has no prior history of Echocardiogram examinations.                  Risk Factors:Diabetes, Hypertension and Dyslipidemia.  Sonographer:     Cristela Blue Referring Phys:  [id number] TINA LAI Diagnosing Phys: Mellody Drown Alluri IMPRESSIONS  1. Left ventricular ejection fraction, by estimation, is 60 to 65%. The left ventricle has normal function. The left ventricle has no regional wall motion abnormalities. There is moderate left ventricular hypertrophy. Left ventricular diastolic parameters are consistent with Grade II diastolic dysfunction (pseudonormalization).  2. Right ventricular systolic function is normal. The right ventricular size is normal.  3. Left atrial size was mildly dilated.  4. Moderate pericardial effusion. The pericardial effusion is circumferential. There is no evidence of cardiac tamponade. Moderate pleural effusion in the left lateral region.  5. The mitral valve is normal in structure. Mild mitral valve regurgitation. Severe mitral annular calcification.  6. The aortic valve is tricuspid. Aortic valve regurgitation is not visualized. FINDINGS  Left Ventricle: Left ventricular ejection fraction, by estimation, is 60 to 65%. The left ventricle has normal function. The left ventricle has no regional wall motion abnormalities. The  left ventricular internal cavity size was normal in size. There is  moderate left ventricular hypertrophy. Left ventricular diastolic parameters are consistent with Grade II diastolic dysfunction (pseudonormalization). Right Ventricle: The right ventricular size is normal. No increase in right ventricular wall thickness. Right ventricular systolic function is normal. Left Atrium: Left atrial size was mildly dilated. Right Atrium: Right atrial size was normal in size. Pericardium: A moderately sized pericardial effusion is present. The pericardial effusion is circumferential. There is no evidence of cardiac tamponade. Mitral Valve: The mitral valve is normal in structure. Severe mitral annular calcification. Mild mitral valve regurgitation. MV peak  gradient, 12.4 mmHg. The mean mitral valve gradient is 5.0 mmHg. Tricuspid Valve: The tricuspid valve is normal in structure. Tricuspid valve regurgitation is mild. Aortic Valve: The aortic valve is tricuspid. Aortic valve regurgitation is not visualized. Aortic valve mean gradient measures 3.0 mmHg. Aortic valve peak gradient measures 5.8 mmHg. Aortic valve area, by VTI measures 3.87 cm. Pulmonic Valve: The pulmonic valve was not well visualized. Pulmonic valve regurgitation is mild. Aorta: The aortic root is normal in size and structure. IAS/Shunts: The interatrial septum was not assessed. Additional Comments: There is a moderate pleural effusion in the left lateral region.  LEFT VENTRICLE PLAX 2D LVIDd:         3.80 cm   Diastology LVIDs:         2.50 cm   LV e' medial:    5.87 cm/s LV PW:         1.40 cm   LV E/e' medial:  19.3 LV IVS:        1.40 cm   LV e' lateral:   5.98 cm/s LVOT diam:     2.00 cm   LV E/e' lateral: 18.9 LV SV:         90 LV SV Index:   45 LVOT Area:     3.14 cm  RIGHT VENTRICLE RV Basal diam:  4.10 cm RV Mid diam:    4.20 cm RV S prime:     9.68 cm/s TAPSE (M-mode): 2.4 cm LEFT ATRIUM             Index        RIGHT ATRIUM           Index LA  diam:        4.40 cm 2.20 cm/m   RA Area:     14.30 cm LA Vol (A2C):   64.1 ml 32.03 ml/m  RA Volume:   31.20 ml  15.59 ml/m LA Vol (A4C):   72.2 ml 36.07 ml/m LA Biplane Vol: 68.4 ml 34.18 ml/m  AORTIC VALVE AV Area (Vmax):    2.92 cm AV Area (Vmean):   2.98 cm AV Area (VTI):     3.87 cm AV Vmax:           120.50 cm/s AV Vmean:          83.700 cm/s AV VTI:            0.234 m AV Peak Grad:      5.8 mmHg AV Mean Grad:      3.0 mmHg LVOT Vmax:         112.00 cm/s LVOT Vmean:        79.400 cm/s LVOT VTI:          0.288 m LVOT/AV VTI ratio: 1.23  AORTA Ao Root diam: 2.70 cm MITRAL VALVE                TRICUSPID VALVE MV Area (PHT): 2.55 cm     TR Peak grad:   21.3 mmHg MV Area VTI:   1.52 cm     TR Vmax:        231.00 cm/s MV Peak grad:  12.4 mmHg MV Mean grad:  5.0 mmHg     SHUNTS MV Vmax:       1.76 m/s     Systemic VTI:  0.29 m MV Vmean:      97.3 cm/s    Systemic Diam: 2.00 cm MV Decel Time: 298 msec MV E velocity: 113.00  cm/s MV A velocity: 147.00 cm/s MV E/A ratio:  0.77 Windell Norfolk Electronically signed by Windell Norfolk Signature Date/Time: 01/25/2023/4:37:13 PM    Final    DG Chest 2 View Result Date: 01/23/2023 CLINICAL DATA:  Shortness of breath.  Intermittent chest pain. EXAM: CHEST - 2 VIEW COMPARISON:  Chest radiograph dated January 13, 2023. FINDINGS: The heart is enlarged. The mediastinal contours are within normal limits. Aortic atherosclerosis. Small left-greater-than-right pleural effusions basilar atelectasis. Mild bilateral bronchial thickening. No pneumothorax. No acute osseous abnormality. IMPRESSION: Cardiomegaly with pulmonary edema and new small left-greater-than-right pleural effusions. Electronically Signed   By: Hart Robinsons M.D.   On: 01/23/2023 15:22   DG Chest 2 View Result Date: 01/23/2023 CLINICAL DATA:  67 year old female with cough EXAM: CHEST - 2 VIEW COMPARISON:  02/03/2020 FINDINGS: Cardiomediastinal silhouette unchanged in size and contour. No evidence of  central vascular congestion. No interlobular septal thickening. Bronchial wall thickening and vague reticulonodular opacities in the lower lungs. No pneumothorax or pleural effusion. No acute displaced fracture. Degenerative changes of the spine. IMPRESSION: Mild bronchial thickening and reticulonodular opacities, potentially bronchitis or sequela of other atypical infection. Negative for lobar pneumonia. Electronically Signed   By: Gilmer Mor D.O.   On: 01/23/2023 09:06    Assessment and plan- Patient is a 67 y.o. female with history of end-stage renal disease on peritoneal dialysis admitted for fluid overload and symptomatic anemia  On looking back at her prior cbc, patient has had a hemoglobin of 7-8 for the last 1 year even dating back to January 2023.  Most recently in September 2022 her hemoglobin was 8.4.  Iron studies done prior to transfusion did not indicate iron deficiency.  B12 was normal and the most recent folate level is normal.  Serum free light chain ratio is normal and myeloma panel is pending.  I will do some additional anemia workup including haptoglobin LDH reticulocyte count TSH.  Based on the trends in her prior counts this appears to be more consistent with anemia of chronic disease and chronic kidney disease I do not see an indication to perform any additional investigation such as a bone marrow biopsy inpatient. Her white cell count and platelets are normal. Yield of bone marrow biopsy will likely be low.    I will follow her up as an outpatient.  It appears that patient was not started on erythrocyte stimulating agents given her history of renal cysts which could be seen in the setting of renal cell carcinoma.  She has an indeterminate right adrenal lesion which has remained stable and was also evaluated by oncology at [facility].  No surgical management was recommended at that time.  Since patient is becoming transfusion dependent I think it would be worthwhile to pursue ESA's as an  outpatient to maintain her quality of life while keeping a close eye on her renal cysts.  Renal cell carcinoma can grow with ESA, given her severe anemia I think benefits would outweigh potential risks at this point.  Patient is receiving outpatient hemodialysis this can be given along with dialysis instead of coming to the cancer center.  There is not much that can be done from hematology standpoint to improve her anemia. I will discuss her case with DR. Lateef before any decision is made to pursue ESA's as well. I have discussed all this in detail with patient who would also like to think about pros and cons of pursuing ESAs before proceeding.   Visit Diagnosis 1. Shortness of  breath   2. Symptomatic anemia     Dr. Owens Shark, MD, MPH [facility] at [name] [phone] 02/01/2023

## 2023-02-01 NOTE — Progress Notes (Signed)
Heidi Minium, MD [facility]   [address], [address] [place], [place] [address] Phone: [phone] Fax : [phone]   Subjective: Patient was seen today and sitting up in bed eating a full meal.  She is denying any sign of any GI bleeding or dark stools.  The patient was seen by hematology and also agrees that this appears to be anemia of chronic disease and not a GI bleed.  Patient denies any abdominal pain nausea vomiting fevers or chills.   Objective: Vital signs in last 24 hours: Vitals:   01/31/23 1940 01/31/23 2041 02/01/23 0305 02/01/23 0830  BP: (!) 155/71 (!) 155/71 127/84 136/79  Pulse: 85  (!) 101 98  Resp: 16  18 17   Temp: 99.1 F (37.3 C)  99.3 F (37.4 C) 99.1 F (37.3 C)  TempSrc: Oral  Oral Oral  SpO2: 97%  96% 99%  Weight:      Height:       Weight change: -3 kg  Intake/Output Summary (Last 24 hours) at 02/01/2023 1416 Last data filed at 02/01/2023 1209 Gross per 24 hour  Intake 240 ml  Output 1700 ml  Net -1460 ml     Exam: Heart:: Regular rate and rhythm, S1S2 present, or without murmur or extra heart sounds Lungs: normal and clear to auscultation and percussion Abdomen: soft, nontender, normal bowel sounds   Lab Results: @LABTEST2 @ Micro Results: Recent Results (from the past 240 hours)  SARS Coronavirus 2 by RT PCR (hospital order, performed in [facility] hospital lab) *cepheid single result test* Anterior Nasal Swab     Status: None   Collection Time: 01/23/23  1:08 PM   Specimen: Anterior Nasal Swab  Result Value Ref Range Status   SARS Coronavirus 2 by RT PCR NEGATIVE NEGATIVE Final    Comment: (NOTE) SARS-CoV-2 target nucleic acids are NOT DETECTED.  The SARS-CoV-2 RNA is generally detectable in upper and lower respiratory specimens during the acute phase of infection. The lowest concentration of SARS-CoV-2 viral copies this assay can detect is 250 copies / mL. A negative result does not preclude SARS-CoV-2 infection and should  not be used as the sole basis for treatment or other patient management decisions.  A negative result may occur with improper specimen collection / handling, submission of specimen other than nasopharyngeal swab, presence of viral mutation(s) within the areas targeted by this assay, and inadequate number of viral copies (<250 copies / mL). A negative result must be combined with clinical observations, patient history, and epidemiological information.  Fact Sheet for Patients:   RoadLapTop.co.za  Fact Sheet for Healthcare Providers: http://kim-miller.com/  This test is not yet approved or  cleared by the [place] FDA and has been authorized for detection and/or diagnosis of SARS-CoV-2 by FDA under an Emergency Use Authorization (EUA).  This EUA will remain in effect (meaning this test can be used) for the duration of the COVID-19 declaration under Section 564(b)(1) of the Act, 21 U.S.C. section 360bbb-3(b)(1), unless the authorization is terminated or revoked sooner.  Performed at [facility], [address], [place], [place] [address]   Urine Culture (for pregnant, neutropenic or urologic patients or patients with an indwelling urinary catheter)     Status: Abnormal   Collection Time: 01/24/23  2:02 AM   Specimen: Urine, Catheterized  Result Value Ref Range Status   Specimen Description   Final    URINE, CATHETERIZED Performed at [facility] [address] [place], [place] [address]  Special Requests   Final    NONE Performed at [facility], [address] [place], [place] [address]    Culture (A)  Final    <10,000 COLONIES/mL INSIGNIFICANT GROWTH Performed at [facility], [address] [place], [place] [address]    Report Status 01/25/2023 FINAL  Final  Culture, blood (x 2)     Status: None   Collection Time: 01/24/23  5:10 AM   Specimen: BLOOD  Result Value Ref Range Status    Specimen Description BLOOD LEFT ASSIST CONTROL  Final   Special Requests   Final    BOTTLES DRAWN AEROBIC AND ANAEROBIC Blood Culture results may not be optimal due to an inadequate volume of blood received in culture bottles   Culture   Final    NO GROWTH 5 DAYS Performed at [facility], [address] [place], [place] [address]    Report Status 01/29/2023 FINAL  Final  Culture, blood (x 2)     Status: None   Collection Time: 01/24/23  5:10 AM   Specimen: BLOOD  Result Value Ref Range Status   Specimen Description BLOOD LEFT HAND  Final   Special Requests   Final    BOTTLES DRAWN AEROBIC AND ANAEROBIC Blood Culture results may not be optimal due to an inadequate volume of blood received in culture bottles   Culture   Final    NO GROWTH 5 DAYS Performed at [facility], [name] [place], [place] [address]    Report Status 01/29/2023 FINAL  Final   Studies/Results: No results found. Medications: I have reviewed the patient's current medications. Scheduled Meds:  sodium chloride   Intravenous Once   amLODipine  10 mg Oral Daily   calcitRIOL  0.25 mcg Oral Daily   carvedilol  12.5 mg Oral BID WC   Chlorhexidine Gluconate Cloth  6 each Topical Q0600   cloNIDine  0.1 mg Oral BID   vitamin B-12  1,000 mcg Oral Daily   feeding supplement (NEPRO CARB STEADY)  237 mL Oral BID BM   fluticasone  2 spray Each Nare Daily   folic acid  1 mg Oral Daily   furosemide  80 mg Intravenous TID   gentamicin cream  1 Application Topical Daily   insulin aspart  0-5 Units Subcutaneous QHS   insulin aspart  0-6 Units Subcutaneous TID WC   insulin aspart  15 Units Subcutaneous TID WC   insulin glargine-yfgn  15 Units Subcutaneous Daily   losartan  100 mg Oral Daily   pravastatin  20 mg Oral q1800   sodium chloride flush  10 mL Intravenous Q12H   Vitamin D (Ergocalciferol)  50,000 Units Oral Q7 days   Continuous Infusions:  dialysis solution 2.5% low-MG/low-CA      dialysis solution 4.25% low-MG/low-CA     PRN Meds:.acetaminophen **OR** acetaminophen, albuterol, alteplase, alum & mag hydroxide-simeth, heparin, ondansetron, traZODone   Assessment: Principal Problem:   Fluid overload Active Problems:   Acute on chronic anemia   Essential hypertension   Type 2 diabetes mellitus with chronic kidney disease (HCC)   Dyslipidemia   Sepsis due to gram-negative UTI (HCC)   Anemia    Plan: This patient has anemia of chronic disease without any sign of GI blood loss or report of any GI bleeding.  The patient has a haptoglobin ordered by me this morning and also has other lab sent off by hematology to evaluate for other causes of anemia.  There is nothing I am planning to do for this patient in regards to any endoscopic procedures at this time.  I will sign off.  Please call if any further GI concerns or questions.  We would like to thank you for the opportunity to participate in the care of Heidi [name].    LOS: 8 days   Sherlyn Hay 02/01/2023, 2:16 PM Pager [phone] 7am-5pm  Check AMION for 5pm -7am coverage and on weekends

## 2023-02-01 NOTE — Plan of Care (Signed)
  Problem: Fluid Volume: Goal: Hemodynamic stability will improve Outcome: Progressing   Problem: Clinical Measurements: Goal: Signs and symptoms of infection will decrease Outcome: Progressing   Problem: Clinical Measurements: Goal: Ability to maintain clinical measurements within normal limits will improve Outcome: Progressing Goal: Will remain free from infection Outcome: Progressing Goal: Diagnostic test results will improve Outcome: Progressing Goal: Respiratory complications will improve Outcome: Progressing Goal: Cardiovascular complication will be avoided Outcome: Progressing   Problem: Activity: Goal: Risk for activity intolerance will decrease Outcome: Progressing   Problem: Nutrition: Goal: Adequate nutrition will be maintained Outcome: Progressing   Problem: Coping: Goal: Level of anxiety will decrease Outcome: Progressing   Problem: Elimination: Goal: Will not experience complications related to bowel motility Outcome: Progressing Goal: Will not experience complications related to urinary retention Outcome: Progressing   Problem: Pain Management: Goal: General experience of comfort will improve Outcome: Progressing   Problem: Safety: Goal: Ability to remain free from injury will improve Outcome: Progressing   Problem: Skin Integrity: Goal: Risk for impaired skin integrity will decrease Outcome: Progressing   Problem: Coping: Goal: Ability to adjust to condition or change in health will improve Outcome: Progressing   Problem: Health Behavior/Discharge Planning: Goal: Ability to manage health-related needs will improve Outcome: Progressing   Problem: Metabolic: Goal: Ability to maintain appropriate glucose levels will improve Outcome: Progressing   Problem: Nutritional: Goal: Maintenance of adequate nutrition will improve Outcome: Progressing   Problem: Skin Integrity: Goal: Risk for impaired skin integrity will decrease Outcome:  Progressing   Problem: Tissue Perfusion: Goal: Adequacy of tissue perfusion will improve Outcome: Progressing

## 2023-02-01 NOTE — Plan of Care (Signed)
  Problem: Fluid Volume: Goal: Hemodynamic stability will improve Outcome: Progressing   Problem: Clinical Measurements: Goal: Diagnostic test results will improve Outcome: Progressing   Problem: Respiratory: Goal: Ability to maintain adequate ventilation will improve Outcome: Progressing   Problem: Clinical Measurements: Goal: Ability to maintain clinical measurements within normal limits will improve Outcome: Progressing Goal: Will remain free from infection Outcome: Progressing Goal: Diagnostic test results will improve Outcome: Progressing Goal: Respiratory complications will improve Outcome: Progressing Goal: Cardiovascular complication will be avoided Outcome: Progressing

## 2023-02-01 NOTE — Progress Notes (Signed)
[facility]  ROUNDING NOTE   Subjective:   Heidi [name] is a 67 y.o. female with past medical conditions including GERD, hypertension, dyslipidemia, asthma, osteoarthritis, anemia, and end-stage renal disease on peritoneal dialysis.  Patient presents to the emergency room complaining of shortness of breath and increased edema.  Patient has been admitted for Shortness of breath [R06.02] Fluid overload [E87.70] Symptomatic anemia [D64.9]  Patient is known to our practice and is followed by Dr Cherylann Ratel at [facility].   Update: Patient underwent hemodialysis treatment yesterday. We are planning for hemodialysis treatment again tomorrow.  Objective:  Vital signs in last 24 hours:  Temp:  [99.1 F (37.3 C)-99.3 F (37.4 C)] 99.1 F (37.3 C) (12/25 0830) Pulse Rate:  [85-101] 98 (12/25 0830) Resp:  [16-18] 17 (12/25 0830) BP: (127-155)/(71-84) 136/79 (12/25 0830) SpO2:  [96 %-99 %] 99 % (12/25 0830)  Weight change: -3 kg Filed Weights   01/31/23 0442 01/31/23 1134 01/31/23 1436  Weight: 93.7 kg 96.4 kg 95.1 kg    Intake/Output: I/O last 3 completed shifts: In: 795 [P.O.:480; Blood:315] Out: 1601 [Urine:101; Other:1500]   Intake/Output this shift:  Total I/O In: -  Out: 100 [Urine:100]  Physical Exam: General: NAD  Head: Normocephalic, atraumatic. Moist oral mucosal membranes  Eyes: Anicteric  Lungs:  Clear bilateral, normal effort  Heart: Regular rate and rhythm  Abdomen:  Soft, nontender, PDC  Extremities: 2+ peripheral edema.  Neurologic: Alert, moving all four extremities  Skin: No lesions  Access: PD catheter, right IJ PermCath    Basic Metabolic Panel: Recent Labs  Lab 01/27/23 0417 01/28/23 0531 01/29/23 0304 01/30/23 0426 01/31/23 0330 02/01/23 0510  NA 133* 134* 132* 135 133* 133*  K 3.7 3.6 3.5 3.5 3.4* 3.6  CL 93* 94* 93* 91* 95* 93*  CO2 24 25 25 28 27 28   GLUCOSE 397* 362* 335* 297* 105* 107*  BUN 62* 54* 57* 52* 41* 35*   CREATININE 10.45* 9.93* 10.26* 9.94* 8.16* 6.49*  CALCIUM 8.0* 8.4* 8.5* 8.8* 8.4* 8.9  MG 2.0 2.1 2.2  --  2.0 2.0    Liver Function Tests: No results for input(s): "AST", "ALT", "ALKPHOS", "BILITOT", "PROT", "ALBUMIN" in the last 168 hours.  No results for input(s): "LIPASE", "AMYLASE" in the last 168 hours. No results for input(s): "AMMONIA" in the last 168 hours.  CBC: Recent Labs  Lab 01/29/23 2055 01/30/23 0426 01/31/23 0330 01/31/23 1141 02/01/23 0510  WBC 15.4* 15.0* 13.7* 14.5* 14.1*  HGB 7.0* 7.1* 6.5* 6.9* 8.5*  HCT 22.3* 22.2* 19.8* 22.0* 25.6*  MCV 92.5 93.3 91.2 94.8 88.9  PLT 275 297 264 276 278    Cardiac Enzymes: No results for input(s): "CKTOTAL", "CKMB", "CKMBINDEX", "TROPONINI" in the last 168 hours.  BNP: Invalid input(s): "POCBNP"  CBG: Recent Labs  Lab 01/31/23 0744 01/31/23 1606 01/31/23 2020 02/01/23 0917 02/01/23 1137  GLUCAP 93 78 171* 129* 150*    Microbiology: Results for orders placed or performed during the hospital encounter of 01/23/23  SARS Coronavirus 2 by RT PCR (hospital order, performed in [facility] hospital lab) *cepheid single result test* Anterior Nasal Swab     Status: None   Collection Time: 01/23/23  1:08 PM   Specimen: Anterior Nasal Swab  Result Value Ref Range Status   SARS Coronavirus 2 by RT PCR NEGATIVE NEGATIVE Final    Comment: (NOTE) SARS-CoV-2 target nucleic acids are NOT DETECTED.  The SARS-CoV-2 RNA is generally detectable in upper and lower respiratory  specimens during the acute phase of infection. The lowest concentration of SARS-CoV-2 viral copies this assay can detect is 250 copies / mL. A negative result does not preclude SARS-CoV-2 infection and should not be used as the sole basis for treatment or other patient management decisions.  A negative result may occur with improper specimen collection / handling, submission of specimen other than nasopharyngeal swab, presence of viral mutation(s)  within the areas targeted by this assay, and inadequate number of viral copies (<250 copies / mL). A negative result must be combined with clinical observations, patient history, and epidemiological information.  Fact Sheet for Patients:   RoadLapTop.co.za  Fact Sheet for Healthcare Providers: http://kim-miller.com/  This test is not yet approved or  cleared by the [place] FDA and has been authorized for detection and/or diagnosis of SARS-CoV-2 by FDA under an Emergency Use Authorization (EUA).  This EUA will remain in effect (meaning this test can be used) for the duration of the COVID-19 declaration under Section 564(b)(1) of the Act, 21 U.S.C. section 360bbb-3(b)(1), unless the authorization is terminated or revoked sooner.  Performed at [facility], [address] [place], [place] [address]   Urine Culture (for pregnant, neutropenic or urologic patients or patients with an indwelling urinary catheter)     Status: Abnormal   Collection Time: 01/24/23  2:02 AM   Specimen: Urine, Catheterized  Result Value Ref Range Status   Specimen Description   Final    URINE, CATHETERIZED Performed at [facility], [address], [place], [place] [address]    Special Requests   Final    NONE Performed at [facility], [address] [place], [place] [address]    Culture (A)  Final    <10,000 COLONIES/mL INSIGNIFICANT GROWTH Performed at [facility], [address], [place], [place] [address]    Report Status 01/25/2023 FINAL  Final  Culture, blood (x 2)     Status: None   Collection Time: 01/24/23  5:10 AM   Specimen: BLOOD  Result Value Ref Range Status   Specimen Description BLOOD LEFT ASSIST CONTROL  Final   Special Requests   Final    BOTTLES DRAWN AEROBIC AND ANAEROBIC Blood Culture results may not be optimal due to an inadequate volume of blood received in culture bottles   Culture    Final    NO GROWTH 5 DAYS Performed at [facility], [address] [place], [place] [address]    Report Status 01/29/2023 FINAL  Final  Culture, blood (x 2)     Status: None   Collection Time: 01/24/23  5:10 AM   Specimen: BLOOD  Result Value Ref Range Status   Specimen Description BLOOD LEFT HAND  Final   Special Requests   Final    BOTTLES DRAWN AEROBIC AND ANAEROBIC Blood Culture results may not be optimal due to an inadequate volume of blood received in culture bottles   Culture   Final    NO GROWTH 5 DAYS Performed at [facility], [address], [place], [place] [address]    Report Status 01/29/2023 FINAL  Final    Coagulation Studies: No results for input(s): "LABPROT", "INR" in the last 72 hours.   Urinalysis: No results for input(s): "COLORURINE", "LABSPEC", "PHURINE", "GLUCOSEU", "HGBUR", "BILIRUBINUR", "KETONESUR", "PROTEINUR", "UROBILINOGEN", "NITRITE", "LEUKOCYTESUR" in the last 72 hours.  Invalid input(s): "APPERANCEUR"     Imaging: No results found.     Medications:    dialysis solution  2.5% low-MG/low-CA     dialysis solution 4.25% low-MG/low-CA      sodium chloride   Intravenous Once   amLODipine  10 mg Oral Daily   calcitRIOL  0.25 mcg Oral Daily   carvedilol  12.5 mg Oral BID WC   Chlorhexidine Gluconate Cloth  6 each Topical Q0600   cloNIDine  0.1 mg Oral BID   vitamin B-12  1,000 mcg Oral Daily   feeding supplement (NEPRO CARB STEADY)  237 mL Oral BID BM   fluticasone  2 spray Each Nare Daily   folic acid  1 mg Oral Daily   furosemide  80 mg Intravenous TID   gentamicin cream  1 Application Topical Daily   insulin aspart  0-5 Units Subcutaneous QHS   insulin aspart  0-6 Units Subcutaneous TID WC   insulin aspart  15 Units Subcutaneous TID WC   insulin glargine-yfgn  15 Units Subcutaneous Daily   losartan  100 mg Oral Daily   pravastatin  20 mg Oral q1800   sodium chloride flush  10 mL Intravenous Q12H   Vitamin D  (Ergocalciferol)  50,000 Units Oral Q7 days   acetaminophen **OR** acetaminophen, albuterol, alteplase, alum & mag hydroxide-simeth, heparin, ondansetron, traZODone  Assessment/ Plan:  Ms. Heidi [name] is a 67 y.o.  female  with past medical conditions including GERD, hypertension, dyslipidemia, asthma, osteoarthritis, anemia, and end-stage renal disease on peritoneal dialysis.  Patient presents to the emergency room complaining of shortness of breath and increased edema.  Patient has been admitted for Shortness of breath [R06.02] Fluid overload [E87.70] Symptomatic anemia [D64.9]  [facility] PD  End stage renal disease on peritoneal dialysis. No missed outpatient treatments. Decreased urination may have lead to fluid overload, insufficient dialysis. Will dialyze patient tonight with a 4.25% and 2.5% dialysate to optimize fluid removal.   -Patient has had 2 CT scans, both with contrast, back in March and May.  We feel this may have greatly decreased her residual renal function.  -Appreciate vascular surgery placing right chest PermCath on 01/30/2023. - Patient received second dialysis treatment yesterday.  We will plan for third treatment on Thursday.  Reduce Lasix to 80 mg daily.   2. Anemia of chronic kidney disease  Lab Results  Component Value Date   HGB 8.5 (L) 02/01/2023    Hemoglobin improved to 8.5 posttransfusion.  Continue to monitor CBC.  3. Secondary Hyperparathyroidism: with outpatient labs: none  Lab Results  Component Value Date   CALCIUM 8.9 02/01/2023   CAION 1.26 10/07/2021  Calcium within desired range.  4.  Hypertension with chronic kidney disease.  Home regimen includes amlodipine, carvedilol, clonidine, hydralazine, losartan, and metoprolol.  Patient currently on amlodipine, carvedilol, clonidine, losartan.  Blood pressure well-controlled at 136/79      LOS: 8 Kate Sweetman 12/25/20243:48 PM

## 2023-02-01 NOTE — Progress Notes (Signed)
PROGRESS NOTE    Heidi [name]  ZOX:[id number] DOB: 1955/09/13 DOA: 01/23/2023 PCP: Rayetta Humphrey, MD  [id number]  LOS: 8 days   Brief hospital course:   Assessment & Plan: Heidi [name] is a 67 y.o. female with medical history significant for asthma, osteoarthritis, GERD, hypertension, dyslipidemia, stage III CKD, who presented to the emergency room with acute onset of dyspnea with associated orthopnea and worsening lower extremity edema.    She Is admitted for fluid overload due to insufficient PD at home. She had PD while inpatient with no improvement of her symptoms. Nephrology plan to initiate HD next week. She is weak and short of breath, has constipation. Her Hb is dropping, possibly due to hemodilution and ESRD. Her sugars are very high. Insulin regimen adjusted. She is pending HD access tomorrow.   * Fluid overload  Acute pulmonary edema ESRD on peritoneal dialysis. Fluid overload likely due to decreased urination and/or insufficient PD at home. Echo shows EF 60%, grade 2 diastolic dysfunction. She has trouble breathing even with rest. --started on IV lasix 80 mg TID, albumin per nephrology team. --dialysis cath placed, start HD on 12/23 Plan: --reduce IV lasix to 80 mg daily --iHD per nephro   Acute on chronic anemia Folate def No active bleeding.  S/p 3u pRBC  Per nephro can't get erythropoietin due to suspicion of RCC. --GI consulted, did not think anemia is due to GI bleed.  Nephro did not think anemia of chronic disease would cause this rapid of Hgb drop. Continue to monitor Hgb and transfuse to keep Hgb >7 Continue folic acid and vit B12 supplement. --hematology consult   Sepsis, ruled out UTI, ruled out Urine Cultures show insignificant growth.   No antibiotics needed. WBC 14.3, possibly reactive Continue to trend.    Type 2 diabetes mellitus with chronic kidney disease (HCC) With Hyperglycemia A1c 6.1 but blood sugars very high. Low A1c due to  severe anemia. --cont glargine 15u daily --reduce mealtime to 10u TID --ACHS and SSI   Essential hypertension Continue coreg, clonidine, losartan --cont amlodipine --hold hydralazine   Dyslipidemia Continue statin therapy.   Hypokalemia --correct with HD   DVT prophylaxis: SCD/Compression stockings Code Status: Full code  Family Communication:  Level of care: Med-Surg Dispo:   The patient is from: home Anticipated d/c is to: home Anticipated d/c date is: 2-3 days   Subjective and Interval History:  Pt reported reported dyspnea when lying flat.  BM x4.   Objective: Vitals:   01/31/23 2041 02/01/23 0305 02/01/23 0830 02/01/23 1559  BP: (!) 155/71 127/84 136/79 139/70  Pulse:  (!) 101 98 73  Resp:  18 17 16   Temp:  99.3 F (37.4 C) 99.1 F (37.3 C) 98.6 F (37 C)  TempSrc:  Oral Oral Oral  SpO2:  96% 99% 98%  Weight:      Height:        Intake/Output Summary (Last 24 hours) at 02/01/2023 1730 Last data filed at 02/01/2023 1209 Gross per 24 hour  Intake 240 ml  Output 200 ml  Net 40 ml   Filed Weights   01/31/23 0442 01/31/23 1134 01/31/23 1436  Weight: 93.7 kg 96.4 kg 95.1 kg    Examination:   Constitutional: NAD, AAOx3 HEENT: conjunctivae and lids normal, EOMI CV: No cyanosis.   RESP: normal respiratory effort at rest Neuro: II - XII grossly intact.   Psych: Normal mood and affect.  Appropriate judgement and reason   Data Reviewed: I  have personally reviewed labs and imaging studies  Time spent: 35 minutes  Darlin Priestly, MD [facility] If 7PM-7AM, please contact night-coverage 02/01/2023, 5:30 PM

## 2023-02-02 DIAGNOSIS — E8779 Other fluid overload: Secondary | ICD-10-CM | POA: Diagnosis not present

## 2023-02-02 DIAGNOSIS — D631 Anemia in chronic kidney disease: Secondary | ICD-10-CM

## 2023-02-02 LAB — BASIC METABOLIC PANEL
Anion gap: 13 (ref 5–15)
BUN: 44 mg/dL — ABNORMAL HIGH (ref 8–23)
CO2: 25 mmol/L (ref 22–32)
Calcium: 8.6 mg/dL — ABNORMAL LOW (ref 8.9–10.3)
Chloride: 89 mmol/L — ABNORMAL LOW (ref 98–111)
Creatinine, Ser: 7.75 mg/dL — ABNORMAL HIGH (ref 0.44–1.00)
GFR, Estimated: 5 mL/min — ABNORMAL LOW (ref >60)
Glucose, Bld: 134 mg/dL — ABNORMAL HIGH (ref 70–99)
Potassium: 3.7 mmol/L (ref 3.5–5.1)
Sodium: 127 mmol/L — ABNORMAL LOW (ref 135–145)

## 2023-02-02 LAB — CBC
HCT: 25.9 % — ABNORMAL LOW (ref 36.0–46.0)
Hemoglobin: 8.6 g/dL — ABNORMAL LOW (ref 12.0–15.0)
MCH: 30.4 pg (ref 26.0–34.0)
MCHC: 33.2 g/dL (ref 30.0–36.0)
MCV: 91.5 fL (ref 80.0–100.0)
Platelets: 281 10*3/uL (ref 150–400)
RBC: 2.83 MIL/uL — ABNORMAL LOW (ref 3.87–5.11)
RDW: 14 % (ref 11.5–15.5)
WBC: 12.7 10*3/uL — ABNORMAL HIGH (ref 4.0–10.5)
nRBC: 0 % (ref 0.0–0.2)

## 2023-02-02 LAB — GLUCOSE, CAPILLARY
Glucose-Capillary: 123 mg/dL — ABNORMAL HIGH (ref 70–99)
Glucose-Capillary: 157 mg/dL — ABNORMAL HIGH (ref 70–99)
Glucose-Capillary: 145 mg/dL — ABNORMAL HIGH (ref 70–99)
Glucose-Capillary: 130 mg/dL — ABNORMAL HIGH (ref 70–99)
Glucose-Capillary: 131 mg/dL — ABNORMAL HIGH (ref 70–99)

## 2023-02-02 LAB — RETICULOCYTES
Immature Retic Fract: 20.8 % — ABNORMAL HIGH (ref 2.3–15.9)
RBC.: 2.87 MIL/uL — ABNORMAL LOW (ref 3.87–5.11)
Retic Count, Absolute: 63.4 10*3/uL (ref 19.0–186.0)
Retic Ct Pct: 2.2 % (ref 0.4–3.1)

## 2023-02-02 LAB — HAPTOGLOBIN: Haptoglobin: 416 mg/dL — ABNORMAL HIGH (ref 37–355)

## 2023-02-02 LAB — MAGNESIUM: Magnesium: 2.1 mg/dL (ref 1.7–2.4)

## 2023-02-02 LAB — LACTATE DEHYDROGENASE: LDH: 192 U/L (ref 98–192)

## 2023-02-02 MED ORDER — HEPARIN SODIUM (PORCINE) 1000 UNIT/ML IJ SOLN
INTRAMUSCULAR | Status: AC
  Filled 2023-02-02: qty 10

## 2023-02-02 NOTE — Progress Notes (Signed)
PROGRESS NOTE    Heidi [name]  ZOX:[id number] DOB: [birth year] DOA: 01/23/2023 PCP: Heidi Humphrey, MD  [id number]  LOS: 9 days   Brief hospital course:   Assessment & Plan: Heidi [name] is a 67 y.o. female with medical history significant for asthma, osteoarthritis, GERD, hypertension, dyslipidemia, stage III CKD, who presented to the emergency room with acute onset of dyspnea with associated orthopnea and worsening lower extremity edema.    She Is admitted for fluid overload due to insufficient PD at home. She had PD while inpatient with no improvement of her symptoms. Nephrology plan to initiate HD next week. She is weak and short of breath, has constipation. Her Hb is dropping, possibly due to hemodilution and ESRD. Her sugars are very high. Insulin regimen adjusted. She is pending HD access tomorrow.   * Fluid overload  Acute pulmonary edema ESRD on peritoneal dialysis. Fluid overload likely due to decreased urination and/or insufficient PD at home. Echo shows EF 60%, grade 2 diastolic dysfunction. She has trouble breathing even with rest. --started on IV lasix 80 mg TID, albumin per nephrology team. --dialysis cath placed, start HD on 12/23 Plan: --cont oral lasix 80 mg daily --iHD per nephro   Acute on chronic anemia of CKD Folate def No active bleeding.  S/p 3u pRBC  Per nephro, pt was not receiving erythropoietin due to risk of RCC. --GI consulted, did not think anemia is due to GI bleed.   --Hematology consulted, favor anemia of CKD. Continue folic acid and vit B12 supplement. --rec Epo, per hematology   Sepsis, ruled out UTI, ruled out Urine Cultures show insignificant growth.   No antibiotics needed. WBC 14.3, possibly reactive Continue to trend.    Type 2 diabetes mellitus with chronic kidney disease (HCC) With Hyperglycemia A1c 6.1 but blood sugars very high. Low A1c due to severe anemia. --cont glargine 15u daily --mealtime 10u TID --ACHS and  SSI   Essential hypertension Continue coreg, clonidine, losartan and amlodipine --hold hydralazine   Dyslipidemia Continue statin therapy.   Hypokalemia --correct with HD   DVT prophylaxis: SCD/Compression stockings Code Status: Full code  Family Communication:  Level of care: Med-Surg Dispo:   The patient is from: home Anticipated d/c is to: home Anticipated d/c date is: whenever outpatient dialysis set up   Subjective and Interval History:  Pt reported feeling better.   Objective: Vitals:   02/02/23 1100 02/02/23 1130 02/02/23 1153 02/02/23 1727  BP: (!) 157/70 (!) 152/65 (!) 153/67 (!) 147/67  Pulse: 69 78 79 79  Resp: 17 19 18 16   Temp:   98.7 F (37.1 C)   TempSrc:   Oral   SpO2: 97% 98% 97% 100%  Weight:   95.3 kg   Height:        Intake/Output Summary (Last 24 hours) at 02/02/2023 1934 Last data filed at 02/02/2023 1153 Gross per 24 hour  Intake 240 ml  Output 1501 ml  Net -1261 ml   Filed Weights   02/02/23 0500 02/02/23 0749 02/02/23 1153  Weight: 95.2 kg 96.8 kg 95.3 kg    Examination:   Constitutional: NAD, AAOx3 HEENT: conjunctivae and lids normal, EOMI CV: No cyanosis.   RESP: normal respiratory effort, on RA Neuro: II - XII grossly intact.   Psych: Normal mood and affect.  Appropriate judgement and reason   Data Reviewed: I have personally reviewed labs and imaging studies  Time spent: 35 minutes  Darlin Priestly, MD [facility] If 7PM-7AM, please  contact night-coverage 02/02/2023, 7:34 PM

## 2023-02-02 NOTE — Progress Notes (Signed)
[facility]  ROUNDING NOTE   Subjective:   Heidi [name] is a 67 y.o. female with past medical conditions including GERD, hypertension, dyslipidemia, asthma, osteoarthritis, anemia, and end-stage renal disease on peritoneal dialysis.  Patient presents to the emergency room complaining of shortness of breath and increased edema.  Patient has been admitted for Shortness of breath [R06.02] Fluid overload [E87.70] Symptomatic anemia [D64.9]  Patient is known to our practice and is followed by Dr Cherylann Ratel at [facility].   Update: Patient seen and evaluated earlier during dialysis   HEMODIALYSIS FLOWSHEET:  Blood Flow Rate (mL/min): 0 mL/min Arterial Pressure (mmHg): 35.96 mmHg Venous Pressure (mmHg): -23.84 mmHg TMP (mmHg): 15.75 mmHg Ultrafiltration Rate (mL/min): 682 mL/min Dialysate Flow Rate (mL/min): 300 ml/min  Appetite appropriate Remains on room air Voices remaining concerns of edema   Objective:  Vital signs in last 24 hours:  Temp:  [98.6 F (37 C)-98.9 F (37.2 C)] 98.7 F (37.1 C) (12/26 1153) Pulse Rate:  [68-80] 79 (12/26 1153) Resp:  [14-22] 18 (12/26 1153) BP: (132-168)/(61-76) 153/67 (12/26 1153) SpO2:  [94 %-98 %] 97 % (12/26 1153) Weight:  [95.2 kg-96.8 kg] 95.3 kg (12/26 1153)  Weight change: -1.2 kg Filed Weights   02/02/23 0500 02/02/23 0749 02/02/23 1153  Weight: 95.2 kg 96.8 kg 95.3 kg    Intake/Output: I/O last 3 completed shifts: In: 480 [P.O.:480] Out: 201 [Urine:201]   Intake/Output this shift:  Total I/O In: -  Out: 1500 [Other:1500]  Physical Exam: General: NAD  Head: Normocephalic, atraumatic. Moist oral mucosal membranes  Eyes: Anicteric  Lungs:  Clear bilateral, normal effort  Heart: Regular rate and rhythm  Abdomen:  Soft, nontender, PDC  Extremities: 2+ peripheral edema.  Neurologic: Alert, moving all four extremities  Skin: No lesions  Access: PD catheter, right IJ PermCath    Basic Metabolic  Panel: Recent Labs  Lab 01/28/23 0531 01/29/23 0304 01/30/23 0426 01/31/23 0330 02/01/23 0510 02/02/23 0605  NA 134* 132* 135 133* 133* 127*  K 3.6 3.5 3.5 3.4* 3.6 3.7  CL 94* 93* 91* 95* 93* 89*  CO2 25 25 28 27 28 25   GLUCOSE 362* 335* 297* 105* 107* 134*  BUN 54* 57* 52* 41* 35* 44*  CREATININE 9.93* 10.26* 9.94* 8.16* 6.49* 7.75*  CALCIUM 8.4* 8.5* 8.8* 8.4* 8.9 8.6*  MG 2.1 2.2  --  2.0 2.0 2.1    Liver Function Tests: No results for input(s): "AST", "ALT", "ALKPHOS", "BILITOT", "PROT", "ALBUMIN" in the last 168 hours.  No results for input(s): "LIPASE", "AMYLASE" in the last 168 hours. No results for input(s): "AMMONIA" in the last 168 hours.  CBC: Recent Labs  Lab 01/30/23 0426 01/31/23 0330 01/31/23 1141 02/01/23 0510 02/02/23 0605  WBC 15.0* 13.7* 14.5* 14.1* 12.7*  HGB 7.1* 6.5* 6.9* 8.5* 8.6*  HCT 22.2* 19.8* 22.0* 25.6* 25.9*  MCV 93.3 91.2 94.8 88.9 91.5  PLT 297 264 276 278 281    Cardiac Enzymes: No results for input(s): "CKTOTAL", "CKMB", "CKMBINDEX", "TROPONINI" in the last 168 hours.  BNP: Invalid input(s): "POCBNP"  CBG: Recent Labs  Lab 02/01/23 1137 02/01/23 1601 02/01/23 2130 02/02/23 0740 02/02/23 1357  GLUCAP 150* 72 164* 123* 157*    Microbiology: Results for orders placed or performed during the hospital encounter of 01/23/23  SARS Coronavirus 2 by RT PCR (hospital order, performed in [facility] hospital lab) *cepheid single result test* Anterior Nasal Swab     Status: None   Collection Time: 01/23/23  1:08 PM   Specimen: Anterior Nasal Swab  Result Value Ref Range Status   SARS Coronavirus 2 by RT PCR NEGATIVE NEGATIVE Final    Comment: (NOTE) SARS-CoV-2 target nucleic acids are NOT DETECTED.  The SARS-CoV-2 RNA is generally detectable in upper and lower respiratory specimens during the acute phase of infection. The lowest concentration of SARS-CoV-2 viral copies this assay can detect is 250 copies / mL. A negative  result does not preclude SARS-CoV-2 infection and should not be used as the sole basis for treatment or other patient management decisions.  A negative result may occur with improper specimen collection / handling, submission of specimen other than nasopharyngeal swab, presence of viral mutation(s) within the areas targeted by this assay, and inadequate number of viral copies (<250 copies / mL). A negative result must be combined with clinical observations, patient history, and epidemiological information.  Fact Sheet for Patients:   RoadLapTop.co.za  Fact Sheet for Healthcare Providers: http://kim-miller.com/  This test is not yet approved or  cleared by the [place] FDA and has been authorized for detection and/or diagnosis of SARS-CoV-2 by FDA under an Emergency Use Authorization (EUA).  This EUA will remain in effect (meaning this test can be used) for the duration of the COVID-19 declaration under Section 564(b)(1) of the Act, 21 U.S.C. section 360bbb-3(b)(1), unless the authorization is terminated or revoked sooner.  Performed at [facility], [address] [place], [place] [address]   Urine Culture (for pregnant, neutropenic or urologic patients or patients with an indwelling urinary catheter)     Status: Abnormal   Collection Time: 01/24/23  2:02 AM   Specimen: Urine, Catheterized  Result Value Ref Range Status   Specimen Description   Final    URINE, CATHETERIZED Performed at [facility], [address] [place], [place] [address]    Special Requests   Final    NONE Performed at [facility], [address], [place], [place] [address]    Culture (A)  Final    <10,000 COLONIES/mL INSIGNIFICANT GROWTH Performed at [facility], [address] [place], [place] [address]    Report Status 01/25/2023 FINAL  Final  Culture, blood (x 2)     Status: None   Collection Time: 01/24/23   5:10 AM   Specimen: BLOOD  Result Value Ref Range Status   Specimen Description BLOOD LEFT ASSIST CONTROL  Final   Special Requests   Final    BOTTLES DRAWN AEROBIC AND ANAEROBIC Blood Culture results may not be optimal due to an inadequate volume of blood received in culture bottles   Culture   Final    NO GROWTH 5 DAYS Performed at [facility], [address] [place], [place] [address]    Report Status 01/29/2023 FINAL  Final  Culture, blood (x 2)     Status: None   Collection Time: 01/24/23  5:10 AM   Specimen: BLOOD  Result Value Ref Range Status   Specimen Description BLOOD LEFT HAND  Final   Special Requests   Final    BOTTLES DRAWN AEROBIC AND ANAEROBIC Blood Culture results may not be optimal due to an inadequate volume of blood received in culture bottles   Culture   Final    NO GROWTH 5 DAYS Performed at [facility], [address], [place], [place] [address]    Report Status 01/29/2023 FINAL  Final    Coagulation Studies: No results for input(s): "LABPROT", "INR" in  the last 72 hours.   Urinalysis: No results for input(s): "COLORURINE", "LABSPEC", "PHURINE", "GLUCOSEU", "HGBUR", "BILIRUBINUR", "KETONESUR", "PROTEINUR", "UROBILINOGEN", "NITRITE", "LEUKOCYTESUR" in the last 72 hours.  Invalid input(s): "APPERANCEUR"     Imaging: No results found.     Medications:    dialysis solution 2.5% low-MG/low-CA     dialysis solution 4.25% low-MG/low-CA      sodium chloride   Intravenous Once   amLODipine  10 mg Oral Daily   calcitRIOL  0.25 mcg Oral Daily   carvedilol  12.5 mg Oral BID WC   Chlorhexidine Gluconate Cloth  6 each Topical Q0600   cloNIDine  0.1 mg Oral BID   vitamin B-12  1,000 mcg Oral Daily   feeding supplement (NEPRO CARB STEADY)  237 mL Oral BID BM   fluticasone  2 spray Each Nare Daily   folic acid  1 mg Oral Daily   furosemide  80 mg Oral Daily   gentamicin cream  1 Application Topical Daily   insulin aspart  0-5  Units Subcutaneous QHS   insulin aspart  0-6 Units Subcutaneous TID WC   insulin aspart  10 Units Subcutaneous TID WC   insulin glargine-yfgn  15 Units Subcutaneous Daily   losartan  100 mg Oral Daily   pravastatin  20 mg Oral q1800   sodium chloride flush  10 mL Intravenous Q12H   Vitamin D (Ergocalciferol)  50,000 Units Oral Q7 days   acetaminophen **OR** acetaminophen, albuterol, alteplase, alum & mag hydroxide-simeth, heparin, ondansetron, traZODone  Assessment/ Plan:  Ms. MARITSSA SCATURRO is a 68 y.o.  female  with past medical conditions including GERD, hypertension, dyslipidemia, asthma, osteoarthritis, anemia, and end-stage renal disease on peritoneal dialysis.  Patient presents to the emergency room complaining of shortness of breath and increased edema.  Patient has been admitted for Shortness of breath [R06.02] Fluid overload [E87.70] Symptomatic anemia [D64.9]  [facility] PD  End stage renal disease on peritoneal dialysis. No missed outpatient treatments. Decreased urination may have lead to fluid overload, insufficient dialysis. Will dialyze patient tonight with a 4.25% and 2.5% dialysate to optimize fluid removal.   -Patient has had 2 CT scans, both with contrast, back in March and May.  We feel this may have greatly decreased her residual renal function.  -Appreciate vascular surgery placing right chest PermCath on 01/30/2023. -Patient received third dialysis treatment today, UF 1.5 L achieved. - Patient will receive UF only treatment tomorrow for additional fluid removal. -Renal navigator currently seeking outpatient dialysis clinic.   2. Anemia of chronic kidney disease  Lab Results  Component Value Date   HGB 8.6 (L) 02/02/2023    Hemoglobin improved to 8.6. Continue to monitor CBC.  3. Secondary Hyperparathyroidism: with outpatient labs: none  Lab Results  Component Value Date   CALCIUM 8.6 (L) 02/02/2023   CAION 1.26 10/07/2021  Will continue to monitor bone  minerals during this admission.  4.  Hypertension with chronic kidney disease.  Home regimen includes amlodipine, carvedilol, clonidine, hydralazine, losartan, and metoprolol.  Patient currently on amlodipine, carvedilol, clonidine, losartan.  Blood pressure remained stable.  Continue current regimen.      LOS: 9 Joyell Emami 12/26/20242:56 PM

## 2023-02-02 NOTE — Progress Notes (Addendum)
Hemodialysis Note:  Received patient in bed to unit. Alert and oriented. Informed consent singed and in chart.  Treatment initiated: 0812 Treatment completed: 1153  Patient tolerated well. Transported back to room, alert without acute distress. Report given to patient's RN.  Total UF removed: 1.5L  Post HD weight:  Ina Kick 95.3kg Kid[facility] Dialysis Unit

## 2023-02-02 NOTE — Plan of Care (Signed)
  Problem: Clinical Measurements: Goal: Diagnostic test results will improve Outcome: Progressing Goal: Signs and symptoms of infection will decrease Outcome: Progressing   Problem: Respiratory: Goal: Ability to maintain adequate ventilation will improve Outcome: Progressing   Problem: Health Behavior/Discharge Planning: Goal: Ability to manage health-related needs will improve Outcome: Progressing   Problem: Clinical Measurements: Goal: Will remain free from infection Outcome: Progressing Goal: Respiratory complications will improve Outcome: Progressing Goal: Cardiovascular complication will be avoided Outcome: Progressing   Problem: Nutrition: Goal: Adequate nutrition will be maintained Outcome: Progressing   Problem: Pain Management: Goal: General experience of comfort will improve Outcome: Progressing   Problem: Safety: Goal: Ability to remain free from injury will improve Outcome: Progressing   Problem: Skin Integrity: Goal: Risk for impaired skin integrity will decrease Outcome: Progressing   Problem: Tissue Perfusion: Goal: Adequacy of tissue perfusion will improve Outcome: Progressing

## 2023-02-03 ENCOUNTER — Other Ambulatory Visit: Payer: Self-pay | Admitting: *Deleted

## 2023-02-03 DIAGNOSIS — D649 Anemia, unspecified: Secondary | ICD-10-CM

## 2023-02-03 DIAGNOSIS — E8779 Other fluid overload: Secondary | ICD-10-CM | POA: Diagnosis not present

## 2023-02-03 LAB — BASIC METABOLIC PANEL
Anion gap: 11 (ref 5–15)
BUN: 32 mg/dL — ABNORMAL HIGH (ref 8–23)
CO2: 28 mmol/L (ref 22–32)
Calcium: 8.8 mg/dL — ABNORMAL LOW (ref 8.9–10.3)
Chloride: 91 mmol/L — ABNORMAL LOW (ref 98–111)
Creatinine, Ser: 5.26 mg/dL — ABNORMAL HIGH (ref 0.44–1.00)
GFR, Estimated: 8 mL/min — ABNORMAL LOW (ref >60)
Glucose, Bld: 111 mg/dL — ABNORMAL HIGH (ref 70–99)
Potassium: 3.6 mmol/L (ref 3.5–5.1)
Sodium: 130 mmol/L — ABNORMAL LOW (ref 135–145)

## 2023-02-03 LAB — CBC
HCT: 25.1 % — ABNORMAL LOW (ref 36.0–46.0)
Hemoglobin: 8.4 g/dL — ABNORMAL LOW (ref 12.0–15.0)
MCH: 29.7 pg (ref 26.0–34.0)
MCHC: 33.5 g/dL (ref 30.0–36.0)
MCV: 88.7 fL (ref 80.0–100.0)
Platelets: 271 10*3/uL (ref 150–400)
RBC: 2.83 MIL/uL — ABNORMAL LOW (ref 3.87–5.11)
RDW: 13.6 % (ref 11.5–15.5)
WBC: 11.7 10*3/uL — ABNORMAL HIGH (ref 4.0–10.5)
nRBC: 0 % (ref 0.0–0.2)

## 2023-02-03 LAB — GLUCOSE, CAPILLARY
Glucose-Capillary: 95 mg/dL (ref 70–99)
Glucose-Capillary: 205 mg/dL — ABNORMAL HIGH (ref 70–99)
Glucose-Capillary: 162 mg/dL — ABNORMAL HIGH (ref 70–99)

## 2023-02-03 LAB — HAPTOGLOBIN: Haptoglobin: 415 mg/dL — ABNORMAL HIGH (ref 37–355)

## 2023-02-03 LAB — MAGNESIUM: Magnesium: 2 mg/dL (ref 1.7–2.4)

## 2023-02-03 MED ORDER — HYDRALAZINE HCL 25 MG PO TABS
25.0000 mg | ORAL_TABLET | Freq: Two times a day (BID) | ORAL | Status: DC
  Administered 2023-02-03 – 2023-02-04 (×2): 25 mg via ORAL
  Filled 2023-02-03 (×2): qty 1

## 2023-02-03 MED ORDER — HEPARIN SODIUM (PORCINE) 1000 UNIT/ML IJ SOLN
INTRAMUSCULAR | Status: AC
  Filled 2023-02-03: qty 10

## 2023-02-03 NOTE — Discharge Planning (Signed)
PLACEMENT RESOLVED Outpatient Facility [facility]  [address] [place], [place] [address] [phone]  Schedule: TTS 5:45am Start Date: Tuesday 12/31 5:30am  Patient is scheduled for a PDC flush on Monday 12/30 at 11:00am at [facility].   Dimas Chyle Dialysis Coordinator II  [facility] Cell: [phone] eFax: [phone] [email or web address]

## 2023-02-03 NOTE — TOC Progression Note (Signed)
Transition of Care [facility]) - Progression Note    Patient Details  Name: TYLEIGH CABO MRN: [id number] Date of Birth: Mar 27, 1955  Transition of [facility] CM/SW Contact  Margarito Liner, LCSW Phone Number: 02/03/2023, 8:15 AM  Clinical Narrative:  CSW continues to follow progress.   Expected Discharge Plan and Services                                               Social Determinants of Health (SDOH) Interventions SDOH Screenings   Food Insecurity: No Food Insecurity (01/24/2023)  Housing: Low Risk  (01/25/2023)  Transportation Needs: No Transportation Needs (01/24/2023)  Utilities: Not At Risk (01/24/2023)  Financial Resource Strain: Low Risk  (10/11/2022)   Received from [facility]  Physical Activity: Inactive (03/10/2021)   Received from [facility], [facility]  Stress: No Stress Concern Present (03/10/2021)   Received from [facility], [facility]  Tobacco Use: Low Risk  (01/25/2023)    Readmission Risk Interventions    01/27/2023    1:16 PM  Readmission Risk Prevention Plan  Transportation Screening Complete  Medication Review (RN CM) Complete

## 2023-02-03 NOTE — Plan of Care (Signed)
  Problem: Fluid Volume: Goal: Hemodynamic stability will improve Outcome: Progressing   Problem: Clinical Measurements: Goal: Diagnostic test results will improve Outcome: Progressing Goal: Signs and symptoms of infection will decrease Outcome: Progressing   Problem: Respiratory: Goal: Ability to maintain adequate ventilation will improve Outcome: Progressing   Problem: Education: Goal: Knowledge of General Education information will improve Description: Including pain rating scale, medication(s)/side effects and non-pharmacologic comfort measures Outcome: Progressing   Problem: Health Behavior/Discharge Planning: Goal: Ability to manage health-related needs will improve Outcome: Progressing   Problem: Clinical Measurements: Goal: Ability to maintain clinical measurements within normal limits will improve Outcome: Progressing Goal: Will remain free from infection Outcome: Progressing Goal: Diagnostic test results will improve Outcome: Progressing Goal: Respiratory complications will improve Outcome: Progressing Goal: Cardiovascular complication will be avoided Outcome: Progressing   Problem: Activity: Goal: Risk for activity intolerance will decrease Outcome: Progressing   Problem: Nutrition: Goal: Adequate nutrition will be maintained Outcome: Progressing   Problem: Coping: Goal: Level of anxiety will decrease Outcome: Progressing   Problem: Elimination: Goal: Will not experience complications related to bowel motility Outcome: Progressing Goal: Will not experience complications related to urinary retention Outcome: Progressing   Problem: Pain Management: Goal: General experience of comfort will improve Outcome: Progressing   Problem: Safety: Goal: Ability to remain free from injury will improve Outcome: Progressing   Problem: Skin Integrity: Goal: Risk for impaired skin integrity will decrease Outcome: Progressing   Problem: Education: Goal:  Ability to describe self-care measures that may prevent or decrease complications (Diabetes Survival Skills Education) will improve Outcome: Progressing Goal: Individualized Educational Video(s) Outcome: Progressing   Problem: Coping: Goal: Ability to adjust to condition or change in health will improve Outcome: Progressing   Problem: Fluid Volume: Goal: Ability to maintain a balanced intake and output will improve Outcome: Progressing   Problem: Health Behavior/Discharge Planning: Goal: Ability to identify and utilize available resources and services will improve Outcome: Progressing Goal: Ability to manage health-related needs will improve Outcome: Progressing   Problem: Metabolic: Goal: Ability to maintain appropriate glucose levels will improve Outcome: Progressing   Problem: Nutritional: Goal: Maintenance of adequate nutrition will improve Outcome: Progressing Goal: Progress toward achieving an optimal weight will improve Outcome: Progressing   Problem: Skin Integrity: Goal: Risk for impaired skin integrity will decrease Outcome: Progressing   Problem: Tissue Perfusion: Goal: Adequacy of tissue perfusion will improve Outcome: Progressing

## 2023-02-03 NOTE — Progress Notes (Signed)
[facility]  ROUNDING NOTE   Subjective:   Heidi [name] is a 67 y.o. female with past medical conditions including GERD, hypertension, dyslipidemia, asthma, osteoarthritis, anemia, and end-stage renal disease on peritoneal dialysis.  Patient presents to the emergency room complaining of shortness of breath and increased edema.  Patient has been admitted for Shortness of breath [R06.02] Fluid overload [E87.70] Symptomatic anemia [D64.9]  Patient is known to our practice and is followed by Dr Cherylann Ratel at [facility].   Update: Patient seen and evaluated earlier during dialysis   HEMODIALYSIS FLOWSHEET:  Blood Flow Rate (mL/min): 399 mL/min Arterial Pressure (mmHg): -147.27 mmHg Venous Pressure (mmHg): 153.12 mmHg TMP (mmHg): -14.14 mmHg Ultrafiltration Rate (mL/min): 736 mL/min Dialysate Flow Rate (mL/min): 49 ml/min  Patient receiving UF only treatment   Objective:  Vital signs in last 24 hours:  Temp:  [98.6 F (37 C)-99 F (37.2 C)] 98.8 F (37.1 C) (12/27 0748) Pulse Rate:  [64-80] 65 (12/27 1030) Resp:  [15-23] 16 (12/27 1030) BP: (147-191)/(65-90) 171/87 (12/27 1030) SpO2:  [95 %-100 %] 97 % (12/27 1030) Weight:  [95.3 kg-97.4 kg] 96.3 kg (12/27 0748)  Weight change: 1.6 kg Filed Weights   02/02/23 1153 02/03/23 0445 02/03/23 0748  Weight: 95.3 kg 97.4 kg 96.3 kg    Intake/Output: I/O last 3 completed shifts: In: 240 [P.O.:240] Out: 1501 [Urine:1; Other:1500]   Intake/Output this shift:  No intake/output data recorded.  Physical Exam: General: NAD  Head: Normocephalic, atraumatic. Moist oral mucosal membranes  Eyes: Anicteric  Lungs:  Clear bilateral, normal effort  Heart: Regular rate and rhythm  Abdomen:  Soft, nontender, PDC  Extremities: 2+ peripheral edema.  Neurologic: Alert, moving all four extremities  Skin: No lesions  Access: PD catheter, right IJ PermCath    Basic Metabolic Panel: Recent Labs  Lab 01/29/23 0304  01/30/23 0426 01/31/23 0330 02/01/23 0510 02/02/23 0605 02/03/23 0539  NA 132* 135 133* 133* 127* 130*  K 3.5 3.5 3.4* 3.6 3.7 3.6  CL 93* 91* 95* 93* 89* 91*  CO2 25 28 27 28 25 28   GLUCOSE 335* 297* 105* 107* 134* 111*  BUN 57* 52* 41* 35* 44* 32*  CREATININE 10.26* 9.94* 8.16* 6.49* 7.75* 5.26*  CALCIUM 8.5* 8.8* 8.4* 8.9 8.6* 8.8*  MG 2.2  --  2.0 2.0 2.1 2.0    Liver Function Tests: No results for input(s): "AST", "ALT", "ALKPHOS", "BILITOT", "PROT", "ALBUMIN" in the last 168 hours.  No results for input(s): "LIPASE", "AMYLASE" in the last 168 hours. No results for input(s): "AMMONIA" in the last 168 hours.  CBC: Recent Labs  Lab 01/31/23 0330 01/31/23 1141 02/01/23 0510 02/02/23 0605 02/03/23 0539  WBC 13.7* 14.5* 14.1* 12.7* 11.7*  HGB 6.5* 6.9* 8.5* 8.6* 8.4*  HCT 19.8* 22.0* 25.6* 25.9* 25.1*  MCV 91.2 94.8 88.9 91.5 88.7  PLT 264 276 278 281 271    Cardiac Enzymes: No results for input(s): "CKTOTAL", "CKMB", "CKMBINDEX", "TROPONINI" in the last 168 hours.  BNP: Invalid input(s): "POCBNP"  CBG: Recent Labs  Lab 02/02/23 0740 02/02/23 1357 02/02/23 1629 02/02/23 1729 02/02/23 2017  GLUCAP 123* 157* 145* 130* 131*    Microbiology: Results for orders placed or performed during the hospital encounter of 01/23/23  SARS Coronavirus 2 by RT PCR (hospital order, performed in [facility] hospital lab) *cepheid single result test* Anterior Nasal Swab     Status: None   Collection Time: 01/23/23  1:08 PM   Specimen: Anterior Nasal Swab  Result Value Ref Range Status   SARS Coronavirus 2 by RT PCR NEGATIVE NEGATIVE Final    Comment: (NOTE) SARS-CoV-2 target nucleic acids are NOT DETECTED.  The SARS-CoV-2 RNA is generally detectable in upper and lower respiratory specimens during the acute phase of infection. The lowest concentration of SARS-CoV-2 viral copies this assay can detect is 250 copies / mL. A negative result does not preclude SARS-CoV-2  infection and should not be used as the sole basis for treatment or other patient management decisions.  A negative result may occur with improper specimen collection / handling, submission of specimen other than nasopharyngeal swab, presence of viral mutation(s) within the areas targeted by this assay, and inadequate number of viral copies (<250 copies / mL). A negative result must be combined with clinical observations, patient history, and epidemiological information.  Fact Sheet for Patients:   RoadLapTop.co.za  Fact Sheet for Healthcare Providers: http://kim-miller.com/  This test is not yet approved or  cleared by the [place] FDA and has been authorized for detection and/or diagnosis of SARS-CoV-2 by FDA under an Emergency Use Authorization (EUA).  This EUA will remain in effect (meaning this test can be used) for the duration of the COVID-19 declaration under Section 564(b)(1) of the Act, 21 U.S.C. section 360bbb-3(b)(1), unless the authorization is terminated or revoked sooner.  Performed at [facility], [name], [place], [place] [address]   Urine Culture (for pregnant, neutropenic or urologic patients or patients with an indwelling urinary catheter)     Status: Abnormal   Collection Time: 01/24/23  2:02 AM   Specimen: Urine, Catheterized  Result Value Ref Range Status   Specimen Description   Final    URINE, CATHETERIZED Performed at [facility], [address], [place], [place] [address]    Special Requests   Final    NONE Performed at [facility], [address] [place], [place] [address]    Culture (A)  Final    <10,000 COLONIES/mL INSIGNIFICANT GROWTH Performed at [facility], [address], [place], [place] [address]    Report Status 01/25/2023 FINAL  Final  Culture, blood (x 2)     Status: None   Collection Time: 01/24/23  5:10 AM   Specimen: BLOOD  Result  Value Ref Range Status   Specimen Description BLOOD LEFT ASSIST CONTROL  Final   Special Requests   Final    BOTTLES DRAWN AEROBIC AND ANAEROBIC Blood Culture results may not be optimal due to an inadequate volume of blood received in culture bottles   Culture   Final    NO GROWTH 5 DAYS Performed at [facility], [address] [place], [place] [address]    Report Status 01/29/2023 FINAL  Final  Culture, blood (x 2)     Status: None   Collection Time: 01/24/23  5:10 AM   Specimen: BLOOD  Result Value Ref Range Status   Specimen Description BLOOD LEFT HAND  Final   Special Requests   Final    BOTTLES DRAWN AEROBIC AND ANAEROBIC Blood Culture results may not be optimal due to an inadequate volume of blood received in culture bottles   Culture   Final    NO GROWTH 5 DAYS Performed at [facility], [address], [place], [place] [address]    Report Status 01/29/2023 FINAL  Final    Coagulation Studies: No results for input(s): "LABPROT", "INR" in the last 72 hours.   Urinalysis: No results  for input(s): "COLORURINE", "LABSPEC", "PHURINE", "GLUCOSEU", "HGBUR", "BILIRUBINUR", "KETONESUR", "PROTEINUR", "UROBILINOGEN", "NITRITE", "LEUKOCYTESUR" in the last 72 hours.  Invalid input(s): "APPERANCEUR"     Imaging: No results found.     Medications:    dialysis solution 2.5% low-MG/low-CA     dialysis solution 4.25% low-MG/low-CA      sodium chloride   Intravenous Once   amLODipine  10 mg Oral Daily   calcitRIOL  0.25 mcg Oral Daily   carvedilol  12.5 mg Oral BID WC   Chlorhexidine Gluconate Cloth  6 each Topical Q0600   cloNIDine  0.1 mg Oral BID   vitamin B-12  1,000 mcg Oral Daily   feeding supplement (NEPRO CARB STEADY)  237 mL Oral BID BM   fluticasone  2 spray Each Nare Daily   folic acid  1 mg Oral Daily   furosemide  80 mg Oral Daily   gentamicin cream  1 Application Topical Daily   insulin aspart  0-5 Units Subcutaneous QHS   insulin  aspart  0-6 Units Subcutaneous TID WC   insulin aspart  10 Units Subcutaneous TID WC   insulin glargine-yfgn  15 Units Subcutaneous Daily   losartan  100 mg Oral Daily   pravastatin  20 mg Oral q1800   sodium chloride flush  10 mL Intravenous Q12H   Vitamin D (Ergocalciferol)  50,000 Units Oral Q7 days   acetaminophen **OR** acetaminophen, albuterol, alteplase, alum & mag hydroxide-simeth, heparin, ondansetron, traZODone  Assessment/ Plan:  Heidi [name] is a 67 y.o.  female  with past medical conditions including GERD, hypertension, dyslipidemia, asthma, osteoarthritis, anemia, and end-stage renal disease on peritoneal dialysis.  Patient presents to the emergency room complaining of shortness of breath and increased edema.  Patient has been admitted for Shortness of breath [R06.02] Fluid overload [E87.70] Symptomatic anemia [D64.9]  [facility] PD  End stage renal disease on peritoneal dialysis. No missed outpatient treatments. Decreased urination may have lead to fluid overload, insufficient dialysis. Will dialyze patient tonight with a 4.25% and 2.5% dialysate to optimize fluid removal.   -Patient has had 2 CT scans, both with contrast, back in March and May.  We feel this may have greatly decreased her residual renal function.  -Appreciate vascular surgery placing right chest PermCath on 01/30/2023. - Receiving UF only treatment today, UF goal 2L.  - Next treatment scheduled for Saturday.  -Renal navigator confirming final arrangements for outpatient clinic.    2. Anemia of chronic kidney disease  Lab Results  Component Value Date   HGB 8.4 (L) 02/03/2023    Hemoglobin 8.4. Will consider ESA as outpatient.   3. Secondary Hyperparathyroidism: with outpatient labs: none  Lab Results  Component Value Date   CALCIUM 8.8 (L) 02/03/2023   CAION 1.26 10/07/2021  Calcium within desired range.   4.  Hypertension with chronic kidney disease.  Home regimen includes amlodipine,  carvedilol, clonidine, hydralazine, losartan, and metoprolol.  Patient currently on amlodipine, carvedilol, clonidine, losartan.  Blood pressure 179/77 during dialysis.       LOS: 10 Verina Galeno 12/27/202410:54 AM

## 2023-02-03 NOTE — Plan of Care (Signed)
  Problem: Fluid Volume: Goal: Hemodynamic stability will improve Outcome: Progressing   Problem: Respiratory: Goal: Ability to maintain adequate ventilation will improve Outcome: Progressing   Problem: Nutrition: Goal: Adequate nutrition will be maintained Outcome: Progressing   Problem: Coping: Goal: Level of anxiety will decrease Outcome: Progressing

## 2023-02-03 NOTE — Progress Notes (Signed)
PROGRESS NOTE    Heidi [name]  NWG:[id number] DOB: [birth year] DOA: 01/23/2023 PCP: Rayetta Humphrey, MD  [id number]  LOS: 10 days   Brief hospital course:   Assessment & Plan: Heidi [name] is a 67 y.o. female with medical history significant for asthma, osteoarthritis, GERD, hypertension, dyslipidemia, stage III CKD, who presented to the emergency room with acute onset of dyspnea with associated orthopnea and worsening lower extremity edema.    She Is admitted for fluid overload due to insufficient PD at home. She had PD while inpatient with no improvement of her symptoms. Nephrology plan to initiate HD next week. She is weak and short of breath, has constipation. Her Hb is dropping, possibly due to hemodilution and ESRD. Her sugars are very high. Insulin regimen adjusted. She is pending HD access tomorrow.   * Fluid overload  Acute pulmonary edema ESRD on peritoneal dialysis. Fluid overload likely due to decreased urination and/or insufficient PD at home. Echo shows EF 60%, grade 2 diastolic dysfunction. She has trouble breathing even with rest. --started on IV lasix 80 mg TID, albumin per nephrology team. --dialysis cath placed, start HD on 12/23 Plan: --cont oral lasix 80 mg daily --iHD per nephro   Acute on chronic anemia of CKD Folate def No active bleeding.  S/p 3u pRBC  Per nephro, pt was not receiving erythropoietin due to risk of RCC. --GI consulted, did not think anemia is due to GI bleed.   --Hematology consulted, favor anemia of CKD.   Continue folic acid and vit B12 supplement. --will need outpatient Hgb monitoring and transfusion PRN  Sepsis, ruled out UTI, ruled out Urine Cultures show insignificant growth.   No antibiotics needed.   Type 2 diabetes mellitus with chronic kidney disease (HCC) With Hyperglycemia A1c 6.1 but blood sugars very high. Low A1c due to severe anemia. --cont glargine 15u daily --mealtime 10u TID --ACHS and SSI   Essential  hypertension Continue coreg, clonidine, losartan and amlodipine --resume hydralazine   Dyslipidemia Continue statin therapy.   Hypokalemia --correct with HD   DVT prophylaxis: SCD/Compression stockings Code Status: Full code  Family Communication:  Level of care: Med-Surg Dispo:   The patient is from: home Anticipated d/c is to: home Anticipated d/c date is: tomorrow   Subjective and Interval History:  Pt reported doing ok.   Objective: Vitals:   02/03/23 1125 02/03/23 1132 02/03/23 1223 02/03/23 1433  BP: (!) 186/85 (!) 177/84 (!) 166/79 (!) 171/70  Pulse: 74 85 82 87  Resp: 18 18 18 18   Temp:  98.6 F (37 C) 98.2 F (36.8 C)   TempSrc:      SpO2: 97% 98% 97% 96%  Weight:  94 kg    Height:        Intake/Output Summary (Last 24 hours) at 02/03/2023 1946 Last data filed at 02/03/2023 1346 Gross per 24 hour  Intake 120 ml  Output 2300 ml  Net -2180 ml   Filed Weights   02/03/23 0445 02/03/23 0748 02/03/23 1132  Weight: 97.4 kg 96.3 kg 94 kg    Examination:   Constitutional: NAD, AAOx3 HEENT: conjunctivae and lids normal, EOMI CV: No cyanosis.   RESP: normal respiratory effort, on RA Neuro: II - XII grossly intact.   Psych: Normal mood and affect.  Appropriate judgement and reason   Data Reviewed: I have personally reviewed labs and imaging studies  Time spent: 35 minutes  Darlin Priestly, MD [facility] If 7PM-7AM, please contact night-coverage 02/03/2023, 7:46  PM

## 2023-02-03 NOTE — Progress Notes (Signed)
Received patient in bed to unit.    Informed consent signed and in chart.    TX duration: 3.5 hrs     Transported back to floor  Hand-off given to patient's nurse.   Access used:  rt chest cvc Access issues: n/a  Total UF removed: 2000 mls Hep locked Medication(s) given:       Maple Hudson, RN Dialysis Unit

## 2023-02-04 DIAGNOSIS — E8779 Other fluid overload: Secondary | ICD-10-CM | POA: Diagnosis not present

## 2023-02-04 LAB — BASIC METABOLIC PANEL
Anion gap: 11 (ref 5–15)
BUN: 40 mg/dL — ABNORMAL HIGH (ref 8–23)
CO2: 25 mmol/L (ref 22–32)
Calcium: 8.6 mg/dL — ABNORMAL LOW (ref 8.9–10.3)
Chloride: 92 mmol/L — ABNORMAL LOW (ref 98–111)
Creatinine, Ser: 6.4 mg/dL — ABNORMAL HIGH (ref 0.44–1.00)
GFR, Estimated: 7 mL/min — ABNORMAL LOW (ref >60)
Glucose, Bld: 107 mg/dL — ABNORMAL HIGH (ref 70–99)
Potassium: 3.5 mmol/L (ref 3.5–5.1)
Sodium: 128 mmol/L — ABNORMAL LOW (ref 135–145)

## 2023-02-04 LAB — CBC
HCT: 25.9 % — ABNORMAL LOW (ref 36.0–46.0)
Hemoglobin: 8.7 g/dL — ABNORMAL LOW (ref 12.0–15.0)
MCH: 30.5 pg (ref 26.0–34.0)
MCHC: 33.6 g/dL (ref 30.0–36.0)
MCV: 90.9 fL (ref 80.0–100.0)
Platelets: 261 10*3/uL (ref 150–400)
RBC: 2.85 MIL/uL — ABNORMAL LOW (ref 3.87–5.11)
RDW: 13.5 % (ref 11.5–15.5)
WBC: 11 10*3/uL — ABNORMAL HIGH (ref 4.0–10.5)
nRBC: 0 % (ref 0.0–0.2)

## 2023-02-04 LAB — GLUCOSE, CAPILLARY
Glucose-Capillary: 100 mg/dL — ABNORMAL HIGH (ref 70–99)
Glucose-Capillary: 113 mg/dL — ABNORMAL HIGH (ref 70–99)

## 2023-02-04 LAB — MAGNESIUM: Magnesium: 2.1 mg/dL (ref 1.7–2.4)

## 2023-02-04 MED ORDER — NOVOLIN 70/30 (70-30) 100 UNIT/ML ~~LOC~~ SUSP: 15.0000 [IU] | Freq: Two times a day (BID) | SUBCUTANEOUS | Status: AC

## 2023-02-04 MED ORDER — FUROSEMIDE 80 MG PO TABS: 80.0000 mg | ORAL_TABLET | Freq: Every day | ORAL | 2 refills | Status: AC

## 2023-02-04 MED ORDER — CYANOCOBALAMIN 1000 MCG PO TABS: 1000.0000 ug | ORAL_TABLET | Freq: Every day | ORAL | 2 refills | Status: AC

## 2023-02-04 MED ORDER — FOLIC ACID 1 MG PO TABS: 1.0000 mg | ORAL_TABLET | Freq: Every day | ORAL | 2 refills | Status: AC

## 2023-02-04 NOTE — Plan of Care (Signed)
  Problem: Fluid Volume: Goal: Hemodynamic stability will improve Outcome: Adequate for Discharge   Problem: Clinical Measurements: Goal: Diagnostic test results will improve Outcome: Adequate for Discharge Goal: Signs and symptoms of infection will decrease Outcome: Adequate for Discharge   Problem: Respiratory: Goal: Ability to maintain adequate ventilation will improve Outcome: Adequate for Discharge   Problem: Education: Goal: Knowledge of General Education information will improve Description: Including pain rating scale, medication(s)/side effects and non-pharmacologic comfort measures Outcome: Adequate for Discharge   Problem: Health Behavior/Discharge Planning: Goal: Ability to manage health-related needs will improve Outcome: Adequate for Discharge   Problem: Clinical Measurements: Goal: Ability to maintain clinical measurements within normal limits will improve Outcome: Adequate for Discharge Goal: Will remain free from infection Outcome: Adequate for Discharge Goal: Diagnostic test results will improve Outcome: Adequate for Discharge Goal: Respiratory complications will improve Outcome: Adequate for Discharge Goal: Cardiovascular complication will be avoided Outcome: Adequate for Discharge   Problem: Activity: Goal: Risk for activity intolerance will decrease Outcome: Adequate for Discharge   Problem: Nutrition: Goal: Adequate nutrition will be maintained Outcome: Adequate for Discharge   Problem: Coping: Goal: Level of anxiety will decrease Outcome: Adequate for Discharge   Problem: Elimination: Goal: Will not experience complications related to bowel motility Outcome: Adequate for Discharge Goal: Will not experience complications related to urinary retention Outcome: Adequate for Discharge   Problem: Pain Management: Goal: General experience of comfort will improve Outcome: Adequate for Discharge   Problem: Safety: Goal: Ability to remain free  from injury will improve Outcome: Adequate for Discharge   Problem: Skin Integrity: Goal: Risk for impaired skin integrity will decrease Outcome: Adequate for Discharge   Problem: Education: Goal: Ability to describe self-care measures that may prevent or decrease complications (Diabetes Survival Skills Education) will improve Outcome: Adequate for Discharge Goal: Individualized Educational Video(s) Outcome: Adequate for Discharge   Problem: Coping: Goal: Ability to adjust to condition or change in health will improve Outcome: Adequate for Discharge   Problem: Fluid Volume: Goal: Ability to maintain a balanced intake and output will improve Outcome: Adequate for Discharge   Problem: Health Behavior/Discharge Planning: Goal: Ability to identify and utilize available resources and services will improve Outcome: Adequate for Discharge Goal: Ability to manage health-related needs will improve Outcome: Adequate for Discharge   Problem: Metabolic: Goal: Ability to maintain appropriate glucose levels will improve Outcome: Adequate for Discharge   Problem: Nutritional: Goal: Maintenance of adequate nutrition will improve Outcome: Adequate for Discharge Goal: Progress toward achieving an optimal weight will improve Outcome: Adequate for Discharge   Problem: Skin Integrity: Goal: Risk for impaired skin integrity will decrease Outcome: Adequate for Discharge   Problem: Tissue Perfusion: Goal: Adequacy of tissue perfusion will improve Outcome: Adequate for Discharge

## 2023-02-04 NOTE — Progress Notes (Signed)
Patient IV removed. Education completed. Patient wheeled to the medical mall exit to discharge to the care of her sisters, in stable condition.  Cornell Barman Jamus Loving

## 2023-02-04 NOTE — Plan of Care (Signed)
  Problem: Fluid Volume: Goal: Hemodynamic stability will improve Outcome: Progressing   Problem: Respiratory: Goal: Ability to maintain adequate ventilation will improve Outcome: Progressing   Problem: Coping: Goal: Level of anxiety will decrease Outcome: Progressing

## 2023-02-04 NOTE — TOC Transition Note (Addendum)
Transition of Care [facility]) - Discharge Note   Patient Details  Name: Heidi [name] MRN: [id number] Date of Birth: 02-17-1955  [facility] CM/SW Contact:  Liliana Cline, LCSW Phone Number: 02/04/2023, 1:09 PM   Clinical Narrative:    Patient is discharging home today. Per HD Coordinator - HD chair time established at [facility]) TTS 5:45 am. To start Tuesday 12/31 at 5:30 am. Patient is scheduled for a PDC flush on Monday 12/30 at 11:00am at [facility].   Final next level of care: Home/Self Care     Patient Goals and CMS Choice            Discharge Placement                       Discharge Plan and Services Additional resources added to the After Visit Summary for                                       Social Drivers of Health (SDOH) Interventions SDOH Screenings   Food Insecurity: No Food Insecurity (01/24/2023)  Housing: Low Risk  (01/25/2023)  Transportation Needs: No Transportation Needs (01/24/2023)  Utilities: Not At Risk (01/24/2023)  Financial Resource Strain: Low Risk  (10/11/2022)   Received from [facility]  Physical Activity: Inactive (03/10/2021)   Received from [facility], [facility]  Stress: No Stress Concern Present (03/10/2021)   Received from [facility], [facility]  Tobacco Use: Low Risk  (01/25/2023)     Readmission Risk Interventions    01/27/2023    1:16 PM  Readmission Risk Prevention Plan  Transportation Screening Complete  Medication Review (RN CM) Complete

## 2023-02-04 NOTE — Progress Notes (Signed)
[facility]  Dialysis Note   Subjective:   Seen and examined on hemodialysis treatment.  Tolerating treatment well.   HEMODIALYSIS FLOWSHEET:  Blood Flow Rate (mL/min): 0 mL/min Arterial Pressure (mmHg): 34.95 mmHg Venous Pressure (mmHg): -32.52 mmHg TMP (mmHg): 14.95 mmHg Ultrafiltration Rate (mL/min): 967 mL/min Dialysate Flow Rate (mL/min): 300 ml/min    Objective:  Vital signs in last 24 hours:  Temp:  [98.3 F (36.8 C)-98.9 F (37.2 C)] 98.5 F (36.9 C) (12/28 1232) Pulse Rate:  [77-90] 89 (12/28 1232) Resp:  [14-22] 21 (12/28 1147) BP: (147-172)/(69-83) 164/75 (12/28 1232) SpO2:  [95 %-100 %] 98 % (12/28 1232) Weight:  [90.9 kg-97.2 kg] 90.9 kg (12/28 1147)  Weight change: -0.5 kg Filed Weights   02/04/23 0429 02/04/23 0755 02/04/23 1147  Weight: 97.2 kg 93 kg 90.9 kg    Intake/Output: I/O last 3 completed shifts: In: 120 [P.O.:120] Out: 2300 [Urine:300; Other:2000]   Intake/Output this shift:  Total I/O In: 0  Out: 3100 [Urine:600; Other:2500]  Physical Exam: General: NAD,   Head: Normocephalic, atraumatic. Moist oral mucosal membranes  Eyes: Anicteric, PERRL  Neck: Supple, trachea midline  Lungs:  Clear to auscultation  Heart: Regular rate and rhythm  Abdomen:  Soft, nontender,   Extremities:  peripheral edema.  Neurologic: Nonfocal, moving all four extremities  Skin: No lesions  Access:     Basic Metabolic Panel: Recent Labs  Lab 01/31/23 0330 02/01/23 0510 02/02/23 0605 02/03/23 0539 02/04/23 0508  NA 133* 133* 127* 130* 128*  K 3.4* 3.6 3.7 3.6 3.5  CL 95* 93* 89* 91* 92*  CO2 27 28 25 28 25   GLUCOSE 105* 107* 134* 111* 107*  BUN 41* 35* 44* 32* 40*  CREATININE 8.16* 6.49* 7.75* 5.26* 6.40*  CALCIUM 8.4* 8.9 8.6* 8.8* 8.6*  MG 2.0 2.0 2.1 2.0 2.1    Liver Function Tests: No results for input(s): "AST", "ALT", "ALKPHOS", "BILITOT", "PROT", "ALBUMIN" in the last 168 hours. No results for input(s): "LIPASE",  "AMYLASE" in the last 168 hours. No results for input(s): "AMMONIA" in the last 168 hours.  CBC: Recent Labs  Lab 01/31/23 1141 02/01/23 0510 02/02/23 0605 02/03/23 0539 02/04/23 0508  WBC 14.5* 14.1* 12.7* 11.7* 11.0*  HGB 6.9* 8.5* 8.6* 8.4* 8.7*  HCT 22.0* 25.6* 25.9* 25.1* 25.9*  MCV 94.8 88.9 91.5 88.7 90.9  PLT 276 278 281 271 261    Cardiac Enzymes: No results for input(s): "CKTOTAL", "CKMB", "CKMBINDEX", "TROPONINI" in the last 168 hours.  BNP: Invalid input(s): "POCBNP"  CBG: Recent Labs  Lab 02/03/23 1220 02/03/23 1651 02/03/23 1959 02/04/23 0749 02/04/23 1228  GLUCAP 95 205* 162* 100* 113*    Microbiology: Results for orders placed or performed during the hospital encounter of 01/23/23  SARS Coronavirus 2 by RT PCR (hospital order, performed in [facility] hospital lab) *cepheid single result test* Anterior Nasal Swab     Status: None   Collection Time: 01/23/23  1:08 PM   Specimen: Anterior Nasal Swab  Result Value Ref Range Status   SARS Coronavirus 2 by RT PCR NEGATIVE NEGATIVE Final    Comment: (NOTE) SARS-CoV-2 target nucleic acids are NOT DETECTED.  The SARS-CoV-2 RNA is generally detectable in upper and lower respiratory specimens during the acute phase of infection. The lowest concentration of SARS-CoV-2 viral copies this assay can detect is 250 copies / mL. A negative result does not preclude SARS-CoV-2 infection and should not be used as the sole basis for treatment or other  patient management decisions.  A negative result may occur with improper specimen collection / handling, submission of specimen other than nasopharyngeal swab, presence of viral mutation(s) within the areas targeted by this assay, and inadequate number of viral copies (<250 copies / mL). A negative result must be combined with clinical observations, patient history, and epidemiological information.  Fact Sheet for Patients:    RoadLapTop.co.za  Fact Sheet for Healthcare Providers: http://kim-miller.com/  This test is not yet approved or  cleared by the [place] FDA and has been authorized for detection and/or diagnosis of SARS-CoV-2 by FDA under an Emergency Use Authorization (EUA).  This EUA will remain in effect (meaning this test can be used) for the duration of the COVID-19 declaration under Section 564(b)(1) of the Act, 21 U.S.C. section 360bbb-3(b)(1), unless the authorization is terminated or revoked sooner.  Performed at [facility], [address] [place], [place] [address]   Urine Culture (for pregnant, neutropenic or urologic patients or patients with an indwelling urinary catheter)     Status: Abnormal   Collection Time: 01/24/23  2:02 AM   Specimen: Urine, Catheterized  Result Value Ref Range Status   Specimen Description   Final    URINE, CATHETERIZED Performed at [facility], [address], [place], [place] [address]    Special Requests   Final    NONE Performed at [facility], [address], [place], [place] [address]    Culture (A)  Final    <10,000 COLONIES/mL INSIGNIFICANT GROWTH Performed at [facility], [address], [place], [place] [address]    Report Status 01/25/2023 FINAL  Final  Culture, blood (x 2)     Status: None   Collection Time: 01/24/23  5:10 AM   Specimen: BLOOD  Result Value Ref Range Status   Specimen Description BLOOD LEFT ASSIST CONTROL  Final   Special Requests   Final    BOTTLES DRAWN AEROBIC AND ANAEROBIC Blood Culture results may not be optimal due to an inadequate volume of blood received in culture bottles   Culture   Final    NO GROWTH 5 DAYS Performed at [facility], [address] [place], [place] [address]    Report Status 01/29/2023 FINAL  Final  Culture, blood (x 2)     Status: None   Collection Time: 01/24/23  5:10 AM   Specimen: BLOOD   Result Value Ref Range Status   Specimen Description BLOOD LEFT HAND  Final   Special Requests   Final    BOTTLES DRAWN AEROBIC AND ANAEROBIC Blood Culture results may not be optimal due to an inadequate volume of blood received in culture bottles   Culture   Final    NO GROWTH 5 DAYS Performed at [facility], [address] [place], [place] [address]    Report Status 01/29/2023 FINAL  Final    Coagulation Studies: No results for input(s): "LABPROT", "INR" in the last 72 hours.  Urinalysis: No results for input(s): "COLORURINE", "LABSPEC", "PHURINE", "GLUCOSEU", "HGBUR", "BILIRUBINUR", "KETONESUR", "PROTEINUR", "UROBILINOGEN", "NITRITE", "LEUKOCYTESUR" in the last 72 hours.  Invalid input(s): "APPERANCEUR"    Imaging: No results found.   Medications:    dialysis solution 2.5% low-MG/low-CA     dialysis solution 4.25% low-MG/low-CA      sodium chloride   Intravenous Once   amLODipine  10 mg Oral Daily   calcitRIOL  0.25 mcg Oral Daily   carvedilol  12.5 mg Oral BID WC  Chlorhexidine Gluconate Cloth  6 each Topical Q0600   cloNIDine  0.1 mg Oral BID   vitamin B-12  1,000 mcg Oral Daily   feeding supplement (NEPRO CARB STEADY)  237 mL Oral BID BM   fluticasone  2 spray Each Nare Daily   folic acid  1 mg Oral Daily   furosemide  80 mg Oral Daily   gentamicin cream  1 Application Topical Daily   hydrALAZINE  25 mg Oral q12n4p   insulin aspart  0-5 Units Subcutaneous QHS   insulin aspart  0-6 Units Subcutaneous TID WC   insulin aspart  10 Units Subcutaneous TID WC   insulin glargine-yfgn  15 Units Subcutaneous Daily   losartan  100 mg Oral Daily   pravastatin  20 mg Oral q1800   sodium chloride flush  10 mL Intravenous Q12H   Vitamin D (Ergocalciferol)  50,000 Units Oral Q7 days   acetaminophen **OR** acetaminophen, albuterol, alteplase, alum & mag hydroxide-simeth, heparin, ondansetron, traZODone  Assessment/ Plan:  Ms. Heidi [name] is a 67 y.o.   female   with past medical conditions including GERD, hypertension, dyslipidemia, asthma, osteoarthritis, anemia, and end-stage renal disease on peritoneal dialysis.  Patient presents to the emergency room complaining of shortness of breath and increased edema.  Patient has been admitted for Shortness of breath [R06.02] Fluid overload [E87.70] Symptomatic anemia [D64.9]   End Stage Renal Disease on hemodialysis: Tolerating dialysis well.  No results found for: "POTASSIUM"  Intake/Output Summary (Last 24 hours) at 02/04/2023 1354 Last data filed at 02/04/2023 1147 Gross per 24 hour  Intake 0 ml  Output 3100 ml  Net -3100 ml    2. Hypertension with chronic kidney disease: Continue present antihypertensives.   BP (!) 164/75 (BP Location: Left Arm)   Pulse 89   Temp 98.5 F (36.9 C)   Resp (!) 21   Ht 5\' 4"  (1.626 m)   Wt 90.9 kg   SpO2 98%   BMI 34.40 kg/m   3. Anemia of chronic kidney disease/ kidney injury/chronic disease/acute blood loss:   Lab Results  Component Value Date   HGB 8.7 (L) 02/04/2023  Will follow anemia protocols.  4. Secondary Hyperparathyroidism:     Lab Results  Component Value Date   CALCIUM 8.6 (L) 02/04/2023   CAION 1.26 10/07/2021   Patient is scheduled for discharge today.    LOS: 11 Lorain Childes, MD [facility] 12/28/20241:54 PM

## 2023-02-04 NOTE — Progress Notes (Signed)
Hemodialysis note  Received patient in bed to unit. Alert and oriented.  Informed consent signed and in chart.  Treatment initiated: 0803 Treatment completed: 1147  Patient tolerated well. Transported back to room, alert without acute distress.  Report given to patient's RN.   Access used: Right Chest HD Catheter Access issues: none  Total UF removed: 2.5 L Medication(s) given:  none  Post HD weight: 90.9 kg   Wolfgang Phoenix [facility]

## 2023-02-04 NOTE — Discharge Summary (Signed)
Physician Discharge Summary   Heidi [name]  female DOB: October 10, 1955  ZOX:[id number]  PCP: Rayetta Humphrey, MD  Admit date: 01/23/2023 Discharge date: ***  Admitted From: *** Disposition:  {disposition:18248} Home Health: {yes/no:20286} CODE STATUS: {Palliative Code status:23503}  Discharge Instructions     Diet Carb Modified   Complete by: As directed    Discharge instructions   Complete by: As directed    Your A1c is only 6.1, so I have reduced your 70/30 insulin from 20 units twice a day to 15 units twice a day, to avoid low blood sugars.  With your anemia, please continue vit B12 supplement and add folic acid supplement.  You will need follow up with outpatient provider (either nephrologist or hematologist) for regular blood work and blood transfusion as needed. - -   No wound care   Complete by: As directed       Hospital Course:  For full details, please see H&P, progress notes, consult notes and ancillary notes.  Briefly,  ***  Unless noted above, medications under "STOP" list are ones pt was not taking PTA.  Discharge Diagnoses:  Principal Problem:   Fluid overload Active Problems:   Acute on chronic anemia   Sepsis due to gram-negative UTI (HCC)   Type 2 diabetes mellitus with chronic kidney disease (HCC)   Essential hypertension   Dyslipidemia   Anemia   Anemia of chronic kidney failure, stage 5 (HCC)   30 Day Unplanned Readmission Risk Score    Flowsheet Row ED to Hosp-Admission (Current) from 01/23/2023 in [facility]  30 Day Unplanned Readmission Risk Score (%) 24.65 Filed at 02/04/2023 0800       This score is the patient's risk of an unplanned readmission within 30 days of being discharged (0 -100%). The score is based on dignosis, age, lab data, medications, orders, and past utilization.   Low:  0-14.9   Medium: 15-21.9   High: 22-29.9   Extreme: 30 and above         Discharge  Instructions:  Allergies as of 02/04/2023       Reactions   Sulfa Antibiotics Nausea And Vomiting   Misc. Sulfonamide Containing Compounds Nausea And Vomiting        Medication List     STOP taking these medications    metoprolol tartrate 100 MG tablet Commonly known as: LOPRESSOR   NovoLIN N 100 UNIT/ML injection Generic drug: insulin NPH Human       TAKE these medications    albuterol 108 (90 Base) MCG/ACT inhaler Commonly known as: VENTOLIN HFA Inhale into the lungs every 6 (six) hours as needed for wheezing or shortness of breath.   amLODipine 10 MG tablet Commonly known as: NORVASC Take 10 mg by mouth daily.   calcitRIOL 0.25 MCG capsule Commonly known as: ROCALTROL Take 0.25 mcg by mouth daily.   carvedilol 12.5 MG tablet Commonly known as: COREG Take 12.5 mg by mouth in the morning, at noon, and at bedtime.   cloNIDine 0.1 MG tablet Commonly known as: CATAPRES Take 0.1 mg by mouth 2 (two) times daily.   cyanocobalamin 1000 MCG tablet Take 1 tablet (1,000 mcg total) by mouth daily.   fluticasone 50 MCG/ACT nasal spray Commonly known as: FLONASE Place into both nostrils daily.   folic acid 1 MG tablet Commonly known as: FOLVITE Take 1 tablet (1 mg total) by mouth daily.   furosemide 80 MG tablet Commonly known as: LASIX  Take 1 tablet (80 mg total) by mouth daily.   hydrALAZINE 25 MG tablet Commonly known as: APRESOLINE Take 25 mg by mouth 2 times daily at 12 noon and 4 pm.   losartan 100 MG tablet Commonly known as: COZAAR Take by mouth.   lovastatin 20 MG tablet Commonly known as: MEVACOR Take by mouth.   NovoLIN 70/30 (70-30) 100 UNIT/ML injection Generic drug: insulin NPH-regular Human Inject 15 Units into the skin 2 (two) times daily with a meal. Reduced from 20 units twice daily. What changed:  how much to take when to take this additional instructions   OneTouch Delica Plus Lancet30G Misc   Precision QID Test test  strip Generic drug: glucose blood 1 each (1 strip total) 3 (three) times daily Use as instructed.   Vitamin D (Ergocalciferol) 1.25 MG (50000 UNIT) Caps capsule Commonly known as: DRISDOL Take 50,000 Units by mouth every 7 (seven) days.         Follow-up Information     Rayetta Humphrey, MD Follow up in 1 week(s).   Specialty: Family Medicine Contact information: [address] [place] [address] [phone]                 Allergies  Allergen Reactions   Sulfa Antibiotics Nausea And Vomiting   Misc. Sulfonamide Containing Compounds Nausea And Vomiting     The results of significant diagnostics from this hospitalization (including imaging, microbiology, ancillary and laboratory) are listed below for reference.   Consultations:   Procedures/Studies: PERIPHERAL VASCULAR CATHETERIZATION Result Date: 01/30/2023 See surgical note for result.  ECHOCARDIOGRAM COMPLETE Result Date: 01/25/2023    ECHOCARDIOGRAM REPORT   Patient Name:   Heidi [name] Date of Exam: 01/25/2023 Medical Rec #:  [id number]       Height:       64.0 in Accession #:    [id number]      Weight:       211.0 lb Date of Birth:  07-14-1955        BSA:          2.001 m Patient Age:    67 years        BP:           144/61 mmHg Patient Gender: F               HR:           74 bpm. Exam Location:  [facility] Procedure: 2D Echo, Cardiac Doppler and Color Doppler Indications:     Dyspnea R06.00  History:         Patient has no prior history of Echocardiogram examinations.                  Risk Factors:Diabetes, Hypertension and Dyslipidemia.  Sonographer:     Cristela Blue Referring Phys:  [id number] Edra Riccardi Diagnosing Phys: Mellody Drown Alluri IMPRESSIONS  1. Left ventricular ejection fraction, by estimation, is 60 to 65%. The left ventricle has normal function. The left ventricle has no regional wall motion abnormalities. There is moderate left ventricular hypertrophy. Left ventricular diastolic parameters are consistent  with Grade II diastolic dysfunction (pseudonormalization).  2. Right ventricular systolic function is normal. The right ventricular size is normal.  3. Left atrial size was mildly dilated.  4. Moderate pericardial effusion. The pericardial effusion is circumferential. There is no evidence of cardiac tamponade. Moderate pleural effusion in the left lateral region.  5. The mitral valve is normal in structure. Mild mitral  valve regurgitation. Severe mitral annular calcification.  6. The aortic valve is tricuspid. Aortic valve regurgitation is not visualized. FINDINGS  Left Ventricle: Left ventricular ejection fraction, by estimation, is 60 to 65%. The left ventricle has normal function. The left ventricle has no regional wall motion abnormalities. The left ventricular internal cavity size was normal in size. There is  moderate left ventricular hypertrophy. Left ventricular diastolic parameters are consistent with Grade II diastolic dysfunction (pseudonormalization). Right Ventricle: The right ventricular size is normal. No increase in right ventricular wall thickness. Right ventricular systolic function is normal. Left Atrium: Left atrial size was mildly dilated. Right Atrium: Right atrial size was normal in size. Pericardium: A moderately sized pericardial effusion is present. The pericardial effusion is circumferential. There is no evidence of cardiac tamponade. Mitral Valve: The mitral valve is normal in structure. Severe mitral annular calcification. Mild mitral valve regurgitation. MV peak gradient, 12.4 mmHg. The mean mitral valve gradient is 5.0 mmHg. Tricuspid Valve: The tricuspid valve is normal in structure. Tricuspid valve regurgitation is mild. Aortic Valve: The aortic valve is tricuspid. Aortic valve regurgitation is not visualized. Aortic valve mean gradient measures 3.0 mmHg. Aortic valve peak gradient measures 5.8 mmHg. Aortic valve area, by VTI measures 3.87 cm. Pulmonic Valve: The pulmonic valve was  not well visualized. Pulmonic valve regurgitation is mild. Aorta: The aortic root is normal in size and structure. IAS/Shunts: The interatrial septum was not assessed. Additional Comments: There is a moderate pleural effusion in the left lateral region.  LEFT VENTRICLE PLAX 2D LVIDd:         3.80 cm   Diastology LVIDs:         2.50 cm   LV e' medial:    5.87 cm/s LV PW:         1.40 cm   LV E/e' medial:  19.3 LV IVS:        1.40 cm   LV e' lateral:   5.98 cm/s LVOT diam:     2.00 cm   LV E/e' lateral: 18.9 LV SV:         90 LV SV Index:   45 LVOT Area:     3.14 cm  RIGHT VENTRICLE RV Basal diam:  4.10 cm RV Mid diam:    4.20 cm RV S prime:     9.68 cm/s TAPSE (M-mode): 2.4 cm LEFT ATRIUM             Index        RIGHT ATRIUM           Index LA diam:        4.40 cm 2.20 cm/m   RA Area:     14.30 cm LA Vol (A2C):   64.1 ml 32.03 ml/m  RA Volume:   31.20 ml  15.59 ml/m LA Vol (A4C):   72.2 ml 36.07 ml/m LA Biplane Vol: 68.4 ml 34.18 ml/m  AORTIC VALVE AV Area (Vmax):    2.92 cm AV Area (Vmean):   2.98 cm AV Area (VTI):     3.87 cm AV Vmax:           120.50 cm/s AV Vmean:          83.700 cm/s AV VTI:            0.234 m AV Peak Grad:      5.8 mmHg AV Mean Grad:      3.0 mmHg LVOT Vmax:         112.00  cm/s LVOT Vmean:        79.400 cm/s LVOT VTI:          0.288 m LVOT/AV VTI ratio: 1.23  AORTA Ao Root diam: 2.70 cm MITRAL VALVE                TRICUSPID VALVE MV Area (PHT): 2.55 cm     TR Peak grad:   21.3 mmHg MV Area VTI:   1.52 cm     TR Vmax:        231.00 cm/s MV Peak grad:  12.4 mmHg MV Mean grad:  5.0 mmHg     SHUNTS MV Vmax:       1.76 m/s     Systemic VTI:  0.29 m MV Vmean:      97.3 cm/s    Systemic Diam: 2.00 cm MV Decel Time: 298 msec MV E velocity: 113.00 cm/s MV A velocity: 147.00 cm/s MV E/A ratio:  0.77 Windell Norfolk Electronically signed by Windell Norfolk Signature Date/Time: 01/25/2023/4:37:13 PM    Final    DG Chest 2 View Result Date: 01/23/2023 CLINICAL DATA:  Shortness of breath.   Intermittent chest pain. EXAM: CHEST - 2 VIEW COMPARISON:  Chest radiograph dated January 13, 2023. FINDINGS: The heart is enlarged. The mediastinal contours are within normal limits. Aortic atherosclerosis. Small left-greater-than-right pleural effusions basilar atelectasis. Mild bilateral bronchial thickening. No pneumothorax. No acute osseous abnormality. IMPRESSION: Cardiomegaly with pulmonary edema and new small left-greater-than-right pleural effusions. Electronically Signed   By: Hart Robinsons M.D.   On: 01/23/2023 15:22   DG Chest 2 View Result Date: 01/23/2023 CLINICAL DATA:  67 year old female with cough EXAM: CHEST - 2 VIEW COMPARISON:  02/03/2020 FINDINGS: Cardiomediastinal silhouette unchanged in size and contour. No evidence of central vascular congestion. No interlobular septal thickening. Bronchial wall thickening and vague reticulonodular opacities in the lower lungs. No pneumothorax or pleural effusion. No acute displaced fracture. Degenerative changes of the spine. IMPRESSION: Mild bronchial thickening and reticulonodular opacities, potentially bronchitis or sequela of other atypical infection. Negative for lobar pneumonia. Electronically Signed   By: Gilmer Mor D.O.   On: 01/23/2023 09:06      Labs: BNP (last 3 results) Recent Labs    01/23/23 1311  BNP 1,587.4*   Basic Metabolic Panel: Recent Labs  Lab 01/31/23 0330 02/01/23 0510 02/02/23 0605 02/03/23 0539 02/04/23 0508  NA 133* 133* 127* 130* 128*  K 3.4* 3.6 3.7 3.6 3.5  CL 95* 93* 89* 91* 92*  CO2 27 28 25 28 25   GLUCOSE 105* 107* 134* 111* 107*  BUN 41* 35* 44* 32* 40*  CREATININE 8.16* 6.49* 7.75* 5.26* 6.40*  CALCIUM 8.4* 8.9 8.6* 8.8* 8.6*  MG 2.0 2.0 2.1 2.0 2.1   Liver Function Tests: No results for input(s): "AST", "ALT", "ALKPHOS", "BILITOT", "PROT", "ALBUMIN" in the last 168 hours. No results for input(s): "LIPASE", "AMYLASE" in the last 168 hours. No results for input(s): "AMMONIA" in the  last 168 hours. CBC: Recent Labs  Lab 01/31/23 1141 02/01/23 0510 02/02/23 0605 02/03/23 0539 02/04/23 0508  WBC 14.5* 14.1* 12.7* 11.7* 11.0*  HGB 6.9* 8.5* 8.6* 8.4* 8.7*  HCT 22.0* 25.6* 25.9* 25.1* 25.9*  MCV 94.8 88.9 91.5 88.7 90.9  PLT 276 278 281 271 261   Cardiac Enzymes: No results for input(s): "CKTOTAL", "CKMB", "CKMBINDEX", "TROPONINI" in the last 168 hours. BNP: Invalid input(s): "POCBNP" CBG: Recent Labs  Lab 02/02/23 2017 02/03/23 1220 02/03/23 1651 02/03/23 1959 02/04/23 0749  GLUCAP 131* 95 205* 162* 100*   D-Dimer No results for input(s): "DDIMER" in the last 72 hours. Hgb A1c No results for input(s): "HGBA1C" in the last 72 hours. Lipid Profile No results for input(s): "CHOL", "HDL", "LDLCALC", "TRIG", "CHOLHDL", "LDLDIRECT" in the last 72 hours. Thyroid function studies No results for input(s): "TSH", "T4TOTAL", "T3FREE", "THYROIDAB" in the last 72 hours.  Invalid input(s): "FREET3" Anemia work up Recent Labs    02/02/23 0605  RETICCTPCT 2.2   Urinalysis    Component Value Date/Time   COLORURINE YELLOW (A) 01/24/2023 0202   APPEARANCEUR CLOUDY (A) 01/24/2023 0202   APPEARANCEUR Clear 07/27/2017 1523   LABSPEC 1.012 01/24/2023 0202   PHURINE 5.0 01/24/2023 0202   GLUCOSEU NEGATIVE 01/24/2023 0202   HGBUR SMALL (A) 01/24/2023 0202   BILIRUBINUR NEGATIVE 01/24/2023 0202   BILIRUBINUR Negative 07/27/2017 1523   KETONESUR NEGATIVE 01/24/2023 0202   PROTEINUR >=300 (A) 01/24/2023 0202   NITRITE NEGATIVE 01/24/2023 0202   LEUKOCYTESUR TRACE (A) 01/24/2023 0202   Sepsis Labs Recent Labs  Lab 02/01/23 0510 02/02/23 0605 02/03/23 0539 02/04/23 0508  WBC 14.1* 12.7* 11.7* 11.0*   Microbiology No results found for this or any previous visit (from the past 240 hours).   Total time spend on discharging this patient, including the last patient exam, discussing the hospital stay, instructions for ongoing care as it relates to all  pertinent caregivers, as well as preparing the medical discharge records, prescriptions, and/or referrals as applicable, is *** minutes.    Darlin Priestly, MD  [facility] 02/04/2023, 9:10 AM

## 2023-02-05 LAB — PROTEIN ELECTROPHORESIS, SERUM
A/G Ratio: 0.9 (ref 0.7–1.7)
Albumin ELP: 2.6 g/dL — ABNORMAL LOW (ref 2.9–4.4)
Alpha-1-Globulin: 0.4 g/dL (ref 0.0–0.4)
Alpha-2-Globulin: 1 g/dL (ref 0.4–1.0)
Beta Globulin: 0.8 g/dL (ref 0.7–1.3)
Gamma Globulin: 0.8 g/dL (ref 0.4–1.8)
Globulin, Total: 3 g/dL (ref 2.2–3.9)
Total Protein ELP: 5.6 g/dL — ABNORMAL LOW (ref 6.0–8.5)

## 2023-02-17 ENCOUNTER — Ambulatory Visit
Admission: RE | Admit: 2023-02-17 | Discharge: 2023-02-17 | Disposition: A | Discharge status: Home / Self Care | Payer: Medicare HMO | Source: Ambulatory Visit | Attending: Family Medicine | Admitting: Family Medicine

## 2023-02-17 DIAGNOSIS — R921 Mammographic calcification found on diagnostic imaging of breast: Secondary | ICD-10-CM | POA: Insufficient documentation

## 2023-02-20 ENCOUNTER — Encounter: Payer: Self-pay | Admitting: Family Medicine

## 2023-02-22 ENCOUNTER — Other Ambulatory Visit: Payer: Self-pay | Admitting: Family Medicine

## 2023-02-22 DIAGNOSIS — R921 Mammographic calcification found on diagnostic imaging of breast: Secondary | ICD-10-CM

## 2023-02-22 DIAGNOSIS — R928 Other abnormal and inconclusive findings on diagnostic imaging of breast: Secondary | ICD-10-CM

## 2023-02-27 ENCOUNTER — Ambulatory Visit
Admission: RE | Admit: 2023-02-27 | Discharge: 2023-02-27 | Disposition: A | Discharge status: Home / Self Care | Payer: Medicare HMO | Source: Ambulatory Visit | Attending: Family Medicine | Admitting: Family Medicine

## 2023-02-27 ENCOUNTER — Other Ambulatory Visit: Payer: Self-pay | Admitting: Family Medicine

## 2023-02-27 DIAGNOSIS — R921 Mammographic calcification found on diagnostic imaging of breast: Secondary | ICD-10-CM

## 2023-02-27 DIAGNOSIS — N641 Fat necrosis of breast: Secondary | ICD-10-CM | POA: Diagnosis present

## 2023-02-27 DIAGNOSIS — R928 Other abnormal and inconclusive findings on diagnostic imaging of breast: Secondary | ICD-10-CM

## 2023-02-27 HISTORY — PX: BREAST BIOPSY: SHX20

## 2023-02-27 MED ORDER — LIDOCAINE 1 % OPTIME INJ - NO CHARGE
5.0000 mL | Freq: Once | INTRAMUSCULAR | Status: AC
  Administered 2023-02-27: 5 mL
  Filled 2023-02-27: qty 6

## 2023-02-27 MED ORDER — LIDOCAINE-EPINEPHRINE 2 %-1:100000 IJ SOLN
5.0000 mL | Freq: Once | INTRAMUSCULAR | Status: AC
  Administered 2023-02-27: 5 mL
  Filled 2023-02-27: qty 5.1

## 2023-02-27 MED ORDER — LIDOCAINE-EPINEPHRINE 2 %-1:100000 IJ SOLN
10.0000 mL | Freq: Once | INTRAMUSCULAR | Status: AC
  Administered 2023-02-27: 10 mL
  Filled 2023-02-27: qty 10.2

## 2023-02-27 MED ORDER — LIDOCAINE HCL (PF) 1 % IJ SOLN
10.0000 mL | Freq: Once | INTRAMUSCULAR | Status: AC
  Administered 2023-02-27: 10 mL
  Filled 2023-02-27: qty 10

## 2023-02-28 ENCOUNTER — Inpatient Hospital Stay (HOSPITAL_BASED_OUTPATIENT_CLINIC_OR_DEPARTMENT_OTHER): Payer: Medicare HMO | Admitting: Oncology

## 2023-02-28 ENCOUNTER — Inpatient Hospital Stay: Payer: Medicare HMO | Attending: Internal Medicine

## 2023-02-28 ENCOUNTER — Other Ambulatory Visit: Payer: Medicare HMO

## 2023-02-28 ENCOUNTER — Ambulatory Visit: Payer: Medicare HMO | Admitting: Oncology

## 2023-02-28 VITALS — BP 180/92 | HR 101 | Temp 97.0°F | Resp 17 | Wt 195.0 lb

## 2023-02-28 DIAGNOSIS — D649 Anemia, unspecified: Secondary | ICD-10-CM

## 2023-02-28 DIAGNOSIS — N186 End stage renal disease: Secondary | ICD-10-CM | POA: Diagnosis not present

## 2023-02-28 DIAGNOSIS — Z803 Family history of malignant neoplasm of breast: Secondary | ICD-10-CM | POA: Insufficient documentation

## 2023-02-28 DIAGNOSIS — N189 Chronic kidney disease, unspecified: Secondary | ICD-10-CM | POA: Diagnosis not present

## 2023-02-28 DIAGNOSIS — N281 Cyst of kidney, acquired: Secondary | ICD-10-CM | POA: Diagnosis not present

## 2023-02-28 DIAGNOSIS — Z862 Personal history of diseases of the blood and blood-forming organs and certain disorders involving the immune mechanism: Secondary | ICD-10-CM | POA: Diagnosis not present

## 2023-02-28 DIAGNOSIS — D631 Anemia in chronic kidney disease: Secondary | ICD-10-CM | POA: Diagnosis not present

## 2023-02-28 DIAGNOSIS — I120 Hypertensive chronic kidney disease with stage 5 chronic kidney disease or end stage renal disease: Secondary | ICD-10-CM | POA: Insufficient documentation

## 2023-02-28 LAB — CBC WITH DIFFERENTIAL/PLATELET
Abs Immature Granulocytes: 0.05 10*3/uL (ref 0.00–0.07)
Basophils Absolute: 0 10*3/uL (ref 0.0–0.1)
Basophils Relative: 0 %
Eosinophils Absolute: 0.1 10*3/uL (ref 0.0–0.5)
Eosinophils Relative: 1 %
HCT: 22.9 % — ABNORMAL LOW (ref 36.0–46.0)
Hemoglobin: 7.3 g/dL — ABNORMAL LOW (ref 12.0–15.0)
Immature Granulocytes: 1 %
Lymphocytes Relative: 19 %
Lymphs Abs: 1.8 10*3/uL (ref 0.7–4.0)
MCH: 29.4 pg (ref 26.0–34.0)
MCHC: 31.9 g/dL (ref 30.0–36.0)
MCV: 92.3 fL (ref 80.0–100.0)
Monocytes Absolute: 0.7 10*3/uL (ref 0.1–1.0)
Monocytes Relative: 7 %
Neutro Abs: 7 10*3/uL (ref 1.7–7.7)
Neutrophils Relative %: 72 %
Platelets: 244 10*3/uL (ref 150–400)
RBC: 2.48 MIL/uL — ABNORMAL LOW (ref 3.87–5.11)
RDW: 14.5 % (ref 11.5–15.5)
WBC: 9.7 10*3/uL (ref 4.0–10.5)
nRBC: 0 % (ref 0.0–0.2)

## 2023-02-28 LAB — SURGICAL PATHOLOGY

## 2023-02-28 NOTE — Progress Notes (Signed)
Patient here for oncology follow-up appointment, expresses no new concerns at this time.    
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
## 2023-03-01 ENCOUNTER — Inpatient Hospital Stay: Payer: Medicare HMO

## 2023-03-04 LAB — SAMPLE TO BLOOD BANK

## 2023-03-17 ENCOUNTER — Ambulatory Visit: Payer: Medicare HMO

## 2023-03-29 ENCOUNTER — Ambulatory Visit (INDEPENDENT_AMBULATORY_CARE_PROVIDER_SITE_OTHER): Payer: Medicare HMO | Admitting: Surgery

## 2023-03-29 ENCOUNTER — Telehealth: Payer: Self-pay | Admitting: Surgery

## 2023-03-29 ENCOUNTER — Encounter: Payer: Self-pay | Admitting: Surgery

## 2023-03-29 VITALS — BP 110/75 | HR 85 | Ht 64.0 in

## 2023-03-29 DIAGNOSIS — Z992 Dependence on renal dialysis: Secondary | ICD-10-CM

## 2023-03-29 DIAGNOSIS — N186 End stage renal disease: Secondary | ICD-10-CM | POA: Diagnosis not present

## 2023-03-29 NOTE — Patient Instructions (Signed)
 You have requested to have your Peritoneal Dialysis Catheter removed. This will be done at [facility] with Dr. Everlene Farrier.  You will most likely be out of work 1-2 weeks for this surgery.  If you have FMLA or disability paperwork that needs filled out you may drop this off at our office or this can be faxed to [phone].  You will return after your post-op appointment with a lifting restriction for approximately 4 more weeks.  Please see the (blue)pre-care form that you have been given today. Our surgery scheduler will call you to verify surgery date and to go over information.   If you have any questions, please call our office.  LET [facility] CARE PROVIDER KNOW ABOUT: Any allergies you have. All medicines you are taking, including vitamins, herbs, eye drops, creams, and over-the-counter medicines. Previous problems you or members of your family have had with the use of anesthetics. Any blood disorders you have. Previous surgeries you have had. Any medical conditions you have. RISKS AND COMPLICATIONS Generally, this is a safe procedure. However, problems may occur, including: Infection. Bleeding. Allergic reactions to medicines. Damage to other structures or organs. The need to convert to open surgery, which requires a larger incision in the abdomen. This may be necessary if your surgeon thinks that it is not safe to continue with a laparoscopic procedure. BEFORE THE PROCEDURE Ask your health care provider about: Changing or stopping your regular medicines. This is especially important if you are taking diabetes medicines or blood thinners. Taking medicines such as aspirin and ibuprofen. These medicines can thin your blood. Do not take these medicines before your procedure if your health care provider instructs you not to. Follow instructions from your health care provider about eating or drinking restrictions. Let your health care provider know if you develop a cold or an  infection before surgery. Plan to have someone take you home after the procedure. Ask your health care provider how your surgical site will be marked or identified. You may be given antibiotic medicine to help prevent infection. PROCEDURE To reduce your risk of infection: Your health care team will wash or sanitize their hands. Your skin will be washed with soap. An IV tube may be inserted into one of your veins. You will be given a medicine to make you fall asleep (general anesthetic). A breathing tube will be placed in your mouth. The surgeon will make several small cuts (incisions) in your abdomen. A thin, lighted tube (laparoscope) that has a tiny camera on the end will be inserted through one of the small incisions. The camera on the laparoscope will send a picture to a TV screen (monitor) in the operating room. This will give the surgeon a good view inside your abdomen. A gas will be pumped into your abdomen. This will expand your abdomen to give the surgeon more room to perform the surgery. Other tools that are needed for the procedure will be inserted through the other incisions. The dialysis catheter will be removed through one of the incisions. The incisions will be closed with stitches (sutures), staples, or skin glue. Your incisions may be covered with a bandage (dressing). The procedure may vary among health care providers and hospitals. AFTER THE PROCEDURE Your blood pressure, heart rate, breathing rate, and blood oxygen level will be monitored often until the medicines you were given have worn off. You will be given medicines as needed to control your pain.   This information is not intended to replace  advice given to you by your health care provider. Make sure you discuss any questions you have with your health care provider.   Document Released: 01/24/2005 Document Revised: 10/15/2014 Document Reviewed: 09/05/2012 Elsevier Interactive Patient Education [employer].

## 2023-03-29 NOTE — H&P (View-Only) (Signed)
 Surgical Consultation  03/29/2023  Heidi [name] is an 68 y.o. female.   Chief Complaint  Patient presents with   Follow-up     HPI: 34 yo s/p lap PD cath 8/ 2023, working well but over the last few months startling having hemodynamic volumes issues.  Nephrology felt patient would benefit more from transitioning to hemodialysis due to fluid management concerns. Patient agreeable. SHe is now on HD T,T, S. She does have chronic anemia and is seen Dr Smith Robert, last hb 7.3 , she is ok w transfusions.   Past Medical History:  Diagnosis Date   Allergic rhinitis    Arthritis    sacroiliac joint   Asthma    Breast mass in female 09/05/2014   Degenerative arthritis of lumbar spine    Diabetes mellitus without complication (HCC)    Genu valgus, congenital    GERD (gastroesophageal reflux disease)    Hyperlipidemia    Hypertension    Hyponatremia    Microalbuminuria    Pes planus    Renal disorder    CKD stage 3   Tricompartment degenerative joint disease of knee    Trochanteric bursitis of both hips    Volvulus (HCC)     Past Surgical History:  Procedure Laterality Date   ABDOMINAL HYSTERECTOMY     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   BREAST BIOPSY Right 1996   benign   BREAST BIOPSY Right 07/13/2022   stereo biopsy/ x clip/ fat necrosis   BREAST BIOPSY Right 07/13/2022   MM RT BREAST BX W LOC DEV 1ST LESION IMAGE BX SPEC STEREO GUIDE 07/13/2022 [facility]   BREAST BIOPSY Right 02/27/2023   Stereo Bx Coil clip- path pending   BREAST BIOPSY Right 02/27/2023   Stereo Bx, ribbon clip- path pending   BREAST BIOPSY Right 02/27/2023   MM RT BREAST BX W LOC DEV 1ST LESION IMAGE BX SPEC STEREO GUIDE 02/27/2023 [facility]   BREAST BIOPSY Right 02/27/2023   MM RT BREAST BX W LOC DEV EA AD LESION IMG BX SPEC STEREO GUIDE 02/27/2023 [facility]   BREAST EXCISIONAL BIOPSY Right 2016   broken ankle surgery Right 02/2021   CAPD INSERTION N/A 10/07/2021   Procedure:  LAPAROSCOPIC INSERTION CONTINUOUS AMBULATORY PERITONEAL DIALYSIS  (CAPD) CATHETER;  Surgeon: Leafy Ro, MD;  Location: [facility];  Service: General;  Laterality: N/A;   COLONOSCOPY     COLONOSCOPY N/A 12/19/2019   Procedure: COLONOSCOPY;  Surgeon: Toledo, Boykin Nearing, MD;  Location: [facility];  Service: Gastroenterology;  Laterality: N/A;   DIALYSIS/PERMA CATHETER INSERTION N/A 01/30/2023   Procedure: DIALYSIS/PERMA CATHETER INSERTION;  Surgeon: Annice Needy, MD;  Location: [facility];  Service: Cardiovascular;  Laterality: N/A;   EXPLORATORY LAPAROTOMY     of colon for torsion   INSERTION OF MESH  10/07/2021   Procedure: INSERTION OF MESH;  Surgeon: Leafy Ro, MD;  Location: [facility];  Service: General;;   NASAL FRACTURE SURGERY     UMBILICAL HERNIA REPAIR N/A 10/07/2021   Procedure: HERNIA REPAIR UMBILICAL ADULT;  Surgeon: Leafy Ro, MD;  Location: [facility];  Service: General;  Laterality: N/A;    Family History  Problem Relation Age of Onset   Diabetes Mother    Diabetes Father    Cancer Father    Breast cancer Maternal Aunt     Social History:  reports that she has never smoked. She has never been exposed to tobacco smoke. She has never used smokeless  tobacco. She reports current alcohol use. She reports that she does not use drugs.  Allergies:  Allergies  Allergen Reactions   Sulfa Antibiotics Nausea And Vomiting   Misc. Sulfonamide Containing Compounds Nausea And Vomiting    Medications reviewed.     ROS Full ROS performed and is otherwise negative other than what is stated in the HPI    Ht 5\' 4"  (1.626 m)   BMI 33.47 kg/m   Physical Exam Vitals and nursing note reviewed. Exam conducted with a chaperone present.  Constitutional:      General: She is not in acute distress.    Appearance: Normal appearance. She is normal weight. She is not ill-appearing.  Cardiovascular:     Rate and Rhythm: Normal rate and regular rhythm.     Heart  sounds: No murmur heard. Pulmonary:     Effort: Pulmonary effort is normal. No respiratory distress.     Breath sounds: Normal breath sounds. No stridor. No wheezing or rhonchi.  Abdominal:     General: Abdomen is flat. There is no distension.     Palpations: Abdomen is soft. There is no mass.     Tenderness: There is no abdominal tenderness. There is no guarding or rebound.     Hernia: No hernia is present.     Comments: PD cath in place w/o infection  Musculoskeletal:     Cervical back: Normal range of motion and neck supple. No rigidity or tenderness.  Lymphadenopathy:     Cervical: No cervical adenopathy.  Skin:    General: Skin is warm and dry.     Capillary Refill: Capillary refill takes less than 2 seconds.  Neurological:     General: No focal deficit present.     Mental Status: She is alert and oriented to person, place, and time.  Psychiatric:        Mood and Affect: Mood normal.        Behavior: Behavior normal.        Thought Content: Thought content normal.        Judgment: Judgment normal.    Assessment/Plan: 68 year old female with functional PD catheter but having issues.  Hemodialysis is a better fit for her clinical condition.  There is no longer need for PD catheter.  Patient wishes to have this removed.  I do think that is reasonable.  Procedure discussed with her in detail.  Will tentatively plan for a nondialysis day.  Will also have to type and screen her since her hemoglobin is right at 7.  She is okay if we need to transfuse her.  Procedure discussed with her in detail.  Risk, benefits and possible complications including but not limited to: Bleeding, infection, anesthetic complications, she understands and wished to proceed.  Please note that I spent 40 minutes in this encounter including reviewing extensive medical records, counseling the patient, placing orders and performing documentation   [name], MD [facility] General Surgeon

## 2023-03-29 NOTE — Telephone Encounter (Signed)
 Patient has been advised of Pre-Admission date/time, and Surgery date at [facility].  Surgery Date: 03/31/23 Preadmission Testing Date: 03/31/23 (2 hours early)  Patient has been made aware to call [phone], between 1-3:00pm the day before surgery, to find out what time to arrive for surgery.

## 2023-03-29 NOTE — Progress Notes (Signed)
 Surgical Consultation  03/29/2023  Heidi [name] is an 68 y.o. female.   Chief Complaint  Patient presents with   Follow-up     HPI: 43 yo s/p lap PD cath 8/ 2023, working well but over the last few months startling having hemodynamic volumes issues.  Nephrology felt patient would benefit more from transitioning to hemodialysis due to fluid management concerns. Patient agreeable. SHe is now on HD T,T, S. She does have chronic anemia and is seen Dr Smith Robert, last hb 7.3 , she is ok w transfusions.   Past Medical History:  Diagnosis Date   Allergic rhinitis    Arthritis    sacroiliac joint   Asthma    Breast mass in female 09/05/2014   Degenerative arthritis of lumbar spine    Diabetes mellitus without complication (HCC)    Genu valgus, congenital    GERD (gastroesophageal reflux disease)    Hyperlipidemia    Hypertension    Hyponatremia    Microalbuminuria    Pes planus    Renal disorder    CKD stage 3   Tricompartment degenerative joint disease of knee    Trochanteric bursitis of both hips    Volvulus (HCC)     Past Surgical History:  Procedure Laterality Date   ABDOMINAL HYSTERECTOMY     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   BREAST BIOPSY Right 1996   benign   BREAST BIOPSY Right 07/13/2022   stereo biopsy/ x clip/ fat necrosis   BREAST BIOPSY Right 07/13/2022   MM RT BREAST BX W LOC DEV 1ST LESION IMAGE BX SPEC STEREO GUIDE 07/13/2022 [facility]   BREAST BIOPSY Right 02/27/2023   Stereo Bx Coil clip- path pending   BREAST BIOPSY Right 02/27/2023   Stereo Bx, ribbon clip- path pending   BREAST BIOPSY Right 02/27/2023   MM RT BREAST BX W LOC DEV 1ST LESION IMAGE BX SPEC STEREO GUIDE 02/27/2023 [facility]   BREAST BIOPSY Right 02/27/2023   MM RT BREAST BX W LOC DEV EA AD LESION IMG BX SPEC STEREO GUIDE 02/27/2023 [facility]   BREAST EXCISIONAL BIOPSY Right 2016   broken ankle surgery Right 02/2021   CAPD INSERTION N/A 10/07/2021   Procedure:  LAPAROSCOPIC INSERTION CONTINUOUS AMBULATORY PERITONEAL DIALYSIS  (CAPD) CATHETER;  Surgeon: Leafy Ro, MD;  Location: [facility];  Service: General;  Laterality: N/A;   COLONOSCOPY     COLONOSCOPY N/A 12/19/2019   Procedure: COLONOSCOPY;  Surgeon: Toledo, Boykin Nearing, MD;  Location: [facility];  Service: Gastroenterology;  Laterality: N/A;   DIALYSIS/PERMA CATHETER INSERTION N/A 01/30/2023   Procedure: DIALYSIS/PERMA CATHETER INSERTION;  Surgeon: Annice Needy, MD;  Location: [facility];  Service: Cardiovascular;  Laterality: N/A;   EXPLORATORY LAPAROTOMY     of colon for torsion   INSERTION OF MESH  10/07/2021   Procedure: INSERTION OF MESH;  Surgeon: Leafy Ro, MD;  Location: [facility];  Service: General;;   NASAL FRACTURE SURGERY     UMBILICAL HERNIA REPAIR N/A 10/07/2021   Procedure: HERNIA REPAIR UMBILICAL ADULT;  Surgeon: Leafy Ro, MD;  Location: [facility];  Service: General;  Laterality: N/A;    Family History  Problem Relation Age of Onset   Diabetes Mother    Diabetes Father    Cancer Father    Breast cancer Maternal Aunt     Social History:  reports that she has never smoked. She has never been exposed to tobacco smoke. She has never used smokeless  tobacco. She reports current alcohol use. She reports that she does not use drugs.  Allergies:  Allergies  Allergen Reactions   Sulfa Antibiotics Nausea And Vomiting   Misc. Sulfonamide Containing Compounds Nausea And Vomiting    Medications reviewed.     ROS Full ROS performed and is otherwise negative other than what is stated in the HPI    Ht 5\' 4"  (1.626 m)   BMI 33.47 kg/m   Physical Exam Vitals and nursing note reviewed. Exam conducted with a chaperone present.  Constitutional:      General: She is not in acute distress.    Appearance: Normal appearance. She is normal weight. She is not ill-appearing.  Cardiovascular:     Rate and Rhythm: Normal rate and regular rhythm.     Heart  sounds: No murmur heard. Pulmonary:     Effort: Pulmonary effort is normal. No respiratory distress.     Breath sounds: Normal breath sounds. No stridor. No wheezing or rhonchi.  Abdominal:     General: Abdomen is flat. There is no distension.     Palpations: Abdomen is soft. There is no mass.     Tenderness: There is no abdominal tenderness. There is no guarding or rebound.     Hernia: No hernia is present.     Comments: PD cath in place w/o infection  Musculoskeletal:     Cervical back: Normal range of motion and neck supple. No rigidity or tenderness.  Lymphadenopathy:     Cervical: No cervical adenopathy.  Skin:    General: Skin is warm and dry.     Capillary Refill: Capillary refill takes less than 2 seconds.  Neurological:     General: No focal deficit present.     Mental Status: She is alert and oriented to person, place, and time.  Psychiatric:        Mood and Affect: Mood normal.        Behavior: Behavior normal.        Thought Content: Thought content normal.        Judgment: Judgment normal.    Assessment/Plan: 68 year old female with functional PD catheter but having issues.  Hemodialysis is a better fit for her clinical condition.  There is no longer need for PD catheter.  Patient wishes to have this removed.  I do think that is reasonable.  Procedure discussed with her in detail.  Will tentatively plan for a nondialysis day.  Will also have to type and screen her since her hemoglobin is right at 7.  She is okay if we need to transfuse her.  Procedure discussed with her in detail.  Risk, benefits and possible complications including but not limited to: Bleeding, infection, anesthetic complications, she understands and wished to proceed.  Please note that I spent 40% reviewing extensive medical records, counseling the patient, placing orders and performing documentation   [name], MD [facility] General Surgeon

## 2023-03-30 ENCOUNTER — Other Ambulatory Visit: Payer: Self-pay

## 2023-03-30 ENCOUNTER — Encounter
Admission: RE | Admit: 2023-03-30 | Discharge: 2023-03-30 | Disposition: A | Discharge status: Home / Self Care | Payer: Medicare HMO | Source: Ambulatory Visit | Attending: Surgery | Admitting: Surgery

## 2023-03-30 DIAGNOSIS — Z01812 Encounter for preprocedural laboratory examination: Secondary | ICD-10-CM

## 2023-03-30 DIAGNOSIS — Z79899 Other long term (current) drug therapy: Secondary | ICD-10-CM

## 2023-03-30 DIAGNOSIS — D631 Anemia in chronic kidney disease: Secondary | ICD-10-CM

## 2023-03-30 DIAGNOSIS — E119 Type 2 diabetes mellitus without complications: Secondary | ICD-10-CM

## 2023-03-30 HISTORY — DX: Chronic kidney disease, stage 5: D63.1

## 2023-03-30 HISTORY — DX: Cardiac murmur, unspecified: R01.1

## 2023-03-30 HISTORY — DX: Hypertensive chronic kidney disease with stage 5 chronic kidney disease or end stage renal disease: I12.0

## 2023-03-30 HISTORY — DX: Sleep apnea, unspecified: G47.30

## 2023-03-30 HISTORY — DX: Pneumonia, unspecified organism: J18.9

## 2023-03-30 HISTORY — DX: Dependence on renal dialysis: Z99.2

## 2023-03-30 NOTE — Patient Instructions (Addendum)
 Your procedure is scheduled on: 03/31/23 - Friday Report to the Registration Desk on the 1st floor of the [facility]. To find out your arrival time, please call [phone] between 1PM - 3PM on: 03/30/23 - Thursday If your arrival time is 6:00 am, do not arrive before that time as the [facility] entrance doors do not open until 6:00 am.  REMEMBER: Instructions that are not followed completely may result in serious medical risk, up to and including death; or upon the discretion of your surgeon and anesthesiologist your surgery may need to be rescheduled.  Do not eat food after midnight the night before surgery.  No gum chewing or hard candies.  You may however, drink water up to 2 hours before you are scheduled to arrive for your surgery. Do not drink anything within 2 hours of your scheduled arrival time.  One week prior to surgery: Stop Anti-inflammatories (NSAIDS) such as Advil, Aleve, Ibuprofen, Motrin, Naproxen, Naprosyn and Aspirin based products such as Excedrin, Goody's Powder, BC Powder. You may take Tylenol if needed for pain up until the day of surgery.  Stop ANY OVER THE COUNTER supplements until after surgery : folic acid (FOLVITE), cyanocobalamin , calcitRIOL (ROCALTROL) .   Hold on the morning of surgery - losartan (COZAAR) and furosemide (LASIX)   NOVOLIN 70/30 - hold morning dose of surgery, Friday.  ON THE DAY OF SURGERY ONLY TAKE THESE MEDICATIONS WITH SIPS OF WATER:  albuterol (VENTOLIN HFA)  amLODipine (NORVASC)  carvedilol (COREG) cloNIDine (CATAPRES)  fluticasone (FLONASE)     No Alcohol for 24 hours before or after surgery.  No Smoking including e-cigarettes for 24 hours before surgery.  No chewable tobacco products for at least 6 hours before surgery.  No nicotine patches on the day of surgery.  Do not use any "recreational" drugs for at least a week (preferably 2 weeks) before your surgery.  Please be advised that the combination of cocaine and  anesthesia may have negative outcomes, up to and including death. If you test positive for cocaine, your surgery will be cancelled.  On the morning of surgery brush your teeth with toothpaste and water, you may rinse your mouth with mouthwash if you wish. Do not swallow any toothpaste or mouthwash.  Use CHG Soap or wipes as directed on instruction sheet.  Do not wear jewelry, make-up, hairpins, clips or nail polish.  For welded (permanent) jewelry: bracelets, anklets, waist bands, etc.  Please have this removed prior to surgery.  If it is not removed, there is a chance that hospital personnel will need to cut it off on the day of surgery.  Do not wear lotions, powders, or perfumes.   Do not shave body hair from the neck down 48 hours before surgery.  Contact lenses, hearing aids and dentures may not be worn into surgery.  Do not bring valuables to the hospital. [facility] is not responsible for any missing/lost belongings or valuables.   Notify your doctor if there is any change in your medical condition (cold, fever, infection).  Wear comfortable clothing (specific to your surgery type) to the hospital.  After surgery, you can help prevent lung complications by doing breathing exercises.  Take deep breaths and cough every 1-2 hours. Your doctor may order a device called an Incentive Spirometer to help you take deep breaths. When coughing or sneezing, hold a pillow firmly against your incision with both hands. This is called "splinting." Doing this helps protect your incision. It also decreases  belly discomfort.  If you are being admitted to the hospital overnight, leave your suitcase in the car. After surgery it may be brought to your room.  In case of increased patient census, it may be necessary for you, the patient, to continue your postoperative care in the Same Day Surgery department.  If you are being discharged the day of surgery, you will not be allowed to drive home. You  will need a responsible individual to drive you home and stay with you for 24 hours after surgery.   If you are taking public transportation, you will need to have a responsible individual with you.  Please call the Pre-admissions Testing Dept. at [phone] if you have any questions about these instructions.  Surgery Visitation Policy:  Patients having surgery or a procedure may have two visitors.  Children under the age of 58 must have an adult with them who is not the patient.  Temporary Visitor Restrictions Due to increasing cases of flu, RSV and COVID-19: Children ages 4 and under will not be able to visit patients in [facility] hospitals under most circumstances.  Inpatient Visitation:    Visiting hours are 7 a.m. to 8 p.m. Up to four visitors are allowed at one time in a patient room. The visitors may rotate out with other people during the day.  One visitor age 47 or older may stay with the patient overnight and must be in the room by 8 p.m. Preparing the Skin Before Surgery     To help prevent the risk of infection at your surgical site, we are now providing you with rinse-free Sage 2% Chlorhexidine Gluconate (CHG) disposable wipes.  Chlorhexidine Gluconate (CHG) Soap  o An antiseptic cleaner that kills germs and bonds with the skin to continue killing germs even after washing  o Used for showering the night before surgery and morning of surgery  The night before surgery: Shower or bathe with warm water. Do not apply perfume, lotions, powders. Wait one hour after shower. Skin should be dry and cool. Open Sage wipe package - use 6 disposable cloths. Wipe body using one cloth for the right arm, one cloth for the left arm, one cloth for the right leg, one cloth for the left leg, one cloth for the chest/abdomen area, and one cloth for the back. Do not use on open wounds or sores. Do not use on face or genitals (private parts). If you are breast feeding, do not use  on breasts. 5. Do not rinse, allow to dry. 6. Skin may feel "tacky" for several minutes. 7. Dress in clean clothes. 8. Place clean sheets on your bed and do not sleep with pets.  REPEAT ABOVE ON THE MORNING OF SURGERY BEFORE ARRIVING TO THE HOSPITAL.

## 2023-03-31 ENCOUNTER — Ambulatory Visit: Payer: Self-pay | Admitting: Urgent Care

## 2023-03-31 ENCOUNTER — Encounter: Payer: Self-pay | Admitting: Surgery

## 2023-03-31 ENCOUNTER — Ambulatory Visit
Admission: RE | Admit: 2023-03-31 | Discharge: 2023-03-31 | Disposition: A | Discharge status: Home / Self Care | Payer: Medicare HMO | Attending: Surgery | Admitting: Surgery

## 2023-03-31 ENCOUNTER — Encounter
Admission: RE | Disposition: A | Discharge status: Home / Self Care | Payer: Self-pay | Source: Home / Self Care | Attending: Surgery

## 2023-03-31 ENCOUNTER — Other Ambulatory Visit: Payer: Self-pay

## 2023-03-31 DIAGNOSIS — J45909 Unspecified asthma, uncomplicated: Secondary | ICD-10-CM | POA: Diagnosis not present

## 2023-03-31 DIAGNOSIS — I120 Hypertensive chronic kidney disease with stage 5 chronic kidney disease or end stage renal disease: Secondary | ICD-10-CM | POA: Insufficient documentation

## 2023-03-31 DIAGNOSIS — Z992 Dependence on renal dialysis: Secondary | ICD-10-CM | POA: Insufficient documentation

## 2023-03-31 DIAGNOSIS — Z794 Long term (current) use of insulin: Secondary | ICD-10-CM | POA: Diagnosis not present

## 2023-03-31 DIAGNOSIS — D649 Anemia, unspecified: Secondary | ICD-10-CM | POA: Diagnosis not present

## 2023-03-31 DIAGNOSIS — E1122 Type 2 diabetes mellitus with diabetic chronic kidney disease: Secondary | ICD-10-CM | POA: Insufficient documentation

## 2023-03-31 DIAGNOSIS — N186 End stage renal disease: Secondary | ICD-10-CM | POA: Diagnosis not present

## 2023-03-31 DIAGNOSIS — G4730 Sleep apnea, unspecified: Secondary | ICD-10-CM | POA: Diagnosis not present

## 2023-03-31 DIAGNOSIS — K219 Gastro-esophageal reflux disease without esophagitis: Secondary | ICD-10-CM | POA: Diagnosis not present

## 2023-03-31 DIAGNOSIS — Z79899 Other long term (current) drug therapy: Secondary | ICD-10-CM

## 2023-03-31 DIAGNOSIS — D631 Anemia in chronic kidney disease: Secondary | ICD-10-CM

## 2023-03-31 HISTORY — PX: CAPD REMOVAL: SHX5234

## 2023-03-31 LAB — CBC
HCT: 28.1 % — ABNORMAL LOW (ref 36.0–46.0)
Hemoglobin: 9 g/dL — ABNORMAL LOW (ref 12.0–15.0)
MCH: 28.2 pg (ref 26.0–34.0)
MCHC: 32 g/dL (ref 30.0–36.0)
MCV: 88.1 fL (ref 80.0–100.0)
Platelets: 255 10*3/uL (ref 150–400)
RBC: 3.19 MIL/uL — ABNORMAL LOW (ref 3.87–5.11)
RDW: 15 % (ref 11.5–15.5)
WBC: 6.9 10*3/uL (ref 4.0–10.5)
nRBC: 0 % (ref 0.0–0.2)

## 2023-03-31 LAB — TYPE AND SCREEN
ABO/RH(D): O POS
Antibody Screen: POSITIVE
DAT, IgG: NEGATIVE
DAT, complement: NEGATIVE

## 2023-03-31 LAB — BASIC METABOLIC PANEL
Anion gap: 11 (ref 5–15)
BUN: 57 mg/dL — ABNORMAL HIGH (ref 8–23)
CO2: 27 mmol/L (ref 22–32)
Calcium: 9.6 mg/dL (ref 8.9–10.3)
Chloride: 99 mmol/L (ref 98–111)
Creatinine, Ser: 8.19 mg/dL — ABNORMAL HIGH (ref 0.44–1.00)
GFR, Estimated: 5 mL/min — ABNORMAL LOW (ref >60)
Glucose, Bld: 173 mg/dL — ABNORMAL HIGH (ref 70–99)
Potassium: 3.7 mmol/L (ref 3.5–5.1)
Sodium: 137 mmol/L (ref 135–145)

## 2023-03-31 LAB — SURGICAL PATHOLOGY

## 2023-03-31 LAB — GLUCOSE, CAPILLARY
Glucose-Capillary: 161 mg/dL — ABNORMAL HIGH (ref 70–99)
Glucose-Capillary: 185 mg/dL — ABNORMAL HIGH (ref 70–99)

## 2023-03-31 SURGERY — LAPAROSCOPIC REMOVAL CONTINUOUS AMBULATORY PERITONEAL DIALYSIS  (CAPD) CATHETER
Anesthesia: General

## 2023-03-31 MED ORDER — ALBUTEROL SULFATE HFA 108 (90 BASE) MCG/ACT IN AERS
INHALATION_SPRAY | RESPIRATORY_TRACT | Status: DC | PRN
  Administered 2023-03-31: 6 via RESPIRATORY_TRACT

## 2023-03-31 MED ORDER — FENTANYL CITRATE (PF) 100 MCG/2ML IJ SOLN
INTRAMUSCULAR | Status: DC | PRN
  Administered 2023-03-31 (×2): 50 ug via INTRAVENOUS

## 2023-03-31 MED ORDER — CHLORHEXIDINE GLUCONATE 0.12 % MT SOLN
OROMUCOSAL | Status: AC
  Filled 2023-03-31: qty 15

## 2023-03-31 MED ORDER — BUPIVACAINE LIPOSOME 1.3 % IJ SUSP
INTRAMUSCULAR | Status: AC
  Filled 2023-03-31: qty 20

## 2023-03-31 MED ORDER — HYDROCODONE-ACETAMINOPHEN 5-325 MG PO TABS
1.0000 | ORAL_TABLET | Freq: Four times a day (QID) | ORAL | 0 refills | Status: DC | PRN
Start: 2023-03-31 — End: 2023-04-17

## 2023-03-31 MED ORDER — ORAL CARE MOUTH RINSE: 15.0000 mL | Freq: Once | OROMUCOSAL | Status: AC

## 2023-03-31 MED ORDER — CHLORHEXIDINE GLUCONATE CLOTH 2 % EX PADS: 6.0000 | MEDICATED_PAD | Freq: Once | CUTANEOUS | Status: DC

## 2023-03-31 MED ORDER — OXYCODONE HCL 5 MG/5ML PO SOLN: 5.0000 mg | Freq: Once | ORAL | Status: DC | PRN

## 2023-03-31 MED ORDER — SUGAMMADEX SODIUM 200 MG/2ML IV SOLN
INTRAVENOUS | Status: DC | PRN
  Administered 2023-03-31: 200 mg via INTRAVENOUS

## 2023-03-31 MED ORDER — PROPOFOL 10 MG/ML IV BOLUS
INTRAVENOUS | Status: DC | PRN
  Administered 2023-03-31: 100 mg via INTRAVENOUS
  Administered 2023-03-31: 200 mg via INTRAVENOUS

## 2023-03-31 MED ORDER — ROCURONIUM BROMIDE 100 MG/10ML IV SOLN
INTRAVENOUS | Status: DC | PRN
  Administered 2023-03-31: 50 mg via INTRAVENOUS

## 2023-03-31 MED ORDER — LACTATED RINGERS IV SOLN: INTRAVENOUS | Status: DC | PRN

## 2023-03-31 MED ORDER — SODIUM CHLORIDE 0.9% FLUSH: 3.0000 mL | INTRAVENOUS | Status: DC | PRN

## 2023-03-31 MED ORDER — CHLORHEXIDINE GLUCONATE CLOTH 2 % EX PADS
6.0000 | MEDICATED_PAD | Freq: Once | CUTANEOUS | Status: DC
Start: 2023-03-31 — End: 2023-03-31

## 2023-03-31 MED ORDER — CHLORHEXIDINE GLUCONATE 0.12 % MT SOLN
15.0000 mL | Freq: Once | OROMUCOSAL | Status: AC
  Administered 2023-03-31: 15 mL via OROMUCOSAL

## 2023-03-31 MED ORDER — BUPIVACAINE-EPINEPHRINE 0.25% -1:200000 IJ SOLN
INTRAMUSCULAR | Status: DC | PRN
  Administered 2023-03-31: 30 mL

## 2023-03-31 MED ORDER — GABAPENTIN 300 MG PO CAPS
300.0000 mg | ORAL_CAPSULE | ORAL | Status: AC
  Administered 2023-03-31: 300 mg via ORAL

## 2023-03-31 MED ORDER — SODIUM CHLORIDE 0.9 % IV SOLN: INTRAVENOUS | Status: DC | PRN

## 2023-03-31 MED ORDER — SODIUM CHLORIDE 0.9% FLUSH: 3.0000 mL | Freq: Two times a day (BID) | INTRAVENOUS | Status: DC

## 2023-03-31 MED ORDER — ACETAMINOPHEN 500 MG PO TABS
1000.0000 mg | ORAL_TABLET | ORAL | Status: AC
  Administered 2023-03-31: 1000 mg via ORAL

## 2023-03-31 MED ORDER — LIDOCAINE HCL (CARDIAC) PF 100 MG/5ML IV SOSY
PREFILLED_SYRINGE | INTRAVENOUS | Status: DC | PRN
  Administered 2023-03-31: 100 mg via INTRAVENOUS

## 2023-03-31 MED ORDER — OXYCODONE HCL 5 MG PO TABS: 5.0000 mg | ORAL_TABLET | Freq: Once | ORAL | Status: DC | PRN

## 2023-03-31 MED ORDER — FENTANYL CITRATE (PF) 100 MCG/2ML IJ SOLN: 25.0000 ug | INTRAMUSCULAR | Status: DC | PRN

## 2023-03-31 MED ORDER — CEFAZOLIN SODIUM-DEXTROSE 2-4 GM/100ML-% IV SOLN
INTRAVENOUS | Status: AC
  Filled 2023-03-31: qty 100

## 2023-03-31 MED ORDER — BUPIVACAINE-EPINEPHRINE (PF) 0.25% -1:200000 IJ SOLN
INTRAMUSCULAR | Status: AC
  Filled 2023-03-31: qty 30

## 2023-03-31 MED ORDER — BUPIVACAINE LIPOSOME 1.3 % IJ SUSP
INTRAMUSCULAR | Status: DC | PRN
  Administered 2023-03-31: 20 mL

## 2023-03-31 MED ORDER — GABAPENTIN 300 MG PO CAPS
ORAL_CAPSULE | ORAL | Status: AC
Start: 2023-03-31 — End: ?
  Filled 2023-03-31: qty 1

## 2023-03-31 MED ORDER — ACETAMINOPHEN 500 MG PO TABS
ORAL_TABLET | ORAL | Status: AC
  Filled 2023-03-31: qty 2

## 2023-03-31 MED ORDER — ONDANSETRON HCL 4 MG/2ML IJ SOLN
INTRAMUSCULAR | Status: DC | PRN
  Administered 2023-03-31: 4 mg via INTRAVENOUS

## 2023-03-31 MED ORDER — DEXMEDETOMIDINE HCL IN NACL 80 MCG/20ML IV SOLN
INTRAVENOUS | Status: DC | PRN
  Administered 2023-03-31: 8 ug via INTRAVENOUS

## 2023-03-31 MED ORDER — CEFAZOLIN SODIUM-DEXTROSE 2-4 GM/100ML-% IV SOLN
2.0000 g | INTRAVENOUS | Status: AC
  Administered 2023-03-31: 2 g via INTRAVENOUS

## 2023-03-31 SURGICAL SUPPLY — 36 items
BLADE CLIPPER SURG (BLADE) IMPLANT
CHLORAPREP W/TINT 26 (MISCELLANEOUS) ×1 IMPLANT
DERMABOND ADVANCED .7 DNX12 (GAUZE/BANDAGES/DRESSINGS) ×1 IMPLANT
ELECT CAUTERY BLADE TIP 2.5 (TIP) ×1 IMPLANT
ELECT REM PT RETURN 9FT ADLT (ELECTROSURGICAL) ×1 IMPLANT
ELECTRODE CAUTERY BLDE TIP 2.5 (TIP) ×1 IMPLANT
ELECTRODE REM PT RTRN 9FT ADLT (ELECTROSURGICAL) ×1 IMPLANT
GLOVE BIO SURGEON STRL SZ7 (GLOVE) ×1 IMPLANT
GOWN STRL REUS W/ TWL LRG LVL3 (GOWN DISPOSABLE) ×2 IMPLANT
GRASPER SUT TROCAR 14GX15 (MISCELLANEOUS) ×1 IMPLANT
KIT TURNOVER KIT A (KITS) ×1 IMPLANT
MANIFOLD NEPTUNE II (INSTRUMENTS) ×1 IMPLANT
NDL HYPO 22X1.5 SAFETY MO (MISCELLANEOUS) ×1 IMPLANT
NDL INSUFFLATION 14GA 120MM (NEEDLE) ×1 IMPLANT
NDL SAFETY ECLIPSE 18X1.5 (NEEDLE) ×1 IMPLANT
NEEDLE HYPO 22X1.5 SAFETY MO (MISCELLANEOUS) ×1 IMPLANT
NEEDLE INSUFFLATION 14GA 120MM (NEEDLE) IMPLANT
NS IRRIG 500ML POUR BTL (IV SOLUTION) ×1 IMPLANT
PACK LAP CHOLECYSTECTOMY (MISCELLANEOUS) ×1 IMPLANT
SET TUBE SMOKE EVAC HIGH FLOW (TUBING) ×1 IMPLANT
SLEEVE ADV FIXATION 5X100MM (TROCAR) ×1 IMPLANT
SPONGE T-LAP 18X18 ~~LOC~~+RFID (SPONGE) ×1 IMPLANT
SUT ETHILON 3-0 FS-10 30 BLK (SUTURE) IMPLANT
SUT MNCRL 4-0 27 PS-2 XMFL (SUTURE) ×1 IMPLANT
SUT VIC AB 3-0 SH 27X BRD (SUTURE) ×1 IMPLANT
SUT VICRYL 0 UR6 27IN ABS (SUTURE) ×2 IMPLANT
SUTURE EHLN 3-0 FS-10 30 BLK (SUTURE) IMPLANT
SUTURE MNCRL 4-0 27XMF (SUTURE) ×1 IMPLANT
SYR 20ML LL LF (SYRINGE) ×1 IMPLANT
SYR 3ML LL SCALE MARK (SYRINGE) ×1 IMPLANT
SYS KII FIOS ACCESS ABD 5X100 (TROCAR) IMPLANT
SYSTEM KII FIOS ACES ABD 5X100 (TROCAR) IMPLANT
TRAP FLUID SMOKE EVACUATOR (MISCELLANEOUS) ×1 IMPLANT
TROCAR BALLN GELPORT 12X130M (ENDOMECHANICALS) IMPLANT
TROCAR Z-THREAD FIOS 5X100MM (TROCAR) ×1 IMPLANT
WATER STERILE IRR 500ML POUR (IV SOLUTION) ×1 IMPLANT

## 2023-03-31 NOTE — Op Note (Signed)
 Romeval of peritoneal Dialysis catheter ( Total three cuffs)   Pre-operative Diagnosis: ESRD   Post-operative Diagnosis: same     Surgeon: [name], MD FACS   Anesthesia: Gen. with endotracheal tube      Findings:intact catheter, no infection  Estimated Blood Loss: 5cc              Complications: none     Procedure Details  The patient was seen again in the Holding Room. The benefits, complications, treatment options, and expected outcomes were discussed with the patient. The risks of bleeding, infection, recurrence of symptoms, failure to resolve symptoms, catheter malfunction bowel injury, any of which could require further surgery were reviewed with the patient. The likelihood of improving the patient's symptoms with return to their baseline status is good.  The patient and/or family concurred with the proposed plan, giving informed consent.  The patient was taken to Operating Room, identified and the procedure verified. A Time Out was held and the above information confirmed.   Prior to the induction of general anesthesia, antibiotic prophylaxis was administered. VTE prophylaxis was in place. General endotracheal anesthesia was then administered and tolerated well. After the induction, the abdomen was prepped with Chloraprep and draped in the sterile fashion. The patient was positioned in the supine position.   Periumbilical incision created to the left of the midline.   The anterior rectus fascia identified and incised, rectus muscle identified as well as the catheter, the catheter and its cuff was dissected free with cautery. Peritoneal portion was able to be pulled easily. Peritoneal fluid observed w/o infection. Attention was turned to the LUQ where another incision created and two cuffs were identified and dissected free using electrocautery. The catheter was pulled and was cut into three separate pieces so the cuffs I did not have to increase any incision. Catheter removed and  sent for gross path. All incisions  were infiltrated with a liposomal Marcaine. 4-0 subcuticular Monocryl was used to close the skin. Dermabond was  applied.  The patient was then extubated and brought to the recovery room in stable condition. Sponge, lap, and needle counts were correct at closure and at the conclusion of the case.               [name], MD, FACS

## 2023-03-31 NOTE — Anesthesia Procedure Notes (Signed)
 Procedure Name: Intubation Date/Time: 03/31/2023 9:49 AM  Performed by: Maryla Morrow., CRNAPre-anesthesia Checklist: Patient identified, Patient being monitored, Timeout performed, Emergency Drugs available and Suction available Patient Re-evaluated:Patient Re-evaluated prior to induction Oxygen Delivery Method: Circle system utilized Preoxygenation: Pre-oxygenation with 100% oxygen Induction Type: IV induction Ventilation: Mask ventilation without difficulty Laryngoscope Size: 3 and McGrath Grade View: Grade I Tube type: Oral Tube size: 6.5 mm Number of attempts: 1 Airway Equipment and Method: Stylet Placement Confirmation: ETT inserted through vocal cords under direct vision, positive ETCO2 and breath sounds checked- equal and bilateral Secured at: 19 cm Tube secured with: Tape Dental Injury: Teeth and Oropharynx as per pre-operative assessment

## 2023-03-31 NOTE — Transfer of Care (Signed)
 Immediate Anesthesia Transfer of Care Note  Patient: Heidi [name]  Procedure(s) Performed: OPEN REMOVAL CONTINUOUS AMBULATORY PERITONEAL DIALYSIS  (CAPD) CATHETER  Patient Location: PACU  Anesthesia Type:General  Level of Consciousness: awake, alert , and patient cooperative  Airway & Oxygen Therapy: Patient Spontanous Breathing and Patient connected to face mask oxygen  Post-op Assessment: Report given to RN and Post -op Vital signs reviewed and stable  Post vital signs: stable on oxygen via face mask  Last Vitals:  Vitals Value Taken Time  BP 144/77 03/31/23 1041  Temp    Pulse 79 03/31/23 1043  Resp 26 03/31/23 1043  SpO2 100 % 03/31/23 1043  Vitals shown include unfiled device data.  Last Pain:  Vitals:   03/31/23 0745  TempSrc: Oral  PainSc: 0-No pain         Complications: No notable events documented.

## 2023-03-31 NOTE — Interval H&P Note (Signed)
 History and Physical Interval Note:  03/31/2023 9:08 AM  Heidi [name]  has presented today for surgery, with the diagnosis of ESRD.  The various methods of treatment have been discussed with the patient and family. After consideration of risks, benefits and other options for treatment, the patient has consented to  Procedure(s): LAPAROSCOPIC REMOVAL CONTINUOUS AMBULATORY PERITONEAL DIALYSIS  (CAPD) CATHETER (N/A) as a surgical intervention.  The patient's history has been reviewed, patient examined, no change in status, stable for surgery.  I have reviewed the patient's chart and labs.  Questions were answered to the patient's satisfaction.     [name] F Michaeal Davis

## 2023-03-31 NOTE — Anesthesia Postprocedure Evaluation (Signed)
 Anesthesia Post Note  Patient: Heidi [name]  Procedure(s) Performed: OPEN REMOVAL CONTINUOUS AMBULATORY PERITONEAL DIALYSIS  (CAPD) CATHETER  Patient location during evaluation: Endoscopy Anesthesia Type: General Level of consciousness: awake and alert Pain management: pain level controlled Vital Signs Assessment: post-procedure vital signs reviewed and stable Respiratory status: spontaneous breathing, nonlabored ventilation, respiratory function stable and patient connected to nasal cannula oxygen Cardiovascular status: blood pressure returned to baseline and stable Postop Assessment: no apparent nausea or vomiting Anesthetic complications: no  There were no known notable events for this encounter.   Last Vitals:  Vitals:   03/31/23 1104 03/31/23 1115  BP: (!) 150/76 (!) 146/87  Pulse: 81 85  Resp: 15 18  Temp: (!) 36.3 C 36.6 C  SpO2: 96% 95%    Last Pain:  Vitals:   03/31/23 1115  TempSrc: Temporal  PainSc: 0-No pain                 Stephanie Coup

## 2023-03-31 NOTE — Discharge Instructions (Addendum)
 Sutures, Staples, or Adhesive Wound Closure Doctors use stitches (sutures), staples, skin glue (tissue adhesive), and skin tape (adhesive strips) to hold your skin together while it heals (wound closure). What your doctor will use depends on your wound. Your doctor also may use more than one way to close your wound. In most cases, your wound will be closed right away (primary skin closure). Sometimes, it may be closed later so that it can be cleaned and then heal on its own (delayed wound closure). What are the types of wound closure? Skin glue To use skin glue, your doctor will: Hold the edges of the wound together. Paint the glue onto your skin. You may need more than one layer. Cover your wound with a bandage (dressing) after the glue is dry. Skin glue may be used for: Small wounds that are not deep (superficial wounds). Wounds on the face. Children's wounds. Skin glue is not used inside of wounds, or on wounds that are: Deep. Uneven. Bleeding. Some benefits of skin glue are: It leaves nothing that needs to be taken off. You do not need medicine to numb the area. You have less pain than with other types of closure. Skin tape Skin tape is: Made of paper that is sticky (adhesive) and has many small holes in it. Placed across your wound edges like a normal bandage. Used to close very shallow wounds. Sometimes used with sutures to help improve closure. Sutures Sutures come in many different materials, strengths, and sizes. Some sutures break down as your wound heals (absorbable). Other sutures need to be taken out (nonabsorbable). To use sutures, your doctor will: Sew your skin together with sutures and a needle. Use one long stitch or separate stitches. Tie and cut the sutures at the end. Sutures can be used for all types of wounds, including under the skin. They can cause a skin reaction that can lead to infection. Staples Staples are often used to close cuts from surgery  (incisions). To use staples, your doctor will: Hold the edges of your wound close together. Place a staple across the wound. Use a tool to secure the staple to the skin. Repeat this with as many staples as needed. Staples are faster to use than sutures, and they cause less reaction from your skin. Staples need to be taken out using a tool that bends the staples away from your skin. Follow these instructions at home: Medicines Take over-the-counter and prescription medicines only as told by your doctor. If you were prescribed an antibiotic medicine, take it as told by your doctor. Do not stop taking it even if you start to feel better. Wound care  Follow instructions from your doctor about how to take care of your wound and bandage. Wash your hands with soap and water for at least 20 seconds before and after touching your wound or bandage. If you cannot use soap and water, use hand sanitizer. Do not try to take off or take out your wound closures unless your doctor tells you to do that. You may need a follow-up visit for your doctor to take out your closures. Closures may stay in place for 2 weeks or longer. Absorbable sutures may break down after a few days or weeks. If skin tape edges start to loosen and curl up, you may trim the loose edges. Do not pick at your wound. Picking can cause an infection or cause your wound to open up again. Apply ointments or creams only as told by your doctor.  Check your wound every day for signs of infection. Check for: Redness, swelling, or pain. Fluid or blood. New warmth, a rash, or hardness at the wound site. Pus or a bad smell. General instructions Do not take baths, swim, or use a hot tub. Ask your doctor about taking showers or sponge baths. Do not soak your wound in water. Eat foods that include protein, vitamin A, and vitamin C. These nutrients help your wound heal. Drink enough fluid to keep your pee (urine) pale yellow. Keep all follow-up  visits. Contact a doctor if: You have a fever or chills. You have redness, swelling, or pain around your wound. You have fluid or blood coming from your wound. You have new warmth, a rash, or hardness around your wound. You see that your wound becomes thick, raised, and darker in color after your sutures come out (scarring). Get help right away if: The edges of your wound start to separate. Your wound opens up again. You notice pus or a bad smell coming from your wound. Summary What your doctor uses to hold your skin together while it heals (wound closure) depends on your wound. Your doctor may use stitches (sutures), staples, skin glue (tissue adhesive), or skin tape (adhesive strips). Do not try to take off or take out your wound closures unless your doctor tells you to do that. Do not soak your wound in water. This information is not intended to replace advice given to you by your health care provider. Make sure you discuss any questions you have with your health care provider. Document Revised: 06/01/2020 Document Reviewed: 06/01/2020 Elsevier Patient Education  2024 [employer].

## 2023-03-31 NOTE — Anesthesia Preprocedure Evaluation (Signed)
 Anesthesia Evaluation  Patient identified by MRN, date of birth, ID band Patient awake    Reviewed: Allergy & Precautions, NPO status , Patient's Chart, lab work & pertinent test results  History of Anesthesia Complications Negative for: history of anesthetic complications  Airway Mallampati: III  TM Distance: <3 FB Neck ROM: full    Dental  (+) Chipped   Pulmonary asthma , sleep apnea    Pulmonary exam normal        Cardiovascular Exercise Tolerance: Good hypertension, (-) angina (-) Past MI Normal cardiovascular exam     Neuro/Psych negative neurological ROS  negative psych ROS   GI/Hepatic Neg liver ROS,GERD  Controlled,,  Endo/Other  diabetes, Type 2, Insulin Dependent    Renal/GU DialysisRenal disease     Musculoskeletal   Abdominal   Peds  Hematology negative hematology ROS (+)   Anesthesia Other Findings Past Medical History: No date: Allergic rhinitis No date: Anemia of chronic kidney failure, stage 5 (HCC) No date: Arthritis     Comment:  sacroiliac joint No date: Asthma 09/05/2014: Breast mass in female 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 No date: Diabetes mellitus without complication (HCC) 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, congenital No date: GERD (gastroesophageal reflux disease) No date: Heart murmur No date: Hyperlipidemia No date: Hypertension No date: 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)     Comment:  Dialysis No date: Hyponatremia No date: Microalbuminuria No date: Peritoneal dialysis catheter in situ (HCC) No date: Pes planus No date: Pneumonia     Comment:  as a child No date: Renal disorder     Comment:  CKD stage 3 No date: Sleep apnea No date: Tricompartment degenerative joint disease of knee No date: Trochanteric bursitis of both hips No date: Volvulus (HCC)  Past Surgical History: No date: ABDOMINAL  HYSTERECTOMY 07/24/2014: BREAST BIOPSY; Left     Comment:  PASH 1996: BREAST BIOPSY; Right     Comment:  benign 07/13/2022: BREAST BIOPSY; Right     Comment:  stereo biopsy/ x clip/ fat necrosis 07/13/2022: BREAST BIOPSY; Right     Comment:  MM RT BREAST BX W LOC DEV 1ST LESION IMAGE BX SPEC               STEREO GUIDE 07/13/2022 [facility] 02/27/2023: BREAST BIOPSY; Right     Comment:  Stereo Bx Coil clip- path pending 02/27/2023: BREAST BIOPSY; Right     Comment:  Stereo Bx, ribbon clip- path pending 02/27/2023: BREAST BIOPSY; Right     Comment:  MM RT BREAST BX W LOC DEV 1ST LESION IMAGE BX SPEC               STEREO GUIDE 02/27/2023 [facility] 02/27/2023: BREAST BIOPSY; Right     Comment:  MM RT BREAST BX W LOC DEV EA AD LESION IMG BX SPEC               STEREO GUIDE 02/27/2023 [facility] 2016: BREAST EXCISIONAL BIOPSY; Right 02/2021: broken ankle surgery; Right 10/07/2021: CAPD INSERTION; N/A     Comment:  Procedure: LAPAROSCOPIC INSERTION CONTINUOUS AMBULATORY               PERITONEAL DIALYSIS  (CAPD) CATHETER;  Surgeon: Leafy Ro, MD;  Location: [facility];  Service: General;  Laterality: N/A; No date: COLONOSCOPY 12/19/2019: COLONOSCOPY; N/A     Comment:  Procedure: COLONOSCOPY;  Surgeon: Toledo, Boykin Nearing, MD;              Location: [facility];  Service: Gastroenterology;                Laterality: N/A; 01/30/2023: DIALYSIS/PERMA CATHETER INSERTION; N/A     Comment:  Procedure: DIALYSIS/PERMA CATHETER INSERTION;  Surgeon:               Annice Needy, MD;  Location: [facility];                Service: Cardiovascular;  Laterality: N/A; No date: EXPLORATORY LAPAROTOMY     Comment:  of colon for torsion 10/07/2021: INSERTION OF MESH     Comment:  Procedure: INSERTION OF MESH;  Surgeon: Leafy Ro,               MD;  Location: [facility];  Service: General;; No date: NASAL FRACTURE SURGERY 10/07/2021: UMBILICAL  HERNIA REPAIR; N/A     Comment:  Procedure: HERNIA REPAIR UMBILICAL ADULT;  Surgeon:               Leafy Ro, MD;  Location: [facility];  Service:               General;  Laterality: N/A;  BMI    Body Mass Index: 30.90 kg/m      Reproductive/Obstetrics negative OB ROS                             Anesthesia Physical Anesthesia Plan  ASA: 4  Anesthesia Plan: General ETT   Post-op Pain Management:    Induction: Intravenous  PONV Risk Score and Plan: Ondansetron, Dexamethasone, Midazolam and Treatment may vary due to age or medical condition  Airway Management Planned: Oral ETT  Additional Equipment:   Intra-op Plan:   Post-operative Plan: Extubation in OR  Informed Consent: I have reviewed the patients History and Physical, chart, labs and discussed the procedure including the risks, benefits and alternatives for the proposed anesthesia with the patient or authorized representative who has indicated his/her understanding and acceptance.     Dental Advisory Given  Plan Discussed with: Anesthesiologist, CRNA and Surgeon  Anesthesia Plan Comments: (Patient consented for risks of anesthesia including but not limited to:  - adverse reactions to medications - damage to eyes, teeth, lips or other oral mucosa - nerve damage due to positioning  - sore throat or hoarseness - Damage to heart, brain, nerves, lungs, other parts of body or loss of life  Patient voiced understanding and assent.)       Anesthesia Quick Evaluation

## 2023-04-01 ENCOUNTER — Encounter: Payer: Self-pay | Admitting: Surgery

## 2023-04-03 ENCOUNTER — Inpatient Hospital Stay: Payer: Medicare HMO | Attending: Internal Medicine

## 2023-04-03 DIAGNOSIS — D631 Anemia in chronic kidney disease: Secondary | ICD-10-CM | POA: Insufficient documentation

## 2023-04-03 DIAGNOSIS — D649 Anemia, unspecified: Secondary | ICD-10-CM

## 2023-04-03 DIAGNOSIS — N186 End stage renal disease: Secondary | ICD-10-CM | POA: Insufficient documentation

## 2023-04-03 DIAGNOSIS — I120 Hypertensive chronic kidney disease with stage 5 chronic kidney disease or end stage renal disease: Secondary | ICD-10-CM | POA: Insufficient documentation

## 2023-04-03 DIAGNOSIS — N189 Chronic kidney disease, unspecified: Secondary | ICD-10-CM

## 2023-04-03 LAB — SAMPLE TO BLOOD BANK

## 2023-04-03 LAB — CBC WITH DIFFERENTIAL (CANCER CENTER ONLY)
Abs Immature Granulocytes: 0.02 10*3/uL (ref 0.00–0.07)
Basophils Absolute: 0.1 10*3/uL (ref 0.0–0.1)
Basophils Relative: 1 %
Eosinophils Absolute: 0.1 10*3/uL (ref 0.0–0.5)
Eosinophils Relative: 2 %
HCT: 27 % — ABNORMAL LOW (ref 36.0–46.0)
Hemoglobin: 8.5 g/dL — ABNORMAL LOW (ref 12.0–15.0)
Immature Granulocytes: 0 %
Lymphocytes Relative: 18 %
Lymphs Abs: 1.1 10*3/uL (ref 0.7–4.0)
MCH: 28.4 pg (ref 26.0–34.0)
MCHC: 31.5 g/dL (ref 30.0–36.0)
MCV: 90.3 fL (ref 80.0–100.0)
Monocytes Absolute: 0.6 10*3/uL (ref 0.1–1.0)
Monocytes Relative: 10 %
Neutro Abs: 4.2 10*3/uL (ref 1.7–7.7)
Neutrophils Relative %: 69 %
Platelet Count: 219 10*3/uL (ref 150–400)
RBC: 2.99 MIL/uL — ABNORMAL LOW (ref 3.87–5.11)
RDW: 15.1 % (ref 11.5–15.5)
WBC Count: 6 10*3/uL (ref 4.0–10.5)
nRBC: 0 % (ref 0.0–0.2)

## 2023-04-05 ENCOUNTER — Inpatient Hospital Stay: Payer: Medicare HMO

## 2023-04-10 ENCOUNTER — Other Ambulatory Visit (INDEPENDENT_AMBULATORY_CARE_PROVIDER_SITE_OTHER): Payer: Self-pay | Admitting: Nurse Practitioner

## 2023-04-10 DIAGNOSIS — N186 End stage renal disease: Secondary | ICD-10-CM

## 2023-04-12 ENCOUNTER — Ambulatory Visit (INDEPENDENT_AMBULATORY_CARE_PROVIDER_SITE_OTHER): Payer: Medicare HMO

## 2023-04-12 ENCOUNTER — Ambulatory Visit (INDEPENDENT_AMBULATORY_CARE_PROVIDER_SITE_OTHER): Payer: Medicare HMO | Admitting: Nurse Practitioner

## 2023-04-12 ENCOUNTER — Encounter (INDEPENDENT_AMBULATORY_CARE_PROVIDER_SITE_OTHER): Payer: Self-pay | Admitting: Nurse Practitioner

## 2023-04-12 VITALS — BP 174/78 | HR 89 | Resp 18 | Ht 64.0 in | Wt 180.0 lb

## 2023-04-12 DIAGNOSIS — N1831 Chronic kidney disease, stage 3a: Secondary | ICD-10-CM | POA: Diagnosis not present

## 2023-04-12 DIAGNOSIS — Z794 Long term (current) use of insulin: Secondary | ICD-10-CM

## 2023-04-12 DIAGNOSIS — N186 End stage renal disease: Secondary | ICD-10-CM

## 2023-04-12 DIAGNOSIS — I10 Essential (primary) hypertension: Secondary | ICD-10-CM | POA: Diagnosis not present

## 2023-04-12 DIAGNOSIS — I120 Hypertensive chronic kidney disease with stage 5 chronic kidney disease or end stage renal disease: Secondary | ICD-10-CM

## 2023-04-12 DIAGNOSIS — E1122 Type 2 diabetes mellitus with diabetic chronic kidney disease: Secondary | ICD-10-CM | POA: Diagnosis not present

## 2023-04-13 ENCOUNTER — Telehealth (INDEPENDENT_AMBULATORY_CARE_PROVIDER_SITE_OTHER): Payer: Self-pay

## 2023-04-13 NOTE — Telephone Encounter (Signed)
 Spoke with the patient and she is scheduled with Dr. Wyn Quaker on 04/20/23 for a left brachial axillary graft at the [facility]. Pre-op is a phone call on 04/17/23 between 8-1 pm. Pre-surgical instructions were discussed and will be sent to Mychart and mailed.

## 2023-04-17 ENCOUNTER — Telehealth (INDEPENDENT_AMBULATORY_CARE_PROVIDER_SITE_OTHER): Payer: Self-pay

## 2023-04-17 ENCOUNTER — Encounter
Admission: RE | Admit: 2023-04-17 | Discharge: 2023-04-17 | Disposition: A | Discharge status: Home / Self Care | Source: Ambulatory Visit | Attending: Vascular Surgery | Admitting: Vascular Surgery

## 2023-04-17 ENCOUNTER — Other Ambulatory Visit (INDEPENDENT_AMBULATORY_CARE_PROVIDER_SITE_OTHER): Payer: Self-pay | Admitting: Nurse Practitioner

## 2023-04-17 DIAGNOSIS — N186 End stage renal disease: Secondary | ICD-10-CM

## 2023-04-17 DIAGNOSIS — Z01818 Encounter for other preprocedural examination: Secondary | ICD-10-CM

## 2023-04-17 DIAGNOSIS — Z992 Dependence on renal dialysis: Secondary | ICD-10-CM | POA: Insufficient documentation

## 2023-04-17 DIAGNOSIS — E1122 Type 2 diabetes mellitus with diabetic chronic kidney disease: Secondary | ICD-10-CM | POA: Diagnosis not present

## 2023-04-17 DIAGNOSIS — Z01812 Encounter for preprocedural laboratory examination: Secondary | ICD-10-CM | POA: Diagnosis present

## 2023-04-17 DIAGNOSIS — I120 Hypertensive chronic kidney disease with stage 5 chronic kidney disease or end stage renal disease: Secondary | ICD-10-CM | POA: Diagnosis not present

## 2023-04-17 DIAGNOSIS — Z794 Long term (current) use of insulin: Secondary | ICD-10-CM | POA: Diagnosis not present

## 2023-04-17 HISTORY — DX: Other specified abnormal immunological findings in serum: R76.8

## 2023-04-17 HISTORY — DX: Morbid (severe) obesity due to excess calories: E66.01

## 2023-04-17 HISTORY — DX: Gram-negative sepsis, unspecified: N39.0

## 2023-04-17 HISTORY — DX: Secondary hyperparathyroidism of renal origin: N25.81

## 2023-04-17 HISTORY — DX: End stage renal disease: N18.6

## 2023-04-17 HISTORY — DX: Gram-negative sepsis, unspecified: A41.50

## 2023-04-17 HISTORY — DX: Dependence on renal dialysis: Z99.2

## 2023-04-17 HISTORY — DX: Vitamin D deficiency, unspecified: E55.9

## 2023-04-17 NOTE — H&P (View-Only) (Signed)
 Subjective:    Patient ID: Heidi [name], female    DOB: 16-Oct-1955, 68 y.o.   MRN: [id number] Chief Complaint  Patient presents with   New Patient (Initial Visit)    np. vein mapping + UE Art Duplex + consult see JD/FB. ESRD. Ref: Mady Haagensen    The patient presents today for evaluation for permanent dialysis access.  She was previously on peritoneal dialysis and recently transition to hemodialysis.  She is currently maintained via PermCath without any significant issues. The patient is followed by nephrology.    The patient is right-handed.  The patient has been considering the various methods of dialysis and wishes to proceed with hemodialysis and therefore creation of AV access is indicated.  No recent shortening of the patient's walking distance or new symptoms consistent with claudication.  No history of rest pain symptoms. No new ulcers or wounds of the lower extremities have occurred.  The patient denies amaurosis fugax or recent TIA symptoms. There are no recent neurological changes noted. There is no history of DVT, PE or superficial thrombophlebitis. No recent episodes of angina or shortness of breath documented.     Review of Systems  All other systems reviewed and are negative.      Objective:   Physical Exam Vitals reviewed.  HENT:     Head: Normocephalic.  Cardiovascular:     Rate and Rhythm: Normal rate.     Pulses: Normal pulses.  Pulmonary:     Effort: Pulmonary effort is normal.  Skin:    General: Skin is warm and dry.  Neurological:     Mental Status: She is alert and oriented to person, place, and time.  Psychiatric:        Mood and Affect: Mood normal.        Behavior: Behavior normal.        Thought Content: Thought content normal.        Judgment: Judgment normal.     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)    Dialysis   Hyponatremia    Microalbuminuria    Peritoneal dialysis catheter in situ (HCC)    Pes planus    Pneumonia    as a child   Renal disorder    CKD stage 3   Sleep apnea    Tricompartment degenerative joint disease of knee    Trochanteric bursitis of both hips    Volvulus (HCC)     Social History   Socioeconomic History   Marital status: Single    Spouse name: Not on file   Number of children: Not on file   Years of education: Not on file   Highest education level: Not on file  Occupational History   Not on file  Tobacco Use   Smoking status: Never    Passive exposure: Never   Smokeless tobacco: Never  Vaping Use   Vaping status: Never Used  Substance and Sexual Activity   Alcohol use: Yes    Comment: rarely   Drug use: No   Sexual activity: Not on file  Other Topics Concern   Not on file  Social History Narrative   Lives at home with son   Social Drivers of Health   Financial Resource Strain: Low Risk  (02/06/2023)   Received from [facility]   Overall Financial Resource Strain (CARDIA)    Difficulty of Paying Living Expenses: Not hard at all  Food Insecurity: No Food Insecurity (02/06/2023)   Received from [facility]   Hunger Vital Sign    Ran Out of Food in the Last Year: Never true    Worried About Running Out of Food in the Last Year: Never true  Transportation Needs: No Transportation Needs (02/06/2023)   Received from [facility]   PRAPARE -  Transportation    Lack of Transportation (Non-Medical): No    In the past 12 months, has lack of transportation kept you from medical appointments or from getting medications?: No  Physical Activity: Inactive (03/10/2021)   Received from [facility], [facility]   Exercise Vital Sign    Days of Exercise per Week: 0 days    Minutes of Exercise per Session: 0 min  Stress: No Stress Concern Present (03/10/2021)   Received from [facility], [facility]   [employer] of Occupational Health - Occupational Stress Questionnaire    Feeling of Stress : Only a little  Social Connections: Not on file  Intimate Partner Violence: Not At Risk (01/24/2023)   Humiliation, Afraid, Rape, and Kick questionnaire    Fear of Current or Ex-Partner: No    Emotionally Abused: No    Physically Abused: No    Sexually Abused: No    Past Surgical History:  Procedure Laterality Date   ABDOMINAL HYSTERECTOMY     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   BREAST BIOPSY Right 1996   benign   BREAST BIOPSY Right 07/13/2022   stereo biopsy/ x clip/ fat necrosis   BREAST BIOPSY Right 07/13/2022   MM RT BREAST BX W LOC DEV 1ST LESION IMAGE BX SPEC STEREO GUIDE 07/13/2022 [facility]   BREAST BIOPSY Right 02/27/2023   Stereo Bx Coil clip- path pending   BREAST BIOPSY Right 02/27/2023   Stereo Bx, ribbon clip- path pending   BREAST BIOPSY Right 02/27/2023   MM RT BREAST BX W LOC DEV 1ST LESION IMAGE BX SPEC STEREO GUIDE 02/27/2023 [facility]   BREAST BIOPSY Right 02/27/2023   MM RT BREAST BX W LOC DEV EA AD LESION IMG BX SPEC STEREO GUIDE 02/27/2023 [facility]   BREAST EXCISIONAL BIOPSY Right 2016   broken ankle surgery Right 02/2021   CAPD INSERTION N/A 10/07/2021   Procedure: LAPAROSCOPIC INSERTION CONTINUOUS AMBULATORY PERITONEAL DIALYSIS  (CAPD) CATHETER;  Surgeon: Leafy Ro, MD;  Location: [facility];  Service: General;   Laterality: N/A;   CAPD REMOVAL N/A 03/31/2023   Procedure: OPEN REMOVAL CONTINUOUS AMBULATORY PERITONEAL DIALYSIS  (CAPD) CATHETER;  Surgeon: Leafy Ro, MD;  Location: [facility];  Service: General;  Laterality: N/A;   COLONOSCOPY     COLONOSCOPY N/A 12/19/2019   Procedure: COLONOSCOPY;  Surgeon: Norma Fredrickson, Boykin Nearing, MD;  Location:  [facility];  Service: Gastroenterology;  Laterality: N/A;   DIALYSIS/PERMA CATHETER INSERTION N/A 01/30/2023   Procedure: DIALYSIS/PERMA CATHETER INSERTION;  Surgeon: Annice Needy, MD;  Location: [facility];  Service: Cardiovascular;  Laterality: N/A;   EXPLORATORY LAPAROTOMY     of colon for torsion   INSERTION OF MESH  10/07/2021   Procedure: INSERTION OF MESH;  Surgeon: Leafy Ro, MD;  Location: [facility];  Service: General;;   NASAL FRACTURE SURGERY     UMBILICAL HERNIA REPAIR N/A 10/07/2021   Procedure: HERNIA REPAIR UMBILICAL ADULT;  Surgeon: Leafy Ro, MD;  Location: [facility];  Service: General;  Laterality: N/A;    Family History  Problem Relation Age of Onset   Diabetes Mother    Diabetes Father    Cancer Father    Breast cancer Maternal Aunt     Allergies  Allergen Reactions   Sulfa Antibiotics Nausea And Vomiting   Wound Dressing Adhesive     Site around PD catheter is irritated from dressing   Misc. Sulfonamide Containing Compounds Nausea And Vomiting       Latest Ref Rng & Units 04/03/2023   10:01 AM 03/31/2023    7:46 AM 02/28/2023   10:16 AM  CBC  WBC 4.0 - 10.5 K/uL 6.0  6.9  9.7   Hemoglobin 12.0 - 15.0 g/dL 8.5  9.0  7.3   Hematocrit 36.0 - 46.0 % 27.0  28.1  22.9   Platelets 150 - 400 K/uL 219  255  244       CMP     Component Value Date/Time   NA 137 03/31/2023 0746   K 3.7 03/31/2023 0746   CL 99 03/31/2023 0746   CO2 27 03/31/2023 0746   GLUCOSE 173 (H) 03/31/2023 0746   BUN 57 (H) 03/31/2023 0746   CREATININE 8.19 (H) 03/31/2023 0746   CALCIUM 9.6 03/31/2023 0746   PROT 5.3 (L) 01/24/2023  0445   ALBUMIN 1.9 (L) 01/24/2023 0445   AST 16 01/24/2023 0445   ALT 25 01/24/2023 0445   ALKPHOS 61 01/24/2023 0445   BILITOT 0.3 01/24/2023 0445   GFRNONAA 5 (L) 03/31/2023 0746     No results found.     Assessment & Plan:   1. 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  The risks, benefits and alternative therapies were reviewed in detail with the patient.  All questions were answered.  The patient agrees to proceed with surgery.   The patient will follow up with me in the office after the surgery.  2. Essential hypertension Continue antihypertensive medications as already ordered, these medications have been reviewed and there are no changes at this time.  3. Type 2 diabetes mellitus with stage 3a chronic kidney disease, with long-term current use of insulin (HCC) Continue hypoglycemic medications as already ordered, these medications have been reviewed and there are no changes at this time.  Hgb A1C to be monitored as already arranged by primary service   Current Outpatient Medications on File Prior to Visit  Medication Sig Dispense Refill   albuterol (VENTOLIN HFA) 108 (90 Base) MCG/ACT inhaler Inhale 2 puffs into the lungs every 4 (four) hours as needed for wheezing or shortness of breath.     amLODipine (NORVASC) 10 MG tablet  Take 10 mg by mouth daily.     calcitRIOL (ROCALTROL) 0.25 MCG capsule Take 0.25 mcg by mouth daily.     carvedilol (COREG) 25 MG tablet Take 25 mg by mouth 2 (two) times daily with a meal.     cloNIDine (CATAPRES) 0.1 MG tablet Take 0.1 mg by mouth 2 (two) times daily.     cyanocobalamin 1000 MCG tablet Take 1 tablet (1,000 mcg total) by mouth daily. 30 tablet 2   fluticasone (FLONASE) 50 MCG/ACT nasal spray Place 2 sprays  into both nostrils daily.     folic acid (FOLVITE) 1 MG tablet Take 1 tablet (1 mg total) by mouth daily. 30 tablet 2   furosemide (LASIX) 80 MG tablet Take 1 tablet (80 mg total) by mouth daily. 30 tablet 2   hydrALAZINE (APRESOLINE) 25 MG tablet Take 25 mg by mouth 2 times daily at 12 noon and 4 pm.     HYDROcodone-acetaminophen (NORCO/VICODIN) 5-325 MG tablet Take 1 tablet by mouth every 6 (six) hours as needed for moderate pain (pain score 4-6). 15 tablet 0   Lancets (ONETOUCH DELICA PLUS LANCET30G) MISC      losartan (COZAAR) 100 MG tablet Take 100 mg by mouth daily.     lovastatin (MEVACOR) 20 MG tablet Take 20 mg by mouth daily.     NOVOLIN 70/30 (70-30) 100 UNIT/ML injection Inject 15 Units into the skin 2 (two) times daily with a meal. Reduced from 20 units twice daily.     PRECISION QID TEST test strip 1 each (1 strip total) 3 (three) times daily Use as instructed.     Vitamin D, Ergocalciferol, (DRISDOL) 1.25 MG (50000 UNIT) CAPS capsule Take 50,000 Units by mouth every 7 (seven) days.     No current facility-administered medications on file prior to visit.    There are no Patient Instructions on file for this visit. No follow-ups on file.   Georgiana Spinner, NP

## 2023-04-17 NOTE — Patient Instructions (Addendum)
 Your procedure is scheduled on:04-20-23 Thursday Report to the Registration Desk on the 1st floor of the [facility].Then proceed to the 2nd floor Surgery Desk To find out your arrival time, please call [phone] between 1PM - 3PM on:04-19-23 Wednesday If your arrival time is 6:00 am, do not arrive before that time as the [facility] Mall entrance doors do not open until 6:00 am.  REMEMBER: Instructions that are not followed completely may result in serious medical risk, up to and including death; or upon the discretion of your surgeon and anesthesiologist your surgery may need to be rescheduled.  Do not eat food OR drink any liquids after midnight the night before surgery.  No gum chewing or hard candies.  One week prior to surgery:Stop NOW (04-17-23) Stop Anti-inflammatories (NSAIDS) such as Advil, Aleve, Ibuprofen, Motrin, Naproxen, Naprosyn and Aspirin based products such as Excedrin, Goody's Powder, BC Powder. Stop ANY OVER THE COUNTER supplements until after surgery (Vitamin B12, Folic Acid)  You may however, continue to take Tylenol if needed for pain up until the day of surgery.  Continue taking all of your other prescription medications up until the day of surgery.  ON THE DAY OF SURGERY ONLY TAKE THESE MEDICATIONS WITH SIPS OF WATER: -amLODipine (NORVASC)  -carvedilol (COREG)  -cloNIDine (CATAPRES)  -hydrALAZINE (APRESOLINE)   Use your Albuterol Inhaler the day of surgery and bring your Inhaler to the hospital  Do NOT take any Insulin the morning of surgery  No Alcohol for 24 hours before or after surgery.  No Smoking including e-cigarettes for 24 hours before surgery.  No chewable tobacco products for at least 6 hours before surgery.  No nicotine patches on the day of surgery.  Do not use any "recreational" drugs for at least a week (preferably 2 weeks) before your surgery.  Please be advised that the combination of cocaine and anesthesia may have negative outcomes, up  to and including death. If you test positive for cocaine, your surgery will be cancelled.  On the morning of surgery brush your teeth with toothpaste and water, you may rinse your mouth with mouthwash if you wish. Do not swallow any toothpaste or mouthwash.  Use CHG wipes as directed on instruction sheet.  Do not wear jewelry, make-up, hairpins, clips or nail polish.  For welded (permanent) jewelry: bracelets, anklets, waist bands, etc.  Please have this removed prior to surgery.  If it is not removed, there is a chance that hospital personnel will need to cut it off on the day of surgery.  Do not wear lotions, powders, or perfumes.   Do not shave body hair from the neck down 48 hours before surgery.  Contact lenses, hearing aids and dentures may not be worn into surgery.  Do not bring valuables to the hospital. [facility] is not responsible for any missing/lost belongings or valuables.   Notify your doctor if there is any change in your medical condition (cold, fever, infection).  Wear comfortable clothing (specific to your surgery type) to the hospital.  After surgery, you can help prevent lung complications by doing breathing exercises.  Take deep breaths and cough every 1-2 hours. Your doctor may order a device called an Incentive Spirometer to help you take deep breaths. When coughing or sneezing, hold a pillow firmly against your incision with both hands. This is called "splinting." Doing this helps protect your incision. It also decreases belly discomfort.  If you are being admitted to the hospital overnight, leave your suitcase in  the car. After surgery it may be brought to your room.  In case of increased patient census, it may be necessary for you, the patient, to continue your postoperative care in the Same Day Surgery department.  If you are being discharged the day of surgery, you will not be allowed to drive home. You will need a responsible individual to drive you  home and stay with you for 24 hours after surgery.   If you are taking public transportation, you will need to have a responsible individual with you.  Please call the Pre-admissions Testing Dept. at [phone] if you have any questions about these instructions.  Surgery Visitation Policy:  Patients having surgery or a procedure may have two visitors.  Children under the age of 60 must have an adult with them who is not the patient.  Temporary Visitor Restrictions Due to increasing cases of flu, RSV and COVID-19: Children ages 58 and under will not be able to visit patients in [facility] hospitals under most circumstances.   Preparing the Skin Before Surgery     To help prevent the risk of infection at your surgical site, we are now providing you with rinse-free Sage 2% Chlorhexidine Gluconate (CHG) disposable wipes.  Chlorhexidine Gluconate (CHG) Soap  o An antiseptic cleaner that kills germs and bonds with the skin to continue killing germs even after washing  o Used for showering the night before surgery and morning of surgery  The night before surgery: Shower or bathe with warm water. Do not apply perfume, lotions, powders. Wait one hour after shower. Skin should be dry and cool. Open Sage wipe package - use 6 disposable cloths. Wipe body using one cloth for the right arm, one cloth for the left arm, one cloth for the right leg, one cloth for the left leg, one cloth for the chest/abdomen area, and one cloth for the back. Do not use on open wounds or sores. Do not use on face or genitals (private parts). If you are breast feeding, do not use on breasts. 5. Do not rinse, allow to dry. 6. Skin may feel "tacky" for several minutes. 7. Dress in clean clothes. 8. Place clean sheets on your bed and do not sleep with pets.  REPEAT ABOVE ON THE MORNING OF SURGERY BEFORE ARRIVING TO THE HOSPITAL.

## 2023-04-17 NOTE — Telephone Encounter (Signed)
 Patient had left a message stating she was reading the paperwork that she received regarding her surgery and it states that she would have a phone call between 8-1 pm. Patient stated she didn't feel like she could make it to that appt. I attempted to call the patient to let her know that it would be a phone call to her and she did not have to go anywhere. A message was left for a return call.

## 2023-04-17 NOTE — Progress Notes (Signed)
 Subjective:    Patient ID: Heidi [name], female    DOB: 16-Oct-1955, 68 y.o.   MRN: [id number] Chief Complaint  Patient presents with   New Patient (Initial Visit)    np. vein mapping + UE Art Duplex + consult see JD/FB. ESRD. Ref: Mady Haagensen    The patient presents today for evaluation for permanent dialysis access.  She was previously on peritoneal dialysis and recently transition to hemodialysis.  She is currently maintained via PermCath without any significant issues. The patient is followed by nephrology.    The patient is right-handed.  The patient has been considering the various methods of dialysis and wishes to proceed with hemodialysis and therefore creation of AV access is indicated.  No recent shortening of the patient's walking distance or new symptoms consistent with claudication.  No history of rest pain symptoms. No new ulcers or wounds of the lower extremities have occurred.  The patient denies amaurosis fugax or recent TIA symptoms. There are no recent neurological changes noted. There is no history of DVT, PE or superficial thrombophlebitis. No recent episodes of angina or shortness of breath documented.     Review of Systems  All other systems reviewed and are negative.      Objective:   Physical Exam Vitals reviewed.  HENT:     Head: Normocephalic.  Cardiovascular:     Rate and Rhythm: Normal rate.     Pulses: Normal pulses.  Pulmonary:     Effort: Pulmonary effort is normal.  Skin:    General: Skin is warm and dry.  Neurological:     Mental Status: She is alert and oriented to person, place, and time.  Psychiatric:        Mood and Affect: Mood normal.        Behavior: Behavior normal.        Thought Content: Thought content normal.        Judgment: Judgment normal.     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)    Dialysis   Hyponatremia    Microalbuminuria    Peritoneal dialysis catheter in situ (HCC)    Pes planus    Pneumonia    as a child   Renal disorder    CKD stage 3   Sleep apnea    Tricompartment degenerative joint disease of knee    Trochanteric bursitis of both hips    Volvulus (HCC)     Social History   Socioeconomic History   Marital status: Single    Spouse name: Not on file   Number of children: Not on file   Years of education: Not on file   Highest education level: Not on file  Occupational History   Not on file  Tobacco Use   Smoking status: Never    Passive exposure: Never   Smokeless tobacco: Never  Vaping Use   Vaping status: Never Used  Substance and Sexual Activity   Alcohol use: Yes    Comment: rarely   Drug use: No   Sexual activity: Not on file  Other Topics Concern   Not on file  Social History Narrative   Lives at home with son   Social Drivers of Health   Financial Resource Strain: Low Risk  (02/06/2023)   Received from [facility]   Overall Financial Resource Strain (CARDIA)    Difficulty of Paying Living Expenses: Not hard at all  Food Insecurity: No Food Insecurity (02/06/2023)   Received from [facility]   Hunger Vital Sign    Ran Out of Food in the Last Year: Never true    Worried About Running Out of Food in the Last Year: Never true  Transportation Needs: No Transportation Needs (02/06/2023)   Received from [facility]   PRAPARE -  Transportation    Lack of Transportation (Non-Medical): No    In the past 12 months, has lack of transportation kept you from medical appointments or from getting medications?: No  Physical Activity: Inactive (03/10/2021)   Received from [facility], [facility]   Exercise Vital Sign    Days of Exercise per Week: 0 days    Minutes of Exercise per Session: 0 min  Stress: No Stress Concern Present (03/10/2021)   Received from [facility], [facility]   [employer] of Occupational Health - Occupational Stress Questionnaire    Feeling of Stress : Only a little  Social Connections: Not on file  Intimate Partner Violence: Not At Risk (01/24/2023)   Humiliation, Afraid, Rape, and Kick questionnaire    Fear of Current or Ex-Partner: No    Emotionally Abused: No    Physically Abused: No    Sexually Abused: No    Past Surgical History:  Procedure Laterality Date   ABDOMINAL HYSTERECTOMY     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   BREAST BIOPSY Right 1996   benign   BREAST BIOPSY Right 07/13/2022   stereo biopsy/ x clip/ fat necrosis   BREAST BIOPSY Right 07/13/2022   MM RT BREAST BX W LOC DEV 1ST LESION IMAGE BX SPEC STEREO GUIDE 07/13/2022 [facility]   BREAST BIOPSY Right 02/27/2023   Stereo Bx Coil clip- path pending   BREAST BIOPSY Right 02/27/2023   Stereo Bx, ribbon clip- path pending   BREAST BIOPSY Right 02/27/2023   MM RT BREAST BX W LOC DEV 1ST LESION IMAGE BX SPEC STEREO GUIDE 02/27/2023 [facility]   BREAST BIOPSY Right 02/27/2023   MM RT BREAST BX W LOC DEV EA AD LESION IMG BX SPEC STEREO GUIDE 02/27/2023 [facility]   BREAST EXCISIONAL BIOPSY Right 2016   broken ankle surgery Right 02/2021   CAPD INSERTION N/A 10/07/2021   Procedure: LAPAROSCOPIC INSERTION CONTINUOUS AMBULATORY PERITONEAL DIALYSIS  (CAPD) CATHETER;  Surgeon: Leafy Ro, MD;  Location: [facility];  Service: General;   Laterality: N/A;   CAPD REMOVAL N/A 03/31/2023   Procedure: OPEN REMOVAL CONTINUOUS AMBULATORY PERITONEAL DIALYSIS  (CAPD) CATHETER;  Surgeon: Leafy Ro, MD;  Location: [facility];  Service: General;  Laterality: N/A;   COLONOSCOPY     COLONOSCOPY N/A 12/19/2019   Procedure: COLONOSCOPY;  Surgeon: Norma Fredrickson, Boykin Nearing, MD;  Location:  [facility];  Service: Gastroenterology;  Laterality: N/A;   DIALYSIS/PERMA CATHETER INSERTION N/A 01/30/2023   Procedure: DIALYSIS/PERMA CATHETER INSERTION;  Surgeon: Annice Needy, MD;  Location: [facility];  Service: Cardiovascular;  Laterality: N/A;   EXPLORATORY LAPAROTOMY     of colon for torsion   INSERTION OF MESH  10/07/2021   Procedure: INSERTION OF MESH;  Surgeon: Leafy Ro, MD;  Location: [facility];  Service: General;;   NASAL FRACTURE SURGERY     UMBILICAL HERNIA REPAIR N/A 10/07/2021   Procedure: HERNIA REPAIR UMBILICAL ADULT;  Surgeon: Leafy Ro, MD;  Location: [facility];  Service: General;  Laterality: N/A;    Family History  Problem Relation Age of Onset   Diabetes Mother    Diabetes Father    Cancer Father    Breast cancer Maternal Aunt     Allergies  Allergen Reactions   Sulfa Antibiotics Nausea And Vomiting   Wound Dressing Adhesive     Site around PD catheter is irritated from dressing   Misc. Sulfonamide Containing Compounds Nausea And Vomiting       Latest Ref Rng & Units 04/03/2023   10:01 AM 03/31/2023    7:46 AM 02/28/2023   10:16 AM  CBC  WBC 4.0 - 10.5 K/uL 6.0  6.9  9.7   Hemoglobin 12.0 - 15.0 g/dL 8.5  9.0  7.3   Hematocrit 36.0 - 46.0 % 27.0  28.1  22.9   Platelets 150 - 400 K/uL 219  255  244       CMP     Component Value Date/Time   NA 137 03/31/2023 0746   K 3.7 03/31/2023 0746   CL 99 03/31/2023 0746   CO2 27 03/31/2023 0746   GLUCOSE 173 (H) 03/31/2023 0746   BUN 57 (H) 03/31/2023 0746   CREATININE 8.19 (H) 03/31/2023 0746   CALCIUM 9.6 03/31/2023 0746   PROT 5.3 (L) 01/24/2023  0445   ALBUMIN 1.9 (L) 01/24/2023 0445   AST 16 01/24/2023 0445   ALT 25 01/24/2023 0445   ALKPHOS 61 01/24/2023 0445   BILITOT 0.3 01/24/2023 0445   GFRNONAA 5 (L) 03/31/2023 0746     No results found.     Assessment & Plan:   1. 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  The risks, benefits and alternative therapies were reviewed in detail with the patient.  All questions were answered.  The patient agrees to proceed with surgery.   The patient will follow up with me in the office after the surgery.  2. Essential hypertension Continue antihypertensive medications as already ordered, these medications have been reviewed and there are no changes at this time.  3. Type 2 diabetes mellitus with stage 3a chronic kidney disease, with long-term current use of insulin (HCC) Continue hypoglycemic medications as already ordered, these medications have been reviewed and there are no changes at this time.  Hgb A1C to be monitored as already arranged by primary service   Current Outpatient Medications on File Prior to Visit  Medication Sig Dispense Refill   albuterol (VENTOLIN HFA) 108 (90 Base) MCG/ACT inhaler Inhale 2 puffs into the lungs every 4 (four) hours as needed for wheezing or shortness of breath.     amLODipine (NORVASC) 10 MG tablet  Take 10 mg by mouth daily.     calcitRIOL (ROCALTROL) 0.25 MCG capsule Take 0.25 mcg by mouth daily.     carvedilol (COREG) 25 MG tablet Take 25 mg by mouth 2 (two) times daily with a meal.     cloNIDine (CATAPRES) 0.1 MG tablet Take 0.1 mg by mouth 2 (two) times daily.     cyanocobalamin 1000 MCG tablet Take 1 tablet (1,000 mcg total) by mouth daily. 30 tablet 2   fluticasone (FLONASE) 50 MCG/ACT nasal spray Place 2 sprays  into both nostrils daily.     folic acid (FOLVITE) 1 MG tablet Take 1 tablet (1 mg total) by mouth daily. 30 tablet 2   furosemide (LASIX) 80 MG tablet Take 1 tablet (80 mg total) by mouth daily. 30 tablet 2   hydrALAZINE (APRESOLINE) 25 MG tablet Take 25 mg by mouth 2 times daily at 12 noon and 4 pm.     HYDROcodone-acetaminophen (NORCO/VICODIN) 5-325 MG tablet Take 1 tablet by mouth every 6 (six) hours as needed for moderate pain (pain score 4-6). 15 tablet 0   Lancets (ONETOUCH DELICA PLUS LANCET30G) MISC      losartan (COZAAR) 100 MG tablet Take 100 mg by mouth daily.     lovastatin (MEVACOR) 20 MG tablet Take 20 mg by mouth daily.     NOVOLIN 70/30 (70-30) 100 UNIT/ML injection Inject 15 Units into the skin 2 (two) times daily with a meal. Reduced from 20 units twice daily.     PRECISION QID TEST test strip 1 each (1 strip total) 3 (three) times daily Use as instructed.     Vitamin D, Ergocalciferol, (DRISDOL) 1.25 MG (50000 UNIT) CAPS capsule Take 50,000 Units by mouth every 7 (seven) days.     No current facility-administered medications on file prior to visit.    There are no Patient Instructions on file for this visit. No follow-ups on file.   Georgiana Spinner, NP

## 2023-04-19 MED ORDER — CEFAZOLIN SODIUM-DEXTROSE 2-4 GM/100ML-% IV SOLN
2.0000 g | INTRAVENOUS | Status: AC
  Administered 2023-04-20: 2 g via INTRAVENOUS

## 2023-04-19 MED ORDER — CHLORHEXIDINE GLUCONATE CLOTH 2 % EX PADS: 6.0000 | MEDICATED_PAD | Freq: Once | CUTANEOUS | Status: DC

## 2023-04-19 MED ORDER — ORAL CARE MOUTH RINSE: 15.0000 mL | Freq: Once | OROMUCOSAL | Status: AC

## 2023-04-19 MED ORDER — SODIUM CHLORIDE 0.9 % IV SOLN: INTRAVENOUS | Status: DC

## 2023-04-19 MED ORDER — CHLORHEXIDINE GLUCONATE 0.12 % MT SOLN
15.0000 mL | Freq: Once | OROMUCOSAL | Status: AC
Start: 2023-04-19 — End: 2023-04-20
  Administered 2023-04-20: 15 mL via OROMUCOSAL

## 2023-04-20 ENCOUNTER — Ambulatory Visit
Admission: RE | Admit: 2023-04-20 | Discharge: 2023-04-20 | Disposition: A | Discharge status: Home / Self Care | Attending: Vascular Surgery | Admitting: Vascular Surgery

## 2023-04-20 ENCOUNTER — Ambulatory Visit: Admitting: Anesthesiology

## 2023-04-20 ENCOUNTER — Other Ambulatory Visit: Payer: Self-pay

## 2023-04-20 ENCOUNTER — Encounter
Admission: RE | Disposition: A | Discharge status: Home / Self Care | Payer: Self-pay | Source: Home / Self Care | Attending: Vascular Surgery

## 2023-04-20 ENCOUNTER — Encounter: Payer: Self-pay | Admitting: Vascular Surgery

## 2023-04-20 ENCOUNTER — Ambulatory Visit: Payer: Self-pay | Admitting: Urgent Care

## 2023-04-20 DIAGNOSIS — Z992 Dependence on renal dialysis: Secondary | ICD-10-CM

## 2023-04-20 DIAGNOSIS — Z794 Long term (current) use of insulin: Secondary | ICD-10-CM | POA: Insufficient documentation

## 2023-04-20 DIAGNOSIS — N186 End stage renal disease: Secondary | ICD-10-CM | POA: Diagnosis not present

## 2023-04-20 DIAGNOSIS — I120 Hypertensive chronic kidney disease with stage 5 chronic kidney disease or end stage renal disease: Secondary | ICD-10-CM | POA: Insufficient documentation

## 2023-04-20 DIAGNOSIS — E1122 Type 2 diabetes mellitus with diabetic chronic kidney disease: Secondary | ICD-10-CM | POA: Insufficient documentation

## 2023-04-20 DIAGNOSIS — Z01818 Encounter for other preprocedural examination: Secondary | ICD-10-CM

## 2023-04-20 HISTORY — PX: AV FISTULA PLACEMENT: SHX1204

## 2023-04-20 LAB — GLUCOSE, CAPILLARY: Glucose-Capillary: 239 mg/dL — ABNORMAL HIGH (ref 70–99)

## 2023-04-20 LAB — POCT I-STAT, CHEM 8
BUN: 50 mg/dL — ABNORMAL HIGH (ref 8–23)
Calcium, Ion: 1.08 mmol/L — ABNORMAL LOW (ref 1.15–1.40)
Chloride: 96 mmol/L — ABNORMAL LOW (ref 98–111)
Creatinine, Ser: 7 mg/dL — ABNORMAL HIGH (ref 0.44–1.00)
Glucose, Bld: 317 mg/dL — ABNORMAL HIGH (ref 70–99)
HCT: 26 % — ABNORMAL LOW (ref 36.0–46.0)
Hemoglobin: 8.8 g/dL — ABNORMAL LOW (ref 12.0–15.0)
Potassium: 4.3 mmol/L (ref 3.5–5.1)
Sodium: 133 mmol/L — ABNORMAL LOW (ref 135–145)
TCO2: 26 mmol/L (ref 22–32)

## 2023-04-20 SURGERY — ARTERIOVENOUS (AV) FISTULA CREATION
Anesthesia: General | Site: Arm Upper | Laterality: Left

## 2023-04-20 MED ORDER — FENTANYL CITRATE (PF) 100 MCG/2ML IJ SOLN
INTRAMUSCULAR | Status: DC | PRN
  Administered 2023-04-20 (×2): 25 ug via INTRAVENOUS
  Administered 2023-04-20: 50 ug via INTRAVENOUS

## 2023-04-20 MED ORDER — FIBRIN SEALANT 2 ML SINGLE DOSE KIT
PACK | CUTANEOUS | Status: DC | PRN
  Administered 2023-04-20: 2 mL via TOPICAL

## 2023-04-20 MED ORDER — OXYCODONE-ACETAMINOPHEN 5-325 MG PO TABS: 1.0000 | ORAL_TABLET | ORAL | 0 refills | Status: DC | PRN

## 2023-04-20 MED ORDER — FENTANYL CITRATE (PF) 100 MCG/2ML IJ SOLN
INTRAMUSCULAR | Status: AC
Start: 2023-04-20 — End: ?
  Filled 2023-04-20: qty 2

## 2023-04-20 MED ORDER — SODIUM CHLORIDE 0.9 % IV SOLN
INTRAVENOUS | Status: DC | PRN
  Administered 2023-04-20: 60 mL

## 2023-04-20 MED ORDER — ONDANSETRON HCL 4 MG/2ML IJ SOLN: 4.0000 mg | Freq: Four times a day (QID) | INTRAMUSCULAR | Status: DC | PRN

## 2023-04-20 MED ORDER — HEPARIN SODIUM (PORCINE) 1000 UNIT/ML IJ SOLN
INTRAMUSCULAR | Status: DC | PRN
  Administered 2023-04-20: 3000 [IU] via INTRAVENOUS

## 2023-04-20 MED ORDER — ACETAMINOPHEN 10 MG/ML IV SOLN: 1000.0000 mg | Freq: Once | INTRAVENOUS | Status: DC | PRN

## 2023-04-20 MED ORDER — HEPARIN SODIUM (PORCINE) 5000 UNIT/ML IJ SOLN
INTRAMUSCULAR | Status: AC
  Filled 2023-04-20: qty 1

## 2023-04-20 MED ORDER — INSULIN ASPART 100 UNIT/ML IJ SOLN
INTRAMUSCULAR | Status: AC
  Filled 2023-04-20: qty 1

## 2023-04-20 MED ORDER — HYDROMORPHONE HCL 1 MG/ML IJ SOLN
INTRAMUSCULAR | Status: AC
  Filled 2023-04-20: qty 1

## 2023-04-20 MED ORDER — CHLORHEXIDINE GLUCONATE 0.12 % MT SOLN
OROMUCOSAL | Status: AC
  Filled 2023-04-20: qty 15

## 2023-04-20 MED ORDER — PHENYLEPHRINE 80 MCG/ML (10ML) SYRINGE FOR IV PUSH (FOR BLOOD PRESSURE SUPPORT)
PREFILLED_SYRINGE | INTRAVENOUS | Status: DC | PRN
  Administered 2023-04-20: 80 ug via INTRAVENOUS

## 2023-04-20 MED ORDER — CALCIUM CHLORIDE 10 % IV SOLN
INTRAVENOUS | Status: AC
  Filled 2023-04-20: qty 10

## 2023-04-20 MED ORDER — HYDROMORPHONE HCL 1 MG/ML IJ SOLN
1.0000 mg | Freq: Once | INTRAMUSCULAR | Status: AC | PRN
  Administered 2023-04-20: 1 mg via INTRAVENOUS

## 2023-04-20 MED ORDER — OXYCODONE HCL 5 MG PO TABS
ORAL_TABLET | ORAL | Status: AC
  Filled 2023-04-20: qty 1

## 2023-04-20 MED ORDER — HEMOSTATIC AGENTS (NO CHARGE) OPTIME
TOPICAL | Status: DC | PRN
  Administered 2023-04-20: 1 via TOPICAL

## 2023-04-20 MED ORDER — DROPERIDOL 2.5 MG/ML IJ SOLN: 0.6250 mg | Freq: Once | INTRAMUSCULAR | Status: DC | PRN

## 2023-04-20 MED ORDER — CEFAZOLIN SODIUM-DEXTROSE 2-4 GM/100ML-% IV SOLN
INTRAVENOUS | Status: AC
  Filled 2023-04-20: qty 100

## 2023-04-20 MED ORDER — HEPARIN SODIUM (PORCINE) 1000 UNIT/ML IJ SOLN
INTRAMUSCULAR | Status: AC
  Filled 2023-04-20: qty 10

## 2023-04-20 MED ORDER — PHENYLEPHRINE 80 MCG/ML (10ML) SYRINGE FOR IV PUSH (FOR BLOOD PRESSURE SUPPORT)
PREFILLED_SYRINGE | INTRAVENOUS | Status: AC
  Filled 2023-04-20: qty 10

## 2023-04-20 MED ORDER — ACETAMINOPHEN 10 MG/ML IV SOLN
INTRAVENOUS | Status: AC
  Filled 2023-04-20: qty 100

## 2023-04-20 MED ORDER — ONDANSETRON HCL 4 MG/2ML IJ SOLN
INTRAMUSCULAR | Status: AC
  Filled 2023-04-20: qty 2

## 2023-04-20 MED ORDER — FIBRIN SEALANT 2 ML SINGLE DOSE KIT
PACK | CUTANEOUS | Status: AC
  Filled 2023-04-20: qty 2

## 2023-04-20 MED ORDER — ONDANSETRON HCL 4 MG/2ML IJ SOLN
INTRAMUSCULAR | Status: DC | PRN
  Administered 2023-04-20: 4 mg via INTRAVENOUS

## 2023-04-20 MED ORDER — FENTANYL CITRATE (PF) 100 MCG/2ML IJ SOLN
25.0000 ug | INTRAMUSCULAR | Status: DC | PRN
  Administered 2023-04-20 (×2): 50 ug via INTRAVENOUS

## 2023-04-20 MED ORDER — LIDOCAINE HCL (CARDIAC) PF 100 MG/5ML IV SOSY
PREFILLED_SYRINGE | INTRAVENOUS | Status: DC | PRN
  Administered 2023-04-20: 100 mg via INTRAVENOUS

## 2023-04-20 MED ORDER — PROPOFOL 10 MG/ML IV BOLUS
INTRAVENOUS | Status: DC | PRN
  Administered 2023-04-20: 80 mg via INTRAVENOUS
  Administered 2023-04-20: 20 mg via INTRAVENOUS

## 2023-04-20 MED ORDER — OXYCODONE HCL 5 MG/5ML PO SOLN: 5.0000 mg | Freq: Once | ORAL | Status: AC | PRN

## 2023-04-20 MED ORDER — INSULIN ASPART 100 UNIT/ML IJ SOLN
10.0000 [IU] | Freq: Once | INTRAMUSCULAR | Status: AC
  Administered 2023-04-20: 10 [IU] via SUBCUTANEOUS

## 2023-04-20 MED ORDER — PROPOFOL 10 MG/ML IV BOLUS
INTRAVENOUS | Status: AC
  Filled 2023-04-20: qty 40

## 2023-04-20 MED ORDER — ACETAMINOPHEN 10 MG/ML IV SOLN
INTRAVENOUS | Status: DC | PRN
  Administered 2023-04-20: 1000 mg via INTRAVENOUS

## 2023-04-20 MED ORDER — LIDOCAINE HCL (PF) 2 % IJ SOLN
INTRAMUSCULAR | Status: AC
  Filled 2023-04-20: qty 5

## 2023-04-20 MED ORDER — OXYCODONE HCL 5 MG PO TABS
5.0000 mg | ORAL_TABLET | Freq: Once | ORAL | Status: AC | PRN
  Administered 2023-04-20: 5 mg via ORAL

## 2023-04-20 SURGICAL SUPPLY — 43 items
BAG DECANTER FOR FLEXI CONT (MISCELLANEOUS) ×1 IMPLANT
BLADE SURG SZ11 CARB STEEL (BLADE) ×1 IMPLANT
BRUSH SCRUB EZ 4% CHG (MISCELLANEOUS) ×1 IMPLANT
CHLORAPREP W/TINT 26 (MISCELLANEOUS) ×1 IMPLANT
CLAMP SUTURE YELLOW 5 PAIRS (MISCELLANEOUS) ×1 IMPLANT
CLIP SPRNG 6 S-JAW DBL (CLIP) ×1 IMPLANT
CLIP SPRNG 6MM S-JAW DBL (CLIP) ×1 IMPLANT
DERMABOND ADVANCED .7 DNX12 (GAUZE/BANDAGES/DRESSINGS) IMPLANT
DRESSING SURGICEL FIBRLLR 1X2 (HEMOSTASIS) ×1 IMPLANT
DRSG SURGICEL FIBRILLAR 1X2 (HEMOSTASIS) ×1 IMPLANT
ELECT CAUTERY BLADE 6.4 (BLADE) ×1 IMPLANT
ELECT REM PT RETURN 9FT ADLT (ELECTROSURGICAL) ×1 IMPLANT
ELECTRODE REM PT RTRN 9FT ADLT (ELECTROSURGICAL) ×1 IMPLANT
GLOVE BIO SURGEON STRL SZ7 (GLOVE) ×2 IMPLANT
GOWN STRL REUS W/ TWL LRG LVL3 (GOWN DISPOSABLE) ×2 IMPLANT
GOWN STRL REUS W/TWL 2XL LVL3 (GOWN DISPOSABLE) ×1 IMPLANT
IV NS 500ML BAXH (IV SOLUTION) ×1 IMPLANT
KIT TURNOVER KIT A (KITS) ×1 IMPLANT
LABEL OR SOLS (LABEL) ×1 IMPLANT
LOOP VESSEL MAXI 1X406 RED (MISCELLANEOUS) ×1 IMPLANT
LOOP VESSEL MINI 0.8X406 BLUE (MISCELLANEOUS) ×1 IMPLANT
MANIFOLD NEPTUNE II (INSTRUMENTS) ×1 IMPLANT
NDL FILTER BLUNT 18X1 1/2 (NEEDLE) ×1 IMPLANT
NEEDLE FILTER BLUNT 18X1 1/2 (NEEDLE) ×1 IMPLANT
NS IRRIG 500ML POUR BTL (IV SOLUTION) ×1 IMPLANT
PACK EXTREMITY ARMC (MISCELLANEOUS) ×1 IMPLANT
PAD PREP OB/GYN DISP 24X41 (PERSONAL CARE ITEMS) ×1 IMPLANT
SPIKE FLUID TRANSFER (MISCELLANEOUS) ×1 IMPLANT
STOCKINETTE 48X4 2 PLY STRL (GAUZE/BANDAGES/DRESSINGS) ×1 IMPLANT
STOCKINETTE STRL 4IN 9604848 (GAUZE/BANDAGES/DRESSINGS) ×1 IMPLANT
SUT GORETEX 5-0 CV-6/TTC-9 (SUTURE) ×1 IMPLANT
SUT MNCRL AB 4-0 PS2 18 (SUTURE) ×1 IMPLANT
SUT PROLENE 6 0 BV (SUTURE) ×4 IMPLANT
SUT SILK 0 SH 30 (SUTURE) ×1 IMPLANT
SUT SILK 2-0 18XBRD TIE 12 (SUTURE) ×1 IMPLANT
SUT SILK 3-0 18XBRD TIE 12 (SUTURE) ×1 IMPLANT
SUT SILK 4-0 18XBRD TIE 12 (SUTURE) ×1 IMPLANT
SUT VIC AB 3-0 SH 27X BRD (SUTURE) ×1 IMPLANT
SYR 20ML LL LF (SYRINGE) ×1 IMPLANT
SYR 3ML LL SCALE MARK (SYRINGE) ×1 IMPLANT
TAG SUTURE CLAMP YLW 5PR (MISCELLANEOUS) ×1 IMPLANT
TRAP FLUID SMOKE EVACUATOR (MISCELLANEOUS) ×1 IMPLANT
WATER STERILE IRR 500ML POUR (IV SOLUTION) ×1 IMPLANT

## 2023-04-20 NOTE — Op Note (Signed)
 [facility]   OPERATIVE NOTE   PROCEDURE: Left brachiocephalic arteriovenous fistula placement  PRE-OPERATIVE DIAGNOSIS: 1.  ESRD        POST-OPERATIVE DIAGNOSIS: 1. ESRD       SURGEON: [name], MD  ASSISTANT(S): none  ANESTHESIA: general  ESTIMATED BLOOD LOSS: 25 cc  FINDING(S): Adequate cephalic vein for fistula creation  SPECIMEN(S):  none  INDICATIONS:   Heidi [name] is a 68 y.o. female who presents with renal failure in need of pemanent dialysis acces.  The patient is scheduled for left arm AVF placement.  The patient is aware the risks include but are not limited to: bleeding, infection, steal syndrome, nerve damage, ischemic monomelic neuropathy, failure to mature, and need for additional procedures.  The patient is aware of the risks of the procedure and elects to proceed forward.  DESCRIPTION: After full informed written consent was obtained from the patient, the patient was brought back to the operating room and placed supine upon the operating table.  Prior to induction, the patient received IV antibiotics.   After obtaining adequate anesthesia, the patient was then prepped and draped in the standard fashion for a left arm access procedure.  I made a curvilinear incision at the level of the antecubital fossa and dissected through the subcutaneous tissue and fascia to gain exposure of the brachial artery.  This was noted to be patent and adequate in size for fistula creation.  This was dissected out proximally and distally and prepared for control with vessel loops .  I then dissected out the cephalic vein.  This was noted to be patent and reasonable in size for fistula creation.  I then gave the patient 3000 units of intravenous heparin.  The vein was marked for orientation and the distal segment of the vein was ligated with a  2-0 silk, and the vein was transected.  Given her preoperative finding of marginal cephalic vein at the antecubital  fossa, I elected to interrogate the vein with [facility] dilators.  I was able to pass [facility] dilator starting with 2 mm up to 3.5 mm without significant difficulty.  I then instilled the heparinized saline into the vein and clamped it.  At this point, I reset my exposure of the brachial artery and pulled up control on the vessel loops.  I made an arteriotomy with a #11 blade, and then I extended the arteriotomy with a Potts scissor.  I injected heparinized saline proximal and distal to this arteriotomy.  The vein was then sewn to the artery in an end-to-side configuration with a running stitch of 6-0 Prolene.  Prior to completing this anastomosis, I allowed the vein and artery to backbleed.  There was no evidence of clot from any vessels.  I completed the anastomosis in the usual fashion and then released all vessel loops and clamps.  There was a palpable  thrill in the venous outflow, and there was a palpable pulse in the artery distal to the anastomosis.  At this point, I irrigated out the surgical wound.  Surgicel was placed. There was no further active bleeding.  The subcutaneous tissue was reapproximated with a running stitch of 3-0 Vicryl.  The skin was then closed with a 4-0 Monocryl suture.  The skin was then cleaned, dried, and reinforced with Dermabond.  The patient tolerated this procedure well and was taken to the recovery room in stable condition  COMPLICATIONS: None  CONDITION: Stable   [name]    04/20/2023,  9:35 AM  This note was created with Dragon Medical transcription system. Any errors in dictation are purely unintentional.

## 2023-04-20 NOTE — Anesthesia Procedure Notes (Signed)
 Procedure Name: LMA Insertion Date/Time: 04/20/2023 8:05 AM  Performed by: Elisabeth Pigeon, CRNAPre-anesthesia Checklist: Patient identified, Emergency Drugs available, Suction available and Patient being monitored Patient Re-evaluated:Patient Re-evaluated prior to induction Oxygen Delivery Method: Circle system utilized Preoxygenation: Pre-oxygenation with 100% oxygen Induction Type: IV induction Ventilation: Mask ventilation without difficulty LMA: LMA flexible inserted LMA Size: 4.0 Number of attempts: 1 Airway Equipment and Method: Oral airway Placement Confirmation: positive ETCO2 and breath sounds checked- equal and bilateral Tube secured with: Tape Dental Injury: Teeth and Oropharynx as per pre-operative assessment

## 2023-04-20 NOTE — Interval H&P Note (Signed)
 History and Physical Interval Note:  04/20/2023 7:30 AM  Heidi [name]  has presented today for surgery, with the diagnosis of ESRD.  The various methods of treatment have been discussed with the patient and family. After consideration of risks, benefits and other options for treatment, the patient has consented to  Procedure(s): INSERTION, GRAFT, ARTERIOVENOUS, UPPER EXTREMITY (Left) as a surgical intervention.  The patient's history has been reviewed, patient examined, no change in status, stable for surgery.  I have reviewed the patient's chart and labs.  Questions were answered to the patient's satisfaction.     [name]

## 2023-04-20 NOTE — Anesthesia Preprocedure Evaluation (Addendum)
 Anesthesia Evaluation  Patient identified by MRN, date of birth, ID band Patient awake    Reviewed: Allergy & Precautions, NPO status , Patient's Chart, lab work & pertinent test results, reviewed documented beta blocker date and time   History of Anesthesia Complications Negative for: history of anesthetic complications  Airway Mallampati: II  TM Distance: <3 FB Neck ROM: full    Dental  (+) Chipped   Pulmonary asthma , sleep apnea    Pulmonary exam normal        Cardiovascular Exercise Tolerance: Good hypertension, Pt. on home beta blockers and Pt. on medications (-) angina (-) Past MI Normal cardiovascular exam     Neuro/Psych negative neurological ROS  negative psych ROS   GI/Hepatic Neg liver ROS,GERD  Controlled,,  Endo/Other  diabetes, Type 2, Insulin Dependent    Renal/GU DialysisRenal disease     Musculoskeletal  (+) Arthritis ,    Abdominal  (+) + obese  Peds  Hematology  (+) Blood dyscrasia, anemia   Anesthesia Other Findings Past Medical History: No date: Allergic rhinitis No date: Anemia of chronic kidney failure, stage 5 (HCC) No date: Arthritis     Comment:  sacroiliac joint No date: Asthma 09/05/2014: Breast mass in female 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 No date: Diabetes mellitus without complication (HCC) 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, congenital No date: GERD (gastroesophageal reflux disease) No date: Heart murmur No date: Hyperlipidemia No date: Hypertension No date: 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)     Comment:  Dialysis No date: Hyponatremia No date: Microalbuminuria No date: Peritoneal dialysis catheter in situ (HCC) No date: Pes planus No date: Pneumonia     Comment:  as a child No date: Renal disorder     Comment:  CKD stage 3 No date: Sleep apnea No date: Tricompartment degenerative joint  disease of knee No date: Trochanteric bursitis of both hips No date: Volvulus (HCC)  Past Surgical History: No date: ABDOMINAL HYSTERECTOMY 07/24/2014: BREAST BIOPSY; Left     Comment:  PASH 1996: BREAST BIOPSY; Right     Comment:  benign 07/13/2022: BREAST BIOPSY; Right     Comment:  stereo biopsy/ x clip/ fat necrosis 07/13/2022: BREAST BIOPSY; Right     Comment:  MM RT BREAST BX W LOC DEV 1ST LESION IMAGE BX SPEC               STEREO GUIDE 07/13/2022 [facility] 02/27/2023: BREAST BIOPSY; Right     Comment:  Stereo Bx Coil clip- path pending 02/27/2023: BREAST BIOPSY; Right     Comment:  Stereo Bx, ribbon clip- path pending 02/27/2023: BREAST BIOPSY; Right     Comment:  MM RT BREAST BX W LOC DEV 1ST LESION IMAGE BX SPEC               STEREO GUIDE 02/27/2023 [facility] 02/27/2023: BREAST BIOPSY; Right     Comment:  MM RT BREAST BX W LOC DEV EA AD LESION IMG BX SPEC               STEREO GUIDE 02/27/2023 [facility] 2016: BREAST EXCISIONAL BIOPSY; Right 02/2021: broken ankle surgery; Right 10/07/2021: CAPD INSERTION; N/A     Comment:  Procedure: LAPAROSCOPIC INSERTION CONTINUOUS AMBULATORY               PERITONEAL DIALYSIS  (CAPD) CATHETER;  Surgeon: Everlene Farrier,  Merri Ray, MD;  Location: [facility];  Service: General;                Laterality: N/A; No date: COLONOSCOPY 12/19/2019: COLONOSCOPY; N/A     Comment:  Procedure: COLONOSCOPY;  Surgeon: Toledo, Boykin Nearing, MD;              Location: [facility];  Service: Gastroenterology;                Laterality: N/A; 01/30/2023: DIALYSIS/PERMA CATHETER INSERTION; N/A     Comment:  Procedure: DIALYSIS/PERMA CATHETER INSERTION;  Surgeon:               Annice Needy, MD;  Location: [facility];                Service: Cardiovascular;  Laterality: N/A; No date: EXPLORATORY LAPAROTOMY     Comment:  of colon for torsion 10/07/2021: INSERTION OF MESH     Comment:  Procedure: INSERTION OF MESH;  Surgeon:  Leafy Ro,               MD;  Location: [facility];  Service: General;; No date: NASAL FRACTURE SURGERY 10/07/2021: UMBILICAL HERNIA REPAIR; N/A     Comment:  Procedure: HERNIA REPAIR UMBILICAL ADULT;  Surgeon:               Leafy Ro, MD;  Location: [facility];  Service:               General;  Laterality: N/A;  BMI    Body Mass Index: 30.90 kg/m      Reproductive/Obstetrics negative OB ROS                             Anesthesia Physical Anesthesia Plan  ASA: 3  Anesthesia Plan: General   Post-op Pain Management: Ofirmev IV (intra-op)*   Induction: Intravenous  PONV Risk Score and Plan: Ondansetron, Dexamethasone, Treatment may vary due to age or medical condition and Midazolam  Airway Management Planned: LMA  Additional Equipment:   Intra-op Plan:   Post-operative Plan: Extubation in OR  Informed Consent: I have reviewed the patients History and Physical, chart, labs and discussed the procedure including the risks, benefits and alternatives for the proposed anesthesia with the patient or authorized representative who has indicated his/her understanding and acceptance.     Dental Advisory Given  Plan Discussed with: Anesthesiologist, CRNA and Surgeon  Anesthesia Plan Comments:        Anesthesia Quick Evaluation

## 2023-04-20 NOTE — Anesthesia Postprocedure Evaluation (Signed)
 Anesthesia Post Note  Patient: Heidi [name]  Procedure(s) Performed: ARTERIOVENOUS (AV) FISTULA CREATION (Left: Arm Upper)  Patient location during evaluation: PACU Anesthesia Type: General Level of consciousness: awake and alert Pain management: pain level controlled Vital Signs Assessment: post-procedure vital signs reviewed and stable Respiratory status: spontaneous breathing, nonlabored ventilation and respiratory function stable Cardiovascular status: blood pressure returned to baseline and stable Postop Assessment: no apparent nausea or vomiting Anesthetic complications: no   No notable events documented.   Last Vitals:  Vitals:   04/20/23 1115 04/20/23 1127  BP: (!) 148/72 (!) 170/80  Pulse: 78 86  Resp: (!) 8   Temp:  (!) 36.2 C  SpO2: 95% 100%    Last Pain:  Vitals:   04/20/23 1127  TempSrc: Oral  PainSc: 4                  Foye Deer

## 2023-04-20 NOTE — Transfer of Care (Signed)
 Immediate Anesthesia Transfer of Care Note  Patient: Heidi [name]  Procedure(s) Performed: ARTERIOVENOUS (AV) FISTULA CREATION (Left: Arm Upper)  Patient Location: PACU  Anesthesia Type:General  Level of Consciousness: awake  Airway & Oxygen Therapy: Patient Spontanous Breathing and Patient connected to face mask oxygen  Post-op Assessment: Report given to RN and Post -op Vital signs reviewed and stable  Post vital signs: Reviewed and stable  Last Vitals:  Vitals Value Taken Time  BP 147/83 04/20/23 0938  Temp 36.7 C 04/20/23 0938  Pulse 87 04/20/23 0943  Resp 20 04/20/23 0943  SpO2 100 % 04/20/23 0943  Vitals shown include unfiled device data.  Last Pain:  Vitals:   04/20/23 1610  TempSrc: Temporal  PainSc: 0-No pain         Complications: No notable events documented.

## 2023-04-21 ENCOUNTER — Encounter: Payer: Self-pay | Admitting: Vascular Surgery

## 2023-04-21 LAB — TYPE AND SCREEN
ABO/RH(D): O POS
Antibody Screen: POSITIVE
Extend sample reason: TRANSFUSED
Unit division: 0

## 2023-04-21 LAB — BPAM RBC
Blood Product Expiration Date: 202504042359
Unit Type and Rh: 5100

## 2023-05-01 ENCOUNTER — Inpatient Hospital Stay: Payer: Medicare HMO | Attending: Internal Medicine

## 2023-05-01 DIAGNOSIS — Z79899 Other long term (current) drug therapy: Secondary | ICD-10-CM | POA: Insufficient documentation

## 2023-05-01 DIAGNOSIS — I120 Hypertensive chronic kidney disease with stage 5 chronic kidney disease or end stage renal disease: Secondary | ICD-10-CM | POA: Diagnosis present

## 2023-05-01 DIAGNOSIS — N185 Chronic kidney disease, stage 5: Secondary | ICD-10-CM | POA: Insufficient documentation

## 2023-05-01 DIAGNOSIS — Z862 Personal history of diseases of the blood and blood-forming organs and certain disorders involving the immune mechanism: Secondary | ICD-10-CM

## 2023-05-01 DIAGNOSIS — D649 Anemia, unspecified: Secondary | ICD-10-CM

## 2023-05-01 LAB — CBC WITH DIFFERENTIAL (CANCER CENTER ONLY)
Abs Immature Granulocytes: 0.07 10*3/uL (ref 0.00–0.07)
Basophils Absolute: 0.1 10*3/uL (ref 0.0–0.1)
Basophils Relative: 1 %
Eosinophils Absolute: 0.2 10*3/uL (ref 0.0–0.5)
Eosinophils Relative: 1 %
HCT: 24.9 % — ABNORMAL LOW (ref 36.0–46.0)
Hemoglobin: 7.9 g/dL — ABNORMAL LOW (ref 12.0–15.0)
Immature Granulocytes: 1 %
Lymphocytes Relative: 16 %
Lymphs Abs: 1.8 10*3/uL (ref 0.7–4.0)
MCH: 28.5 pg (ref 26.0–34.0)
MCHC: 31.7 g/dL (ref 30.0–36.0)
MCV: 89.9 fL (ref 80.0–100.0)
Monocytes Absolute: 0.8 10*3/uL (ref 0.1–1.0)
Monocytes Relative: 8 %
Neutro Abs: 8.3 10*3/uL — ABNORMAL HIGH (ref 1.7–7.7)
Neutrophils Relative %: 73 %
Platelet Count: 215 10*3/uL (ref 150–400)
RBC: 2.77 MIL/uL — ABNORMAL LOW (ref 3.87–5.11)
RDW: 16.3 % — ABNORMAL HIGH (ref 11.5–15.5)
WBC Count: 11.2 10*3/uL — ABNORMAL HIGH (ref 4.0–10.5)
nRBC: 0 % (ref 0.0–0.2)

## 2023-05-01 LAB — SAMPLE TO BLOOD BANK

## 2023-05-02 ENCOUNTER — Telehealth: Payer: Self-pay

## 2023-05-02 NOTE — Telephone Encounter (Signed)
 Called [phone], voice message left for patient to return call to clinic.  Called caregiver / son [name] at [phone]; voice message also left for son to return call to clinic.

## 2023-05-02 NOTE — Telephone Encounter (Signed)
 Incoming call from patient; informed no need to come tomorrow for blood transfusion.  Reminded of upcoming appointment on 05/29/23. Patient verbalized understanding; has no questions / concerns at this time.

## 2023-05-03 ENCOUNTER — Inpatient Hospital Stay: Payer: Medicare HMO

## 2023-05-16 ENCOUNTER — Telehealth: Payer: Self-pay | Admitting: *Deleted

## 2023-05-16 DIAGNOSIS — N189 Chronic kidney disease, unspecified: Secondary | ICD-10-CM

## 2023-05-16 DIAGNOSIS — D649 Anemia, unspecified: Secondary | ICD-10-CM

## 2023-05-16 NOTE — Telephone Encounter (Signed)
 Dr. Smith Robert says for her to come this week and get labs and 2nd day if she needs blood. Heidi [name] called the pt. And gave her date and time and pt was ok with this.

## 2023-05-17 ENCOUNTER — Inpatient Hospital Stay: Attending: Internal Medicine | Admitting: Oncology

## 2023-05-17 ENCOUNTER — Other Ambulatory Visit: Payer: Self-pay

## 2023-05-17 ENCOUNTER — Other Ambulatory Visit: Payer: Self-pay | Admitting: Oncology

## 2023-05-17 DIAGNOSIS — N185 Chronic kidney disease, stage 5: Secondary | ICD-10-CM | POA: Diagnosis not present

## 2023-05-17 DIAGNOSIS — I10 Essential (primary) hypertension: Secondary | ICD-10-CM | POA: Insufficient documentation

## 2023-05-17 DIAGNOSIS — Z794 Long term (current) use of insulin: Secondary | ICD-10-CM | POA: Diagnosis not present

## 2023-05-17 DIAGNOSIS — Z862 Personal history of diseases of the blood and blood-forming organs and certain disorders involving the immune mechanism: Secondary | ICD-10-CM

## 2023-05-17 DIAGNOSIS — E119 Type 2 diabetes mellitus without complications: Secondary | ICD-10-CM | POA: Diagnosis not present

## 2023-05-17 DIAGNOSIS — D649 Anemia, unspecified: Secondary | ICD-10-CM

## 2023-05-17 DIAGNOSIS — N2581 Secondary hyperparathyroidism of renal origin: Secondary | ICD-10-CM | POA: Insufficient documentation

## 2023-05-17 DIAGNOSIS — I129 Hypertensive chronic kidney disease with stage 1 through stage 4 chronic kidney disease, or unspecified chronic kidney disease: Secondary | ICD-10-CM | POA: Insufficient documentation

## 2023-05-17 DIAGNOSIS — I120 Hypertensive chronic kidney disease with stage 5 chronic kidney disease or end stage renal disease: Secondary | ICD-10-CM | POA: Diagnosis present

## 2023-05-17 DIAGNOSIS — N189 Chronic kidney disease, unspecified: Secondary | ICD-10-CM

## 2023-05-17 DIAGNOSIS — Z79899 Other long term (current) drug therapy: Secondary | ICD-10-CM | POA: Diagnosis not present

## 2023-05-17 DIAGNOSIS — N281 Cyst of kidney, acquired: Secondary | ICD-10-CM | POA: Diagnosis not present

## 2023-05-17 DIAGNOSIS — K219 Gastro-esophageal reflux disease without esophagitis: Secondary | ICD-10-CM | POA: Insufficient documentation

## 2023-05-17 DIAGNOSIS — E785 Hyperlipidemia, unspecified: Secondary | ICD-10-CM | POA: Insufficient documentation

## 2023-05-17 DIAGNOSIS — D631 Anemia in chronic kidney disease: Secondary | ICD-10-CM | POA: Diagnosis not present

## 2023-05-17 LAB — CBC
HCT: 23.9 % — ABNORMAL LOW (ref 36.0–46.0)
Hemoglobin: 7.6 g/dL — ABNORMAL LOW (ref 12.0–15.0)
MCH: 28.4 pg (ref 26.0–34.0)
MCHC: 31.8 g/dL (ref 30.0–36.0)
MCV: 89.2 fL (ref 80.0–100.0)
Platelets: 222 10*3/uL (ref 150–400)
RBC: 2.68 MIL/uL — ABNORMAL LOW (ref 3.87–5.11)
RDW: 16.8 % — ABNORMAL HIGH (ref 11.5–15.5)
WBC: 6.9 10*3/uL (ref 4.0–10.5)
nRBC: 0 % (ref 0.0–0.2)

## 2023-05-17 LAB — FERRITIN: Ferritin: 1057 ng/mL — ABNORMAL HIGH (ref 11–307)

## 2023-05-17 NOTE — Telephone Encounter (Signed)
 Dr. Smith Robert touched base with kidney doctor phone number [phone] regarding treatment plan. Dr. Smith Robert to put in orders shortly.

## 2023-05-18 ENCOUNTER — Ambulatory Visit

## 2023-05-18 ENCOUNTER — Telehealth: Payer: Self-pay

## 2023-05-18 LAB — PREPARE RBC (CROSSMATCH)

## 2023-05-18 NOTE — Progress Notes (Signed)
 Received message back providing correct contact number for kidney doctor [phone].  Dr. Smith Robert already reached out to the kidney doctor; no further follow up needed at this time.

## 2023-05-18 NOTE — Telephone Encounter (Signed)
error 

## 2023-05-19 ENCOUNTER — Inpatient Hospital Stay

## 2023-05-19 DIAGNOSIS — I129 Hypertensive chronic kidney disease with stage 1 through stage 4 chronic kidney disease, or unspecified chronic kidney disease: Secondary | ICD-10-CM | POA: Diagnosis not present

## 2023-05-19 DIAGNOSIS — D649 Anemia, unspecified: Secondary | ICD-10-CM

## 2023-05-19 MED ORDER — ACETAMINOPHEN 325 MG PO TABS
650.0000 mg | ORAL_TABLET | Freq: Once | ORAL | Status: AC
  Administered 2023-05-19: 650 mg via ORAL
  Filled 2023-05-19: qty 2

## 2023-05-19 MED ORDER — SODIUM CHLORIDE 0.9% IV SOLUTION
250.0000 mL | INTRAVENOUS | Status: DC
  Administered 2023-05-19: 100 mL via INTRAVENOUS
  Filled 2023-05-19: qty 250

## 2023-05-20 LAB — TYPE AND SCREEN
ABO/RH(D): O POS
Antibody Screen: POSITIVE
Unit division: 0

## 2023-05-20 LAB — BPAM RBC
Blood Product Expiration Date: 202505152359
ISSUE DATE / TIME: 202504111031
Unit Type and Rh: 5100

## 2023-05-26 ENCOUNTER — Other Ambulatory Visit: Payer: Self-pay

## 2023-05-26 ENCOUNTER — Other Ambulatory Visit
Admission: RE | Admit: 2023-05-26 | Discharge: 2023-05-26 | Disposition: A | Discharge status: Home / Self Care | Source: Ambulatory Visit | Attending: Nephrology | Admitting: Nephrology

## 2023-05-26 DIAGNOSIS — D649 Anemia, unspecified: Secondary | ICD-10-CM

## 2023-05-26 DIAGNOSIS — N186 End stage renal disease: Secondary | ICD-10-CM | POA: Diagnosis present

## 2023-05-26 LAB — HEMOGLOBIN: Hemoglobin: 8.6 g/dL — ABNORMAL LOW (ref 12.0–15.0)

## 2023-05-29 ENCOUNTER — Inpatient Hospital Stay (HOSPITAL_BASED_OUTPATIENT_CLINIC_OR_DEPARTMENT_OTHER): Payer: Medicare HMO | Admitting: Oncology

## 2023-05-29 ENCOUNTER — Encounter: Payer: Self-pay | Admitting: Oncology

## 2023-05-29 ENCOUNTER — Inpatient Hospital Stay: Payer: Medicare HMO

## 2023-05-29 VITALS — BP 153/71 | HR 82 | Temp 96.1°F | Resp 19 | Ht 64.0 in | Wt 181.9 lb

## 2023-05-29 DIAGNOSIS — Z862 Personal history of diseases of the blood and blood-forming organs and certain disorders involving the immune mechanism: Secondary | ICD-10-CM

## 2023-05-29 DIAGNOSIS — D649 Anemia, unspecified: Secondary | ICD-10-CM

## 2023-05-29 DIAGNOSIS — N281 Cyst of kidney, acquired: Secondary | ICD-10-CM | POA: Diagnosis not present

## 2023-05-29 DIAGNOSIS — N185 Chronic kidney disease, stage 5: Secondary | ICD-10-CM | POA: Diagnosis not present

## 2023-05-29 DIAGNOSIS — I129 Hypertensive chronic kidney disease with stage 1 through stage 4 chronic kidney disease, or unspecified chronic kidney disease: Secondary | ICD-10-CM | POA: Diagnosis not present

## 2023-05-29 DIAGNOSIS — N189 Chronic kidney disease, unspecified: Secondary | ICD-10-CM

## 2023-05-29 LAB — IRON AND TIBC
Iron: 57 ug/dL (ref 28–170)
Saturation Ratios: 25 % (ref 10.4–31.8)
TIBC: 228 ug/dL — ABNORMAL LOW (ref 250–450)
UIBC: 171 ug/dL

## 2023-05-29 LAB — CBC WITH DIFFERENTIAL (CANCER CENTER ONLY)
Abs Immature Granulocytes: 0.02 10*3/uL (ref 0.00–0.07)
Basophils Absolute: 0 10*3/uL (ref 0.0–0.1)
Basophils Relative: 1 %
Eosinophils Absolute: 0.1 10*3/uL (ref 0.0–0.5)
Eosinophils Relative: 1 %
HCT: 25.3 % — ABNORMAL LOW (ref 36.0–46.0)
Hemoglobin: 8.2 g/dL — ABNORMAL LOW (ref 12.0–15.0)
Immature Granulocytes: 0 %
Lymphocytes Relative: 18 %
Lymphs Abs: 1.2 10*3/uL (ref 0.7–4.0)
MCH: 28.7 pg (ref 26.0–34.0)
MCHC: 32.4 g/dL (ref 30.0–36.0)
MCV: 88.5 fL (ref 80.0–100.0)
Monocytes Absolute: 0.6 10*3/uL (ref 0.1–1.0)
Monocytes Relative: 9 %
Neutro Abs: 4.8 10*3/uL (ref 1.7–7.7)
Neutrophils Relative %: 71 %
Platelet Count: 192 10*3/uL (ref 150–400)
RBC: 2.86 MIL/uL — ABNORMAL LOW (ref 3.87–5.11)
RDW: 16.3 % — ABNORMAL HIGH (ref 11.5–15.5)
WBC Count: 6.7 10*3/uL (ref 4.0–10.5)
nRBC: 0 % (ref 0.0–0.2)

## 2023-05-29 LAB — FERRITIN: Ferritin: 1132 ng/mL — ABNORMAL HIGH (ref 11–307)

## 2023-05-29 NOTE — Progress Notes (Signed)
 Hematology/Oncology Consult note [facility]  [phone] [phone]  Patient Care Team: Alexander Anes, MD as PCP - General (Family Medicine) George, Sionne A, MD (Family Medicine) Avonne Boettcher, MD as Consulting Physician (Oncology)   Name of the patient: Heidi [name]  [id number]  [birth year]   Date of visit: 05/29/23  Diagnosis-anemia of chronic kidney disease  Chief complaint/ Reason for visit-routine follow-up of anemia of chronic kidney disease  Heme/Onc history: Patient is a 68 year old female with a past medical history significant for hypertension hyperlipidemia GERD osteoarthritis and history of end-stage renal disease on hemodialysis presenting to the ER on 01/24/2023 with symptoms of worsening shortness of breath and lower extremity edema.  Her baseline hemoglobin since January 2023 has been fluctuating between 7-8.  Last outpatient hemoglobin on 10/13/2022 was 8.4 on admission to the hospital on 01/23/2023 her hemoglobin was 7.1 and she has received multiple units of PRBC transfusion and her hemoglobin has remained around 7.  Today it is 8.5.  Ferritin levels on 01/24/2023 were elevated at 1379 with normal iron studies and an iron saturation of 28% B12 level normal at 338 Free light chain both kappa and lambda were elevated with a normal free light chain ratio.  SPEP showed no M protein. Folate normal at 10.1.    Patient was previously evaluated by transplant team at [facility] but more recently patient states that she has declined transplant she was also following up with urology [facility] and was last seen in June 2024.  She had a CT abdomen with and without contrast in May 2024 which showed polycystic kidneys with bilateral renal cysts some of which demonstrate internal enhancement concerning for renal cell carcinoma and a 1.9 cm adrenal nodule.  Continued surveillance was recommended at that time  Interval history-she feels at her baseline  state of health.  She has baseline fatigue and denies any new complaints today.  ECOG PS- 2 Pain scale- 0   Review of systems- Review of Systems  Constitutional:  Positive for malaise/fatigue. Negative for chills, fever and weight loss.  HENT:  Negative for congestion, ear discharge and nosebleeds.   Eyes:  Negative for blurred vision.  Respiratory:  Negative for cough, hemoptysis, sputum production, shortness of breath and wheezing.   Cardiovascular:  Negative for chest pain, palpitations, orthopnea and claudication.  Gastrointestinal:  Negative for abdominal pain, blood in stool, constipation, diarrhea, heartburn, melena, nausea and vomiting.  Genitourinary:  Negative for dysuria, flank pain, frequency, hematuria and urgency.  Musculoskeletal:  Negative for back pain, joint pain and myalgias.  Skin:  Negative for rash.  Neurological:  Negative for dizziness, tingling, focal weakness, seizures, weakness and headaches.  Endo/Heme/Allergies:  Does not bruise/bleed easily.  Psychiatric/Behavioral:  Negative for depression and suicidal ideas. The patient does not have insomnia.       Allergies  Allergen Reactions   Sulfa Antibiotics Nausea And Vomiting   Wound Dressing Adhesive     Site around PD catheter is irritated from dressing   Misc. Sulfonamide Containing Compounds Nausea And Vomiting     Past Medical History:  Diagnosis Date   Allergic rhinitis    Anemia of chronic kidney failure, stage 5 (HCC)    Arthritis    sacroiliac joint   Asthma    Breast mass in female 09/05/2014   Degenerative arthritis of lumbar spine    Diabetes mellitus without complication [facility])    Dialysis patient (HCC)    ESRD (end stage renal  disease) (HCC)    Genu valgus, congenital    GERD (gastroesophageal reflux disease)    Heart murmur    Hyperlipidemia    Hypertension    Hyponatremia    Microalbuminuria    Morbid obesity (HCC)    Pericardial effusion 01/25/2023   Peritoneal dialysis  catheter in situ (HCC)    Pes planus    Pneumonia    as a child   Red blood cell antibody positive    Renal disorder    CKD stage 3   Secondary hyperparathyroidism of renal origin (HCC)    Sepsis due to gram-negative UTI (HCC)    Sleep apnea    does not use cpap   Tricompartment degenerative joint disease of knee    Trochanteric bursitis of both hips    Vitamin D  insufficiency    Volvulus (HCC)      Past Surgical History:  Procedure Laterality Date   ABDOMINAL HYSTERECTOMY     AV FISTULA PLACEMENT Left 04/20/2023   Procedure: ARTERIOVENOUS (AV) FISTULA CREATION;  Surgeon: Celso College, MD;  Location: [facility] ORS;  Service: Vascular;  Laterality: Left;   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   BREAST BIOPSY Right 1996   benign   BREAST BIOPSY Right 07/13/2022   stereo biopsy/ x clip/ fat necrosis   BREAST BIOPSY Right 07/13/2022   MM RT BREAST BX W LOC DEV 1ST LESION IMAGE BX SPEC STEREO GUIDE 07/13/2022 [facility]   BREAST BIOPSY Right 02/27/2023   Stereo Bx Coil clip- path pending   BREAST BIOPSY Right 02/27/2023   Stereo Bx, ribbon clip- path pending   BREAST BIOPSY Right 02/27/2023   MM RT BREAST BX W LOC DEV 1ST LESION IMAGE BX SPEC STEREO GUIDE 02/27/2023 [facility]   BREAST BIOPSY Right 02/27/2023   MM RT BREAST BX W LOC DEV EA AD LESION IMG BX SPEC STEREO GUIDE 02/27/2023 [facility]   BREAST EXCISIONAL BIOPSY Right 2016   broken ankle surgery Right 02/2021   CAPD INSERTION N/A 10/07/2021   Procedure: LAPAROSCOPIC INSERTION CONTINUOUS AMBULATORY PERITONEAL DIALYSIS  (CAPD) CATHETER;  Surgeon: Alben Alma, MD;  Location: [facility] OR[facility];  Service: General;  Laterality: N/A;   CAPD REMOVAL N/A 03/31/2023   Procedure: OPEN REMOVAL CONTINUOUS AMBULATORY PERITONEAL DIALYSIS  (CAPD) CATHETER;  Surgeon: Alben Alma, MD;  Location: [facility];  Service: General;  Laterality: N/A;   COLONOSCOPY     COLONOSCOPY N/A 12/19/2019   Procedure: COLONOSCOPY;  Surgeon: Toledo,  Alphonsus Jeans, MD;  Location: [facility];  Service: Gastroenterology;  Laterality: N/A;   DIALYSIS/PERMA CATHETER INSERTION N/A 01/30/2023   Procedure: DIALYSIS/PERMA CATHETER INSERTION;  Surgeon: Celso College, MD;  Location: [facility];  Service: Cardiovascular;  Laterality: N/A;   EXPLORATORY LAPAROTOMY     of colon for torsion   INSERTION OF MESH  10/07/2021   Procedure: INSERTION OF MESH;  Surgeon: Alben Alma, MD;  Location: [facility];  Service: General;;   NASAL FRACTURE SURGERY     UMBILICAL HERNIA REPAIR N/A 10/07/2021   Procedure: HERNIA REPAIR UMBILICAL ADULT;  Surgeon: Alben Alma, MD;  Location: [facility];  Service: General;  Laterality: N/A;    Social History   Socioeconomic History   Marital status: Single    Spouse name: Not on file   Number of children: Not on file   Years of education: Not on file   Highest education level: Not on file  Occupational History   Not on file  Tobacco Use   Smoking status: Never    Passive exposure: Never   Smokeless tobacco: Never  Vaping Use   Vaping status: Never Used  Substance and Sexual Activity   Alcohol use: Yes    Comment: rarely   Drug use: No   Sexual activity: Not on file  Other Topics Concern   Not on file  Social History Narrative   Lives at home with son   Social Drivers of Health   Financial Resource Strain: Low Risk  (02/06/2023)   Received [facility]   Overall Financial Resource Strain (CARDIA)    Difficulty of Paying Living Expenses: Not hard at all  Food Insecurity: No Food Insecurity (02/06/2023)   [facility]   Hunger Vital Sign    Ran Out of Food in the Last Year: Never true    Worried About Running Out of Food in the Last Year: Never true  Transportation Needs: No Transportation Needs (02/06/2023)   Received from [facility]   PRAPARE - Transportation    Lack of Transportation (Non-Medical): No    In the past  12 months, has lack of transportation kept you from medical appointments or from getting medications?: No  Physical Activity: Inactive (03/10/2021)   Received from [facility], [facility]   Exercise Vital Sign    Days of Exercise per Week: 0 days    Minutes of Exercise per Session: 0 min  Stress: No Stress Concern Present (03/10/2021)   Received from [facility], [facility]   [employer] of Occupational Health - Occupational Stress Questionnaire    Feeling of Stress : Only a little  Social Connections: Not on file  Intimate Partner Violence: Not At Risk (01/24/2023)   Humiliation, Afraid, Rape, and Kick questionnaire    Fear of Current or Ex-Partner: No    Emotionally Abused: No    Physically Abused: No    Sexually Abused: No    Family History  Problem Relation Age of Onset   Diabetes Mother    Diabetes Father    Cancer Father    Breast cancer Maternal Aunt      Current Outpatient Medications:    albuterol  (VENTOLIN  HFA) 108 (90 Base) MCG/ACT inhaler, Inhale 2 puffs into the lungs every 4 (four) hours as needed for wheezing or shortness of breath., Disp: , Rfl:    amLODipine  (NORVASC ) 10 MG tablet, Take 10 mg by mouth every morning., Disp: , Rfl:    calcitRIOL  (ROCALTROL ) 0.25 MCG capsule, Take 0.25 mcg by mouth daily., Disp: , Rfl:    carvedilol  (COREG ) 25 MG tablet, Take 25 mg by mouth 2 (two) times daily with a meal., Disp: , Rfl:    cloNIDine  (CATAPRES ) 0.1 MG tablet, Take 0.1 mg by mouth 2 (two) times daily., Disp: , Rfl:    cyanocobalamin  1000 MCG tablet, Take 1 tablet (1,000 mcg total) by mouth daily., Disp: 30 tablet, Rfl: 2   fluticasone  (FLONASE ) 50 MCG/ACT nasal spray, Place 2 sprays into both nostrils at bedtime., Disp: , Rfl:    folic acid  (FOLVITE ) 1 MG tablet, Take 1 tablet (1 mg total) by mouth daily., Disp: 30 tablet, Rfl: 2   furosemide  (LASIX ) 80 MG tablet, Take 1 tablet (80 mg total)  by mouth daily. (Patient taking differently: Take 80 mg by mouth every morning.), Disp: 30 tablet, Rfl: 2   hydrALAZINE  (APRESOLINE ) 25 MG tablet, Take 25  mg by mouth 2 times daily at 12 noon and 4 pm. (Patient taking differently: Take 25 mg by mouth 2 times daily at 12 noon and 4 pm. Takes AM AND PM), Disp: , Rfl:    Lancets (ONETOUCH DELICA PLUS LANCET30G) MISC, , Disp: , Rfl:    losartan  (COZAAR ) 100 MG tablet, Take 100 mg by mouth every morning., Disp: , Rfl:    lovastatin (MEVACOR) 20 MG tablet, Take 20 mg by mouth at bedtime., Disp: , Rfl:    NOVOLIN  70/30 (70-30) 100 UNIT/ML injection, Inject 15 Units into the skin 2 (two) times daily with a meal. Reduced from 20 units twice daily., Disp: , Rfl:    oxyCODONE -acetaminophen  (PERCOCET) 5-325 MG tablet, Take 1 tablet by mouth every 4 (four) hours as needed for severe pain (pain score 7-10). (Patient not taking: Reported on 05/29/2023), Disp: 20 tablet, Rfl: 0   PRECISION QID TEST test strip, 1 each (1 strip total) 3 (three) times daily Use as instructed., Disp: , Rfl:    Vitamin D , Ergocalciferol , (DRISDOL ) 1.25 MG (50000 UNIT) CAPS capsule, Take 50,000 Units by mouth every 7 (seven) days. Saturdays, Disp: , Rfl:   Physical exam:  Vitals:   05/29/23 0957  BP: (!) 153/71  Pulse: 82  Resp: 19  Temp: (!) 96.1 F (35.6 C)  TempSrc: Tympanic  SpO2: 100%  Weight: 181 lb 14.4 oz (82.5 kg)  Height: 5\' 4"  (1.626 m)   Physical Exam Cardiovascular:     Rate and Rhythm: Normal rate and regular rhythm.     Heart sounds: Normal heart sounds.  Pulmonary:     Effort: Pulmonary effort is normal.     Breath sounds: Normal breath sounds.  Skin:    General: Skin is warm and dry.  Neurological:     Mental Status: She is alert and oriented to person, place, and time.      I have personally reviewed labs listed below:    Latest Ref Rng & Units 04/20/2023    6:48 AM  CMP  Glucose 70 - 99 mg/dL 161   BUN 8 - 23 mg/dL 50   Creatinine 0.96 - 1.00  mg/dL 0.45   Sodium 409 - 811 mmol/L 133   Potassium 3.5 - 5.1 mmol/L 4.3   Chloride 98 - 111 mmol/L 96       Latest Ref Rng & Units 05/29/2023    9:28 AM  CBC  WBC 4.0 - 10.5 K/uL 6.7   Hemoglobin 12.0 - 15.0 g/dL 8.2   Hematocrit 91.4 - 46.0 % 25.3   Platelets 150 - 400 K/uL 192       Assessment and plan- Patient is a 68 y.o. female here for routine follow-up of anemia of chronic kidney disease  Patient's hemoglobin fluctuates between 7-8 requiring intermittent blood transfusions.  She does not need a blood transfusion today.  She is currently on hemodialysis for stage V kidney disease.  She tells me that she is not currently pursuing transplant.  I had previously discussed her case with Dr. Erminio Hazy from nephrology to see if she would be a candidate for ESA and plan was to hold off given the concern for potential renal cell carcinoma which was noted on her CT abdomen last done in May 2024 when she was found to have bilateral renal cysts as well.  I plan to repeat a CT at this time.  I will reach out to [facility] urology to follow-up on CT findings if there is any  need for intervention for the renal mass.  I am concerned that without the use of ESA patient will become transfusion dependent and ESA's would likely add quality of life for this patient.  For now I am checking her labs every month and I will see her back in 3 months   Visit Diagnosis 1. Chronic kidney disease, stage 5, kidney failure (HCC)   2. Symptomatic anemia   3. History of anemia due to chronic kidney disease   4. Bilateral renal cysts      Dr. Seretha Dance, MD, MPH [facility] at [facility] [phone] 05/29/2023 11:18 AM

## 2023-05-30 ENCOUNTER — Other Ambulatory Visit (INDEPENDENT_AMBULATORY_CARE_PROVIDER_SITE_OTHER): Payer: Self-pay | Admitting: Vascular Surgery

## 2023-05-30 DIAGNOSIS — Z9889 Other specified postprocedural states: Secondary | ICD-10-CM

## 2023-05-30 DIAGNOSIS — N186 End stage renal disease: Secondary | ICD-10-CM

## 2023-05-31 ENCOUNTER — Ambulatory Visit (INDEPENDENT_AMBULATORY_CARE_PROVIDER_SITE_OTHER): Admitting: Nurse Practitioner

## 2023-05-31 ENCOUNTER — Encounter (INDEPENDENT_AMBULATORY_CARE_PROVIDER_SITE_OTHER): Payer: Self-pay | Admitting: Nurse Practitioner

## 2023-05-31 ENCOUNTER — Inpatient Hospital Stay: Payer: Medicare HMO

## 2023-05-31 ENCOUNTER — Ambulatory Visit (INDEPENDENT_AMBULATORY_CARE_PROVIDER_SITE_OTHER)

## 2023-05-31 VITALS — BP 152/77 | HR 94 | Resp 16 | Wt 179.6 lb

## 2023-05-31 DIAGNOSIS — Z9889 Other specified postprocedural states: Secondary | ICD-10-CM | POA: Diagnosis not present

## 2023-05-31 DIAGNOSIS — N186 End stage renal disease: Secondary | ICD-10-CM

## 2023-05-31 DIAGNOSIS — E1122 Type 2 diabetes mellitus with diabetic chronic kidney disease: Secondary | ICD-10-CM

## 2023-05-31 DIAGNOSIS — N1831 Chronic kidney disease, stage 3a: Secondary | ICD-10-CM

## 2023-05-31 DIAGNOSIS — I10 Essential (primary) hypertension: Secondary | ICD-10-CM

## 2023-05-31 DIAGNOSIS — Z794 Long term (current) use of insulin: Secondary | ICD-10-CM

## 2023-06-05 ENCOUNTER — Encounter (INDEPENDENT_AMBULATORY_CARE_PROVIDER_SITE_OTHER): Payer: Self-pay | Admitting: Nurse Practitioner

## 2023-06-05 NOTE — Progress Notes (Signed)
 Subjective:    Patient ID: Heidi [name], female    DOB: [birth year], 68 y.o.   MRN: [id number] Chief Complaint  Patient presents with   Routine Post Op    [facility] 5 week post op with HDA    The patient returns today for first evaluation of her left brachiocephalic AV fistula.  Today the patient has a flow volume of 1205.  It was clear that on 04/20/2023.  She has a very good thrill and bruit.  She still has some mild swelling around the area.    Review of Systems  All other systems reviewed and are negative.      Objective:   Physical Exam Vitals reviewed.  HENT:     Head: Normocephalic.  Cardiovascular:     Rate and Rhythm: Normal rate.     Pulses: Normal pulses.  Pulmonary:     Effort: Pulmonary effort is normal.  Skin:    General: Skin is warm and dry.  Neurological:     Mental Status: She is alert and oriented to person, place, and time.  Psychiatric:        Mood and Affect: Mood normal.        Behavior: Behavior normal.        Thought Content: Thought content normal.        Judgment: Judgment normal.     BP (!) 152/77   Pulse 94   Resp 16   Wt 179 lb 9.6 oz (81.5 kg)   BMI 30.83 kg/m   Past Medical History:  Diagnosis Date   Allergic rhinitis    Anemia of chronic kidney failure, stage 5 (HCC)    Arthritis    sacroiliac joint   Asthma    Breast mass in female 09/05/2014   Degenerative arthritis of lumbar spine    Diabetes mellitus without complication (HCC)    Dialysis patient (HCC)    ESRD (end stage renal disease) (HCC)    Genu valgus, congenital    GERD (gastroesophageal reflux disease)    Heart murmur    Hyperlipidemia    Hypertension    Hyponatremia    Microalbuminuria    Morbid obesity (HCC)    Pericardial effusion 01/25/2023   Peritoneal dialysis catheter in situ (HCC)    Pes planus    Pneumonia    as a child   Red blood cell antibody positive    Renal disorder    CKD stage 3   Secondary hyperparathyroidism of renal origin (HCC)     Sepsis due to gram-negative UTI (HCC)    Sleep apnea    does not use cpap   Tricompartment degenerative joint disease of knee    Trochanteric bursitis of both hips    Vitamin D  insufficiency    Volvulus (HCC)     Social History   Socioeconomic History   Marital status: Single    Spouse name: Not on file   Number of children: Not on file   Years of education: Not on file   Highest education level: Not on file  Occupational History   Not on file  Tobacco Use   Smoking status: Never    Passive exposure: Never   Smokeless tobacco: Never  Vaping Use   Vaping status: Never Used  Substance and Sexual Activity   Alcohol use: Yes    Comment: rarely   Drug use: No   Sexual activity: Not on file  Other Topics Concern   Not on file  Social  History Narrative   Lives at home with son   Social Drivers of Health   Financial Resource Strain: Low Risk  (02/06/2023)   Received from [facility]   Overall Financial Resource Strain (CARDIA)    Difficulty of Paying Living Expenses: Not hard at all  Food Insecurity: No Food Insecurity (02/06/2023)   Received from [facility]   Hunger Vital Sign    Ran Out of Food in the Last Year: Never true    Worried About Running Out of Food in the Last Year: Never true  Transportation Needs: No Transportation Needs (02/06/2023)   Received from [facility]   PRAPARE - Transportation    Lack of Transportation (Non-Medical): No    In the past 12 months, has lack of transportation kept you from medical appointments or from getting medications?: No  Physical Activity: Inactive (03/10/2021)   Received from [facility], [facility]   Exercise Vital Sign    Days of Exercise per Week: 0 days    Minutes of Exercise per Session: 0 min  Stress: No Stress Concern Present (03/10/2021)   Received from [facility], [facility]   [employer] of Occupational Health - Occupational Stress Questionnaire    Feeling of Stress : Only a little  Social Connections: Not on file  Intimate Partner Violence: Not At Risk (01/24/2023)   Humiliation, Afraid, Rape, and Kick questionnaire    Fear of Current or Ex-Partner: No    Emotionally Abused: No    Physically Abused: No    Sexually Abused: No    Past Surgical History:  Procedure Laterality Date   ABDOMINAL HYSTERECTOMY     AV FISTULA PLACEMENT Left 04/20/2023   Procedure: ARTERIOVENOUS (AV) FISTULA CREATION;  Surgeon: Celso College, MD;  Location: [facility];  Service: Vascular;  Laterality: Left;   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   BREAST BIOPSY Right 1996   benign   BREAST BIOPSY Right 07/13/2022   stereo biopsy/ x clip/ fat necrosis   BREAST BIOPSY Right 07/13/2022   MM RT BREAST BX W LOC DEV 1ST LESION IMAGE BX SPEC STEREO GUIDE 07/13/2022 [facility]   BREAST BIOPSY Right 02/27/2023   Stereo Bx Coil clip- path pending   BREAST BIOPSY Right 02/27/2023   Stereo Bx, ribbon clip- path pending   BREAST BIOPSY Right 02/27/2023   MM RT BREAST BX W LOC DEV 1ST LESION IMAGE BX SPEC STEREO GUIDE 02/27/2023 [facility]   BREAST BIOPSY Right 02/27/2023   MM RT BREAST BX W LOC DEV EA AD LESION IMG BX SPEC STEREO GUIDE 02/27/2023 [facility]   BREAST EXCISIONAL BIOPSY Right 2016   broken ankle surgery Right 02/2021   CAPD INSERTION N/A 10/07/2021   Procedure: LAPAROSCOPIC INSERTION CONTINUOUS AMBULATORY PERITONEAL DIALYSIS  (CAPD) CATHETER;  Surgeon: Alben Alma, MD;  Location: [facility];  Service: General;  Laterality: N/A;   CAPD REMOVAL N/A 03/31/2023   Procedure: OPEN REMOVAL CONTINUOUS AMBULATORY PERITONEAL DIALYSIS  (CAPD) CATHETER;  Surgeon: Alben Alma, MD;  Location: [facility];  Service: General;  Laterality: N/A;   COLONOSCOPY     COLONOSCOPY N/A 12/19/2019   Procedure: COLONOSCOPY;  Surgeon: Toledo, Alphonsus Jeans, MD;  Location: [facility];  Service:  Gastroenterology;  Laterality: N/A;   DIALYSIS/PERMA CATHETER INSERTION N/A 01/30/2023   Procedure: DIALYSIS/PERMA CATHETER INSERTION;  Surgeon: Celso College, MD;  Location: [facility];  Service:  Cardiovascular;  Laterality: N/A;   EXPLORATORY LAPAROTOMY     of colon for torsion   INSERTION OF MESH  10/07/2021   Procedure: INSERTION OF MESH;  Surgeon: Alben Alma, MD;  Location: [facility];  Service: General;;   NASAL FRACTURE SURGERY     UMBILICAL HERNIA REPAIR N/A 10/07/2021   Procedure: HERNIA REPAIR UMBILICAL ADULT;  Surgeon: Alben Alma, MD;  Location: [facility];  Service: General;  Laterality: N/A;    Family History  Problem Relation Age of Onset   Diabetes Mother    Diabetes Father    Cancer Father    Breast cancer Maternal Aunt     Allergies  Allergen Reactions   Sulfa Antibiotics Nausea And Vomiting   Wound Dressing Adhesive     Site around PD catheter is irritated from dressing   Misc. Sulfonamide Containing Compounds Nausea And Vomiting       Latest Ref Rng & Units 05/29/2023    9:28 AM 05/26/2023    8:45 AM 05/17/2023   10:35 AM  CBC  WBC 4.0 - 10.5 K/uL 6.7   6.9   Hemoglobin 12.0 - 15.0 g/dL 8.2  8.6  7.6   Hematocrit 36.0 - 46.0 % 25.3   23.9   Platelets 150 - 400 K/uL 192   222       CMP     Component Value Date/Time   NA 133 (L) 04/20/2023 0648   K 4.3 04/20/2023 0648   CL 96 (L) 04/20/2023 0648   CO2 27 03/31/2023 0746   GLUCOSE 317 (H) 04/20/2023 0648   BUN 50 (H) 04/20/2023 0648   CREATININE 7.00 (H) 04/20/2023 0648   CALCIUM  9.6 03/31/2023 0746   PROT 5.3 (L) 01/24/2023 0445   ALBUMIN  1.9 (L) 01/24/2023 0445   AST 16 01/24/2023 0445   ALT 25 01/24/2023 0445   ALKPHOS 61 01/24/2023 0445   BILITOT 0.3 01/24/2023 0445   GFRNONAA 5 (L) 03/31/2023 0746     No results found.     Assessment & Plan:   1. ESRD (end stage renal disease) (HCC) (Primary) Recommend:  The patient is doing well and currently has adequate dialysis  access.  We will allow her fistula to be used in 3 weeks.  Will send a letter to her dialysis center to indicate that fact.  The patient will follow-up with me in the office in 3 months.  The need for a follow up duplex ultrasound will be made at that time based on whether problems with the access are persistent.    2. Essential hypertension Continue antihypertensive medications as already ordered, these medications have been reviewed and there are no changes at this time.  3. Type 2 diabetes mellitus with stage 3a chronic kidney disease, with long-term current use of insulin  (HCC) Continue hypoglycemic medications as already ordered, these medications have been reviewed and there are no changes at this time.  Hgb A1C to be monitored as already arranged by primary service   Current Outpatient Medications on File Prior to Visit  Medication Sig Dispense Refill   albuterol  (VENTOLIN  HFA) 108 (90 Base) MCG/ACT inhaler Inhale 2 puffs into the lungs every 4 (four) hours as needed for wheezing or shortness of breath.     amLODipine  (NORVASC ) 10 MG tablet Take 10 mg by mouth every morning.     calcitRIOL  (ROCALTROL ) 0.25 MCG capsule Take 0.25 mcg by mouth daily.     carvedilol  (COREG ) 25 MG tablet Take 25 mg by  mouth 2 (two) times daily with a meal.     cloNIDine  (CATAPRES ) 0.1 MG tablet Take 0.1 mg by mouth 2 (two) times daily.     cyanocobalamin  1000 MCG tablet Take 1 tablet (1,000 mcg total) by mouth daily. 30 tablet 2   fluticasone  (FLONASE ) 50 MCG/ACT nasal spray Place 2 sprays into both nostrils at bedtime.     folic acid  (FOLVITE ) 1 MG tablet Take 1 tablet (1 mg total) by mouth daily. 30 tablet 2   furosemide  (LASIX ) 80 MG tablet Take 1 tablet (80 mg total) by mouth daily. (Patient taking differently: Take 80 mg by mouth every morning.) 30 tablet 2   hydrALAZINE  (APRESOLINE ) 25 MG tablet Take 25 mg by mouth 2 times daily at 12 noon and 4 pm. (Patient taking differently: Take 25 mg by mouth 2  times daily at 12 noon and 4 pm. Takes AM AND PM)     Lancets (ONETOUCH DELICA PLUS LANCET30G) MISC      losartan  (COZAAR ) 100 MG tablet Take 100 mg by mouth every morning.     lovastatin (MEVACOR) 20 MG tablet Take 20 mg by mouth at bedtime.     NOVOLIN  70/30 (70-30) 100 UNIT/ML injection Inject 15 Units into the skin 2 (two) times daily with a meal. Reduced from 20 units twice daily.     PRECISION QID TEST test strip 1 each (1 strip total) 3 (three) times daily Use as instructed.     Vitamin D , Ergocalciferol , (DRISDOL ) 1.25 MG (50000 UNIT) CAPS capsule Take 50,000 Units by mouth every 7 (seven) days. Saturdays     oxyCODONE -acetaminophen  (PERCOCET) 5-325 MG tablet Take 1 tablet by mouth every 4 (four) hours as needed for severe pain (pain score 7-10). (Patient not taking: Reported on 05/31/2023) 20 tablet 0   No current facility-administered medications on file prior to visit.    There are no Patient Instructions on file for this visit. No follow-ups on file.   Nadean Montanaro E Laderrick Wilk, NP

## 2023-06-12 ENCOUNTER — Other Ambulatory Visit: Payer: Self-pay | Admitting: Family Medicine

## 2023-06-12 DIAGNOSIS — Z1231 Encounter for screening mammogram for malignant neoplasm of breast: Secondary | ICD-10-CM

## 2023-06-20 ENCOUNTER — Telehealth: Payer: Self-pay | Admitting: *Deleted

## 2023-06-20 ENCOUNTER — Other Ambulatory Visit: Payer: Self-pay

## 2023-06-20 DIAGNOSIS — D649 Anemia, unspecified: Secondary | ICD-10-CM

## 2023-06-20 DIAGNOSIS — Z862 Personal history of diseases of the blood and blood-forming organs and certain disorders involving the immune mechanism: Secondary | ICD-10-CM

## 2023-06-20 NOTE — Telephone Encounter (Signed)
 RN returned Heidi [name] call from triage line.  Heidi [name] reports she had dialysis today and her hemoglobin was 7.5 there.  Heidi [name] requesting earlier appointment.  Next appt scheduled for lab 06/28/23 and possible infusion 06/30/23.  Nurse instructed Heidi [name] she would send message to Dr Randy Buttery and her team and someone would call her with updated appts and plan. Pt verbalized understanding.

## 2023-06-20 NOTE — Telephone Encounter (Signed)
 Cbc and old tube for possible transfusion asap preferably this week

## 2023-06-21 ENCOUNTER — Inpatient Hospital Stay

## 2023-06-21 ENCOUNTER — Inpatient Hospital Stay: Attending: Internal Medicine

## 2023-06-21 ENCOUNTER — Other Ambulatory Visit: Payer: Self-pay | Admitting: Oncology

## 2023-06-21 ENCOUNTER — Other Ambulatory Visit: Payer: Self-pay

## 2023-06-21 ENCOUNTER — Ambulatory Visit: Payer: Self-pay | Admitting: Oncology

## 2023-06-21 DIAGNOSIS — D649 Anemia, unspecified: Secondary | ICD-10-CM

## 2023-06-21 DIAGNOSIS — N189 Chronic kidney disease, unspecified: Secondary | ICD-10-CM

## 2023-06-21 DIAGNOSIS — I120 Hypertensive chronic kidney disease with stage 5 chronic kidney disease or end stage renal disease: Secondary | ICD-10-CM | POA: Diagnosis present

## 2023-06-21 DIAGNOSIS — N185 Chronic kidney disease, stage 5: Secondary | ICD-10-CM | POA: Diagnosis not present

## 2023-06-21 LAB — CBC WITH DIFFERENTIAL (CANCER CENTER ONLY)
Abs Immature Granulocytes: 0.05 10*3/uL (ref 0.00–0.07)
Basophils Absolute: 0 10*3/uL (ref 0.0–0.1)
Basophils Relative: 0 %
Eosinophils Absolute: 0.1 10*3/uL (ref 0.0–0.5)
Eosinophils Relative: 1 %
HCT: 23.4 % — ABNORMAL LOW (ref 36.0–46.0)
Hemoglobin: 7.5 g/dL — ABNORMAL LOW (ref 12.0–15.0)
Immature Granulocytes: 1 %
Lymphocytes Relative: 17 %
Lymphs Abs: 1.4 10*3/uL (ref 0.7–4.0)
MCH: 28.4 pg (ref 26.0–34.0)
MCHC: 32.1 g/dL (ref 30.0–36.0)
MCV: 88.6 fL (ref 80.0–100.0)
Monocytes Absolute: 0.7 10*3/uL (ref 0.1–1.0)
Monocytes Relative: 8 %
Neutro Abs: 6.3 10*3/uL (ref 1.7–7.7)
Neutrophils Relative %: 73 %
Platelet Count: 204 10*3/uL (ref 150–400)
RBC: 2.64 MIL/uL — ABNORMAL LOW (ref 3.87–5.11)
RDW: 16 % — ABNORMAL HIGH (ref 11.5–15.5)
WBC Count: 8.5 10*3/uL (ref 4.0–10.5)
nRBC: 0 % (ref 0.0–0.2)

## 2023-06-21 LAB — SAMPLE TO BLOOD BANK

## 2023-06-21 NOTE — Telephone Encounter (Signed)
 Patient states she did not get message regarding appts.  Did not come this am for labs but says she can come around 3:00 today for lab draw.  Can you change the lab appt time to around 3:00 today?

## 2023-06-22 ENCOUNTER — Inpatient Hospital Stay

## 2023-06-22 DIAGNOSIS — I120 Hypertensive chronic kidney disease with stage 5 chronic kidney disease or end stage renal disease: Secondary | ICD-10-CM | POA: Diagnosis not present

## 2023-06-22 DIAGNOSIS — D649 Anemia, unspecified: Secondary | ICD-10-CM

## 2023-06-22 LAB — PREPARE RBC (CROSSMATCH)

## 2023-06-22 MED ORDER — SODIUM CHLORIDE 0.9% IV SOLUTION
250.0000 mL | INTRAVENOUS | Status: DC
Start: 2023-06-22 — End: 2023-06-22
  Administered 2023-06-22: 100 mL via INTRAVENOUS
  Filled 2023-06-22: qty 250

## 2023-06-23 LAB — TYPE AND SCREEN
ABO/RH(D): O POS
Antibody Screen: POSITIVE
Unit division: 0

## 2023-06-23 LAB — BPAM RBC
Blood Product Expiration Date: 202506022359
ISSUE DATE / TIME: 202505151303
Unit Type and Rh: 5100

## 2023-06-27 ENCOUNTER — Encounter (INDEPENDENT_AMBULATORY_CARE_PROVIDER_SITE_OTHER): Payer: Self-pay

## 2023-06-28 ENCOUNTER — Inpatient Hospital Stay

## 2023-06-28 ENCOUNTER — Ambulatory Visit: Payer: Self-pay | Admitting: Oncology

## 2023-06-28 DIAGNOSIS — I120 Hypertensive chronic kidney disease with stage 5 chronic kidney disease or end stage renal disease: Secondary | ICD-10-CM | POA: Diagnosis not present

## 2023-06-28 DIAGNOSIS — D649 Anemia, unspecified: Secondary | ICD-10-CM

## 2023-06-28 DIAGNOSIS — N189 Chronic kidney disease, unspecified: Secondary | ICD-10-CM

## 2023-06-28 LAB — CBC WITH DIFFERENTIAL (CANCER CENTER ONLY)
Abs Immature Granulocytes: 0.04 10*3/uL (ref 0.00–0.07)
Basophils Absolute: 0 10*3/uL (ref 0.0–0.1)
Basophils Relative: 0 %
Eosinophils Absolute: 0.1 10*3/uL (ref 0.0–0.5)
Eosinophils Relative: 1 %
HCT: 25.5 % — ABNORMAL LOW (ref 36.0–46.0)
Hemoglobin: 8.5 g/dL — ABNORMAL LOW (ref 12.0–15.0)
Immature Granulocytes: 1 %
Lymphocytes Relative: 19 %
Lymphs Abs: 1.3 10*3/uL (ref 0.7–4.0)
MCH: 29 pg (ref 26.0–34.0)
MCHC: 33.3 g/dL (ref 30.0–36.0)
MCV: 87 fL (ref 80.0–100.0)
Monocytes Absolute: 0.6 10*3/uL (ref 0.1–1.0)
Monocytes Relative: 9 %
Neutro Abs: 4.7 10*3/uL (ref 1.7–7.7)
Neutrophils Relative %: 70 %
Platelet Count: 209 10*3/uL (ref 150–400)
RBC: 2.93 MIL/uL — ABNORMAL LOW (ref 3.87–5.11)
RDW: 15.2 % (ref 11.5–15.5)
WBC Count: 6.7 10*3/uL (ref 4.0–10.5)
nRBC: 0 % (ref 0.0–0.2)

## 2023-06-28 LAB — SAMPLE TO BLOOD BANK

## 2023-06-28 NOTE — Telephone Encounter (Signed)
-----   Message from Avonne Boettcher sent at 06/28/2023 10:59 AM EDT ----- No transfusion

## 2023-06-28 NOTE — Telephone Encounter (Signed)
 Per Dr. Randy Buttery "No transfusion"; Hgb 8.5.  Outbound call to patient; informed of above.  Advised patient to contact clinic for any questions / concerns. Routed note to scheduling to update accordingly.

## 2023-06-30 ENCOUNTER — Inpatient Hospital Stay

## 2023-06-30 LAB — IRON AND TIBC
Iron: 56 ug/dL (ref 28–170)
Saturation Ratios: 26 % (ref 10.4–31.8)
TIBC: 218 ug/dL — ABNORMAL LOW (ref 250–450)
UIBC: 162 ug/dL

## 2023-07-04 ENCOUNTER — Ambulatory Visit
Admission: RE | Admit: 2023-07-04 | Discharge: 2023-07-04 | Disposition: A | Discharge status: Home / Self Care | Source: Ambulatory Visit | Attending: Family Medicine | Admitting: Family Medicine

## 2023-07-04 DIAGNOSIS — Z1231 Encounter for screening mammogram for malignant neoplasm of breast: Secondary | ICD-10-CM | POA: Diagnosis present

## 2023-07-17 ENCOUNTER — Telehealth: Payer: Self-pay | Admitting: *Deleted

## 2023-07-17 NOTE — Telephone Encounter (Signed)
 No transfusion for 8.4. keep appt for 6/18 as planned

## 2023-07-17 NOTE — Telephone Encounter (Signed)
 Per Dr. Randy Buttery "No transfusion for 8.4. keep appt for 6/18 as planned".  Outbound call; spoke to patient informed of above.  Has no additional questions / concerns at this time.

## 2023-07-17 NOTE — Telephone Encounter (Signed)
 Patient was told in dialysis that she has a 8 4 hemoglobin and she wanted to know if she needs to be coming over and get it checked.  She has an appointment for June 18 to see if the hemoglobin is low enough to need transfusion.

## 2023-07-26 ENCOUNTER — Other Ambulatory Visit: Payer: Self-pay | Admitting: Oncology

## 2023-07-26 ENCOUNTER — Other Ambulatory Visit: Payer: Self-pay

## 2023-07-26 ENCOUNTER — Inpatient Hospital Stay: Attending: Internal Medicine

## 2023-07-26 ENCOUNTER — Other Ambulatory Visit: Payer: Self-pay | Admitting: Family Medicine

## 2023-07-26 DIAGNOSIS — Z79899 Other long term (current) drug therapy: Secondary | ICD-10-CM | POA: Diagnosis not present

## 2023-07-26 DIAGNOSIS — D631 Anemia in chronic kidney disease: Secondary | ICD-10-CM | POA: Insufficient documentation

## 2023-07-26 DIAGNOSIS — N189 Chronic kidney disease, unspecified: Secondary | ICD-10-CM

## 2023-07-26 DIAGNOSIS — D649 Anemia, unspecified: Secondary | ICD-10-CM

## 2023-07-26 DIAGNOSIS — I120 Hypertensive chronic kidney disease with stage 5 chronic kidney disease or end stage renal disease: Secondary | ICD-10-CM | POA: Diagnosis present

## 2023-07-26 DIAGNOSIS — N1831 Chronic kidney disease, stage 3a: Secondary | ICD-10-CM | POA: Diagnosis not present

## 2023-07-26 DIAGNOSIS — Z780 Asymptomatic menopausal state: Secondary | ICD-10-CM

## 2023-07-26 LAB — CBC WITH DIFFERENTIAL (CANCER CENTER ONLY)
Abs Immature Granulocytes: 0.02 10*3/uL (ref 0.00–0.07)
Basophils Absolute: 0 10*3/uL (ref 0.0–0.1)
Basophils Relative: 1 %
Eosinophils Absolute: 0.1 10*3/uL (ref 0.0–0.5)
Eosinophils Relative: 2 %
HCT: 21.8 % — ABNORMAL LOW (ref 36.0–46.0)
Hemoglobin: 7 g/dL — ABNORMAL LOW (ref 12.0–15.0)
Immature Granulocytes: 0 %
Lymphocytes Relative: 22 %
Lymphs Abs: 1.3 10*3/uL (ref 0.7–4.0)
MCH: 29.4 pg (ref 26.0–34.0)
MCHC: 32.1 g/dL (ref 30.0–36.0)
MCV: 91.6 fL (ref 80.0–100.0)
Monocytes Absolute: 0.6 10*3/uL (ref 0.1–1.0)
Monocytes Relative: 10 %
Neutro Abs: 3.8 10*3/uL (ref 1.7–7.7)
Neutrophils Relative %: 65 %
Platelet Count: 208 10*3/uL (ref 150–400)
RBC: 2.38 MIL/uL — ABNORMAL LOW (ref 3.87–5.11)
RDW: 15.1 % (ref 11.5–15.5)
WBC Count: 5.8 10*3/uL (ref 4.0–10.5)
nRBC: 0 % (ref 0.0–0.2)

## 2023-07-27 LAB — PREPARE RBC (CROSSMATCH)

## 2023-07-28 ENCOUNTER — Inpatient Hospital Stay

## 2023-07-28 DIAGNOSIS — D649 Anemia, unspecified: Secondary | ICD-10-CM

## 2023-07-28 DIAGNOSIS — I120 Hypertensive chronic kidney disease with stage 5 chronic kidney disease or end stage renal disease: Secondary | ICD-10-CM | POA: Diagnosis not present

## 2023-07-28 LAB — TYPE AND SCREEN

## 2023-07-28 LAB — BPAM RBC: PRODUCT CODE: 202507232359

## 2023-07-28 MED ORDER — SODIUM CHLORIDE 0.9% IV SOLUTION
250.0000 mL | INTRAVENOUS | Status: DC
  Administered 2023-07-28: 100 mL via INTRAVENOUS
  Filled 2023-07-28: qty 250

## 2023-07-28 NOTE — Patient Instructions (Signed)

## 2023-07-31 LAB — BPAM RBC
Blood Product Expiration Date: 202507232359
ISSUE DATE / TIME: 202506201027
Unit Type and Rh: 5100

## 2023-07-31 LAB — TYPE AND SCREEN
ABO/RH(D): O POS
Antibody Screen: POSITIVE
Donor AG Type: NEGATIVE
Unit division: 0

## 2023-08-08 ENCOUNTER — Telehealth: Payer: Self-pay

## 2023-08-08 ENCOUNTER — Other Ambulatory Visit (INDEPENDENT_AMBULATORY_CARE_PROVIDER_SITE_OTHER): Payer: Self-pay | Admitting: Nurse Practitioner

## 2023-08-08 DIAGNOSIS — T82590A Other mechanical complication of surgically created arteriovenous fistula, initial encounter: Secondary | ICD-10-CM

## 2023-08-08 DIAGNOSIS — N186 End stage renal disease: Secondary | ICD-10-CM

## 2023-08-08 NOTE — Telephone Encounter (Signed)
 Patient left message stating that she went to dialysis today and they told her that her hemoglobin is 7.7 and for her to call us  to see if she needs to be seen.

## 2023-08-08 NOTE — Telephone Encounter (Signed)
 Patient aware that's its okay to wait until Monday 7/7 appointment per Dr Melanee doesn't need to be seen sooner.

## 2023-08-09 ENCOUNTER — Ambulatory Visit (INDEPENDENT_AMBULATORY_CARE_PROVIDER_SITE_OTHER): Admitting: Nurse Practitioner

## 2023-08-09 ENCOUNTER — Encounter (INDEPENDENT_AMBULATORY_CARE_PROVIDER_SITE_OTHER)

## 2023-08-10 ENCOUNTER — Other Ambulatory Visit: Payer: Self-pay

## 2023-08-10 DIAGNOSIS — N189 Chronic kidney disease, unspecified: Secondary | ICD-10-CM

## 2023-08-14 ENCOUNTER — Encounter: Payer: Self-pay | Admitting: Oncology

## 2023-08-14 ENCOUNTER — Inpatient Hospital Stay: Attending: Internal Medicine

## 2023-08-14 ENCOUNTER — Inpatient Hospital Stay (HOSPITAL_BASED_OUTPATIENT_CLINIC_OR_DEPARTMENT_OTHER): Admitting: Oncology

## 2023-08-14 ENCOUNTER — Other Ambulatory Visit: Payer: Self-pay

## 2023-08-14 VITALS — BP 140/62 | HR 84 | Temp 97.8°F | Resp 18 | Ht 64.0 in | Wt 176.0 lb

## 2023-08-14 DIAGNOSIS — N2581 Secondary hyperparathyroidism of renal origin: Secondary | ICD-10-CM | POA: Diagnosis not present

## 2023-08-14 DIAGNOSIS — N189 Chronic kidney disease, unspecified: Secondary | ICD-10-CM

## 2023-08-14 DIAGNOSIS — Z79899 Other long term (current) drug therapy: Secondary | ICD-10-CM | POA: Insufficient documentation

## 2023-08-14 DIAGNOSIS — Z862 Personal history of diseases of the blood and blood-forming organs and certain disorders involving the immune mechanism: Secondary | ICD-10-CM

## 2023-08-14 DIAGNOSIS — Z803 Family history of malignant neoplasm of breast: Secondary | ICD-10-CM | POA: Diagnosis not present

## 2023-08-14 DIAGNOSIS — G4730 Sleep apnea, unspecified: Secondary | ICD-10-CM | POA: Insufficient documentation

## 2023-08-14 DIAGNOSIS — N185 Chronic kidney disease, stage 5: Secondary | ICD-10-CM | POA: Diagnosis not present

## 2023-08-14 DIAGNOSIS — Z794 Long term (current) use of insulin: Secondary | ICD-10-CM | POA: Insufficient documentation

## 2023-08-14 DIAGNOSIS — D631 Anemia in chronic kidney disease: Secondary | ICD-10-CM | POA: Insufficient documentation

## 2023-08-14 DIAGNOSIS — E785 Hyperlipidemia, unspecified: Secondary | ICD-10-CM | POA: Insufficient documentation

## 2023-08-14 DIAGNOSIS — E1122 Type 2 diabetes mellitus with diabetic chronic kidney disease: Secondary | ICD-10-CM | POA: Diagnosis not present

## 2023-08-14 DIAGNOSIS — I120 Hypertensive chronic kidney disease with stage 5 chronic kidney disease or end stage renal disease: Secondary | ICD-10-CM | POA: Diagnosis present

## 2023-08-14 DIAGNOSIS — N2889 Other specified disorders of kidney and ureter: Secondary | ICD-10-CM | POA: Diagnosis not present

## 2023-08-14 LAB — FERRITIN: Ferritin: 998 ng/mL — ABNORMAL HIGH (ref 11–307)

## 2023-08-14 LAB — CBC (CANCER CENTER ONLY)
HCT: 23 % — ABNORMAL LOW (ref 36.0–46.0)
Hemoglobin: 7.4 g/dL — ABNORMAL LOW (ref 12.0–15.0)
MCH: 29.5 pg (ref 26.0–34.0)
MCHC: 32.2 g/dL (ref 30.0–36.0)
MCV: 91.6 fL (ref 80.0–100.0)
Platelet Count: 209 K/uL (ref 150–400)
RBC: 2.51 MIL/uL — ABNORMAL LOW (ref 3.87–5.11)
RDW: 14.7 % (ref 11.5–15.5)
WBC Count: 6.2 K/uL (ref 4.0–10.5)
nRBC: 0 % (ref 0.0–0.2)

## 2023-08-14 LAB — IRON AND TIBC
Iron: 57 ug/dL (ref 28–170)
Saturation Ratios: 26 % (ref 10.4–31.8)
TIBC: 216 ug/dL — ABNORMAL LOW (ref 250–450)
UIBC: 159 ug/dL

## 2023-08-14 LAB — SAMPLE TO BLOOD BANK

## 2023-08-14 NOTE — Progress Notes (Signed)
Patient is feeling more fatigued. 

## 2023-08-14 NOTE — Progress Notes (Signed)
 Hematology/Oncology Consult note [facility]  [phone] [phone]  Patient Care Team: Zachary Idelia LABOR, MD as PCP - General (Family Medicine) George, Sionne A, MD (Family Medicine) Melanee Annah BROCKS, MD as Consulting Physician (Oncology)   Name of the patient: Heidi [name]  [id number]  22-Jan-1956   Date of visit: 08/14/23  Diagnosis-anemia of chronic kidney disease  Chief complaint/ Reason for visit-routine follow-up of anemia  Heme/Onc history: Patient is a 68 year old female with a past medical history significant for hypertension hyperlipidemia GERD osteoarthritis and history of end-stage renal disease on hemodialysis presenting to the ER on 01/24/2023 with symptoms of worsening shortness of breath and lower extremity edema.  Her baseline hemoglobin since January 2023 has been fluctuating between 7-8.  Last outpatient hemoglobin on 10/13/2022 was 8.4 on admission to the hospital on 01/23/2023 her hemoglobin was 7.1 and she has received multiple units of PRBC transfusion and her hemoglobin has remained around 7.  Today it is 8.5.  Ferritin levels on 01/24/2023 were elevated at 1379 with normal iron studies and an iron saturation of 28% B12 level normal at 338 Free light chain both kappa and lambda were elevated with a normal free light chain ratio.  SPEP showed no M protein. Folate normal at 10.1.    Patient was previously evaluated by transplant team at [facility] but more recently patient states that she has declined transplant she was also following up with urology [facility] and was last seen in June 2024.  She had a CT abdomen with and without contrast in May 2024 which showed polycystic kidneys with bilateral renal cysts some of which demonstrate internal enhancement concerning for renal cell carcinoma and a 1.9 cm adrenal nodule.  Continued surveillance was recommended at that time  Interval history-patient has baseline fatigue which has remained  unchanged and does not feel any different even when she gets blood transfusions.  ECOG PS- 2 Pain scale- 0   Review of systems- Review of Systems  Constitutional:  Positive for malaise/fatigue. Negative for chills, fever and weight loss.  HENT:  Negative for congestion, ear discharge and nosebleeds.   Eyes:  Negative for blurred vision.  Respiratory:  Negative for cough, hemoptysis, sputum production, shortness of breath and wheezing.   Cardiovascular:  Negative for chest pain, palpitations, orthopnea and claudication.  Gastrointestinal:  Negative for abdominal pain, blood in stool, constipation, diarrhea, heartburn, melena, nausea and vomiting.  Genitourinary:  Negative for dysuria, flank pain, frequency, hematuria and urgency.  Musculoskeletal:  Negative for back pain, joint pain and myalgias.  Skin:  Negative for rash.  Neurological:  Negative for dizziness, tingling, focal weakness, seizures, weakness and headaches.  Endo/Heme/Allergies:  Does not bruise/bleed easily.  Psychiatric/Behavioral:  Negative for depression and suicidal ideas. The patient does not have insomnia.       Allergies  Allergen Reactions   Sulfa Antibiotics Nausea And Vomiting   Wound Dressing Adhesive     Site around PD catheter is irritated from dressing   Misc. Sulfonamide Containing Compounds Nausea And Vomiting     Past Medical History:  Diagnosis Date   Allergic rhinitis    Anemia of chronic kidney failure, stage 5 (HCC)    Arthritis    sacroiliac joint   Asthma    Breast mass in female 09/05/2014   Degenerative arthritis of lumbar spine    Diabetes mellitus without complication [facility])    Dialysis patient (HCC)    ESRD (end stage renal disease) (HCC)  Genu valgus, congenital    GERD (gastroesophageal reflux disease)    Heart murmur    Hyperlipidemia    Hypertension    Hyponatremia    Microalbuminuria    Morbid obesity (HCC)    Pericardial effusion 01/25/2023   Peritoneal dialysis  catheter in situ (HCC)    Pes planus    Pneumonia    as a child   Red blood cell antibody positive    Renal disorder    CKD stage 3   Secondary hyperparathyroidism of renal origin (HCC)    Sepsis due to gram-negative UTI (HCC)    Sleep apnea    does not use cpap   Tricompartment degenerative joint disease of knee    Trochanteric bursitis of both hips    Vitamin D  insufficiency    Volvulus (HCC)      Past Surgical History:  Procedure Laterality Date   ABDOMINAL HYSTERECTOMY     AV FISTULA PLACEMENT Left 04/20/2023   Procedure: ARTERIOVENOUS (AV) FISTULA CREATION;  Surgeon: Marea Selinda RAMAN, MD;  Location: [facility];  Service: Vascular;  Laterality: Left;   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   BREAST BIOPSY Right 1996   benign   BREAST BIOPSY Right 07/13/2022   MM RT BREAST BX W LOC DEV 1ST LESION IMAGE BX SPEC STEREO GUIDE 07/13/2022 [facility]   BREAST BIOPSY Right 02/27/2023   MM RT BREAST BX W LOC DEV 1ST LESION IMAGE BX SPEC STEREO GUIDE 02/27/2023 [facility]   BREAST BIOPSY Right 02/27/2023   MM RT BREAST BX W LOC DEV EA AD LESION IMG BX SPEC STEREO GUIDE 02/27/2023 [facility]   BREAST EXCISIONAL BIOPSY Right 2016   broken ankle surgery Right 02/2021   CAPD INSERTION N/A 10/07/2021   Procedure: LAPAROSCOPIC INSERTION CONTINUOUS AMBULATORY PERITONEAL DIALYSIS  (CAPD) CATHETER;  Surgeon: Jordis Laneta FALCON, MD;  Location: [facility];  Service: General;  Laterality: N/A;   CAPD REMOVAL N/A 03/31/2023   Procedure: OPEN REMOVAL CONTINUOUS AMBULATORY PERITONEAL DIALYSIS  (CAPD) CATHETER;  Surgeon: Jordis Laneta FALCON, MD;  Location: [facility];  Service: General;  Laterality: N/A;   COLONOSCOPY     COLONOSCOPY N/A 12/19/2019   Procedure: COLONOSCOPY;  Surgeon: Toledo, Ladell POUR, MD;  Location: [facility];  Service: Gastroenterology;  Laterality: N/A;   DIALYSIS/PERMA CATHETER INSERTION N/A 01/30/2023   Procedure: DIALYSIS/PERMA CATHETER INSERTION;  Surgeon: Marea Selinda RAMAN,  MD;  Location: [facility];  Service: Cardiovascular;  Laterality: N/A;   EXPLORATORY LAPAROTOMY     of colon for torsion   INSERTION OF MESH  10/07/2021   Procedure: INSERTION OF MESH;  Surgeon: Jordis Laneta FALCON, MD;  Location: [facility];  Service: General;;   NASAL FRACTURE SURGERY     UMBILICAL HERNIA REPAIR N/A 10/07/2021   Procedure: HERNIA REPAIR UMBILICAL ADULT;  Surgeon: Jordis Laneta FALCON, MD;  Location: [facility];  Service: General;  Laterality: N/A;    Social History   Socioeconomic History   Marital status: Single    Spouse name: Not on file   Number of children: Not on file   Years of education: Not on file   Highest education level: Not on file  Occupational History   Not on file  Tobacco Use   Smoking status: Never    Passive exposure: Never   Smokeless tobacco: Never  Vaping Use   Vaping status: Never Used  Substance and Sexual Activity   Alcohol use: Yes    Comment: rarely   Drug use:  No   Sexual activity: Not on file  Other Topics Concern   Not on file  Social History Narrative   Lives at home with son   Social Drivers of Health   Financial Resource Strain: Low Risk  (02/06/2023)   Received from [facility]   Overall Financial Resource Strain (CARDIA)    Difficulty of Paying Living Expenses: Not hard at all  Food Insecurity: No Food Insecurity (02/06/2023)   Received from [facility]   Hunger Vital Sign    Within the past 12 months, the food you bought just didn't last and you didn't have money to get more.: Never true    Within the past 12 months, you worried that your food would run out before you got the money to buy more.: Never true  Transportation Needs: No Transportation Needs (02/06/2023)   Received from [facility]   PRAPARE - Transportation    Lack of Transportation (Non-Medical): No    In the past 12 months, has lack of transportation kept you from medical appointments or from  getting medications?: No  Physical Activity: Inactive (03/10/2021)   Received from [facility]   Exercise Vital Sign    On average, how many days per week do you engage in moderate to strenuous exercise (like a brisk walk)?: 0 days    On average, how many minutes do you engage in exercise at this level?: 0 min  Stress: No Stress Concern Present (03/10/2021)   Received from [facility] - Occupational Stress Questionnaire    Feeling of Stress : Only a little  Social Connections: Not on file  Intimate Partner Violence: Not At Risk (01/24/2023)   Humiliation, Afraid, Rape, and Kick questionnaire    Fear of Current or Ex-Partner: No    Emotionally Abused: No    Physically Abused: No    Sexually Abused: No    Family History  Problem Relation Age of Onset   Diabetes Mother    Diabetes Father    Cancer Father    Breast cancer Maternal Aunt      Current Outpatient Medications:    albuterol  (VENTOLIN  HFA) 108 (90 Base) MCG/ACT inhaler, Inhale 2 puffs into the lungs every 4 (four) hours as needed for wheezing or shortness of breath., Disp: , Rfl:    amLODipine  (NORVASC ) 10 MG tablet, Take 10 mg by mouth every morning., Disp: , Rfl:    calcitRIOL  (ROCALTROL ) 0.25 MCG capsule, Take 0.25 mcg by mouth daily., Disp: , Rfl:    carvedilol  (COREG ) 25 MG tablet, Take 25 mg by mouth 2 (two) times daily with a meal., Disp: , Rfl:    cloNIDine  (CATAPRES ) 0.1 MG tablet, Take 0.1 mg by mouth 2 (two) times daily., Disp: , Rfl:    cyanocobalamin  1000 MCG tablet, Take 1 tablet (1,000 mcg total) by mouth daily., Disp: 30 tablet, Rfl: 2   fluticasone  (FLONASE ) 50 MCG/ACT nasal spray, Place 2 sprays into both nostrils at bedtime., Disp: , Rfl:    folic acid  (FOLVITE ) 1 MG tablet, Take 1 tablet (1 mg total) by mouth daily., Disp: 30 tablet, Rfl: 2   furosemide  (LASIX ) 80 MG tablet, Take 1 tablet (80 mg total) by mouth daily., Disp: 30  tablet, Rfl: 2   hydrALAZINE  (APRESOLINE ) 25 MG tablet, Take 25 mg by mouth 2 times daily at 12 noon and 4 pm., Disp: , Rfl:    Lancets (  ONETOUCH DELICA PLUS LANCET30G) MISC, , Disp: , Rfl:    losartan  (COZAAR ) 100 MG tablet, Take 100 mg by mouth every morning., Disp: , Rfl:    lovastatin (MEVACOR) 20 MG tablet, Take 20 mg by mouth at bedtime., Disp: , Rfl:    NOVOLIN  70/30 (70-30) 100 UNIT/ML injection, Inject 15 Units into the skin 2 (two) times daily with a meal. Reduced from 20 units twice daily., Disp: , Rfl:    Vitamin D , Ergocalciferol , (DRISDOL ) 1.25 MG (50000 UNIT) CAPS capsule, Take 50,000 Units by mouth every 7 (seven) days. Saturdays, Disp: , Rfl:    oxyCODONE -acetaminophen  (PERCOCET) 5-325 MG tablet, Take 1 tablet by mouth every 4 (four) hours as needed for severe pain (pain score 7-10). (Patient not taking: Reported on 08/14/2023), Disp: 20 tablet, Rfl: 0  Physical exam:  Vitals:   08/14/23 1121  BP: (!) 140/62  Pulse: 84  Resp: 18  Temp: 97.8 F (36.6 C)  TempSrc: Tympanic  SpO2: 100%  Weight: 176 lb (79.8 kg)  Height: 5' 4 (1.626 m)   Physical Exam Cardiovascular:     Rate and Rhythm: Normal rate and regular rhythm.     Heart sounds: Normal heart sounds.  Pulmonary:     Effort: Pulmonary effort is normal.     Breath sounds: Normal breath sounds.  Abdominal:     General: Bowel sounds are normal.     Palpations: Abdomen is soft.  Skin:    General: Skin is warm and dry.  Neurological:     Mental Status: She is alert and oriented to person, place, and time.      I have personally reviewed labs listed below:    Latest Ref Rng & Units 04/20/2023    6:48 AM  CMP  Glucose 70 - 99 mg/dL 682   BUN 8 - 23 mg/dL 50   Creatinine 9.55 - 1.00 mg/dL 2.99   Sodium 864 - 854 mmol/L 133   Potassium 3.5 - 5.1 mmol/L 4.3   Chloride 98 - 111 mmol/L 96       Latest Ref Rng & Units 08/14/2023   10:57 AM  CBC  WBC 4.0 - 10.5 K/uL 6.2   Hemoglobin 12.0 - 15.0 g/dL 7.4    Hematocrit 63.9 - 46.0 % 23.0   Platelets 150 - 400 K/uL 209     Assessment and plan- Patient is a 68 y.o. female here for follow-up of following issues  Anemia of chronic kidney disease: Patient is on hemodialysis and is not receiving EPO through nephrology as there was concern about a possible renal mass in the setting of polycystic kidney disease.  She has been requiring periodic transfusions.  Today her hemoglobin is 7.4 and I am proceeding with 1 unit of PRBC this week.  However given the fact that patient has not experienced much change in her symptoms with or without blood transfusion I am inclined to only offer blood transfusions in the future if her hemoglobin drops down to less than 7.  Iron studies are indicative of anemia of chronic disease.  I am also concerned about repeated blood transfusions that can lead to iron overload in her case.  She will continue to get labs checked every 2 weeks for possible blood transfusion.  I will see her back in 2 months with labs  History of renal Mass: Patient had undergone renal cell carcinoma protocol back in March 2024 which showed bilateral renal cysts which appear to demonstrate nodular internal foci of enhancement  which can be seen with renal cell carcinoma.  This was done at [facility] and patient does not wish to follow-up with urology there at this time.  I am repeating a CT abdomen pelvis with and without contrast to take a look at these findings again.  If overall these masses remain stable I will discuss with nephrology if we can offer her EPO since her blood transfusions are becoming frequent for her anemia   Visit Diagnosis 1. History of anemia due to chronic kidney disease   2. Renal mass      Dr. Annah Skene, MD, MPH [facility] at [facility] [phone] 08/14/2023 6:52 PM

## 2023-08-16 ENCOUNTER — Inpatient Hospital Stay

## 2023-08-16 DIAGNOSIS — N189 Chronic kidney disease, unspecified: Secondary | ICD-10-CM

## 2023-08-16 DIAGNOSIS — I120 Hypertensive chronic kidney disease with stage 5 chronic kidney disease or end stage renal disease: Secondary | ICD-10-CM | POA: Diagnosis not present

## 2023-08-16 LAB — PREPARE RBC (CROSSMATCH)

## 2023-08-16 MED ORDER — SODIUM CHLORIDE 0.9% IV SOLUTION
250.0000 mL | INTRAVENOUS | Status: DC
  Filled 2023-08-16: qty 250

## 2023-08-16 NOTE — Patient Instructions (Signed)

## 2023-08-17 LAB — TYPE AND SCREEN
ABO/RH(D): O POS
Antibody Screen: POSITIVE
Unit division: 0

## 2023-08-17 LAB — BPAM RBC
Blood Product Expiration Date: 202508142359
ISSUE DATE / TIME: 202507091023
Unit Type and Rh: 9500

## 2023-08-21 ENCOUNTER — Ambulatory Visit
Admission: RE | Admit: 2023-08-21 | Discharge: 2023-08-21 | Disposition: A | Discharge status: Home / Self Care | Source: Ambulatory Visit | Attending: Oncology | Admitting: Oncology

## 2023-08-21 DIAGNOSIS — N189 Chronic kidney disease, unspecified: Secondary | ICD-10-CM | POA: Insufficient documentation

## 2023-08-21 DIAGNOSIS — Z862 Personal history of diseases of the blood and blood-forming organs and certain disorders involving the immune mechanism: Secondary | ICD-10-CM | POA: Insufficient documentation

## 2023-08-21 HISTORY — DX: Disorder of kidney and ureter, unspecified: N28.9

## 2023-08-21 MED ORDER — IOHEXOL 300 MG/ML  SOLN
100.0000 mL | Freq: Once | INTRAMUSCULAR | Status: AC | PRN
  Administered 2023-08-21: 100 mL via INTRAVENOUS
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
## 2023-08-28 ENCOUNTER — Inpatient Hospital Stay

## 2023-08-29 ENCOUNTER — Telehealth: Payer: Self-pay

## 2023-08-29 ENCOUNTER — Telehealth: Payer: Self-pay | Admitting: *Deleted

## 2023-08-29 NOTE — Telephone Encounter (Signed)
 Created in error, please disregard.

## 2023-08-29 NOTE — Telephone Encounter (Signed)
 Patient is scheduled for blood transfusion tomorrow, but no-showed for her labs last week.  Called to discuss need for labs with patient.  No answer.  Left message for her to return my call.

## 2023-08-30 ENCOUNTER — Ambulatory Visit (INDEPENDENT_AMBULATORY_CARE_PROVIDER_SITE_OTHER): Admitting: Nurse Practitioner

## 2023-08-30 ENCOUNTER — Inpatient Hospital Stay

## 2023-08-30 ENCOUNTER — Encounter (INDEPENDENT_AMBULATORY_CARE_PROVIDER_SITE_OTHER)

## 2023-09-11 ENCOUNTER — Inpatient Hospital Stay: Attending: Internal Medicine

## 2023-09-11 DIAGNOSIS — D631 Anemia in chronic kidney disease: Secondary | ICD-10-CM | POA: Diagnosis not present

## 2023-09-11 DIAGNOSIS — N189 Chronic kidney disease, unspecified: Secondary | ICD-10-CM | POA: Insufficient documentation

## 2023-09-11 DIAGNOSIS — I120 Hypertensive chronic kidney disease with stage 5 chronic kidney disease or end stage renal disease: Secondary | ICD-10-CM | POA: Insufficient documentation

## 2023-09-11 DIAGNOSIS — Z79899 Other long term (current) drug therapy: Secondary | ICD-10-CM | POA: Diagnosis not present

## 2023-09-11 DIAGNOSIS — D649 Anemia, unspecified: Secondary | ICD-10-CM

## 2023-09-11 LAB — CBC
HCT: 23.7 % — ABNORMAL LOW (ref 36.0–46.0)
Hemoglobin: 7.6 g/dL — ABNORMAL LOW (ref 12.0–15.0)
MCH: 30.2 pg (ref 26.0–34.0)
MCHC: 32.1 g/dL (ref 30.0–36.0)
MCV: 94 fL (ref 80.0–100.0)
Platelets: 184 K/uL (ref 150–400)
RBC: 2.52 MIL/uL — ABNORMAL LOW (ref 3.87–5.11)
RDW: 15.1 % (ref 11.5–15.5)
WBC: 6.1 K/uL (ref 4.0–10.5)
nRBC: 0 % (ref 0.0–0.2)

## 2023-09-11 LAB — SAMPLE TO BLOOD BANK

## 2023-09-12 ENCOUNTER — Telehealth: Payer: Self-pay

## 2023-09-12 NOTE — Telephone Encounter (Signed)
 Per Dr. Melanee no blood hgb 7.6.  Outbound call,

## 2023-09-13 ENCOUNTER — Inpatient Hospital Stay

## 2023-09-22 ENCOUNTER — Other Ambulatory Visit: Payer: Self-pay

## 2023-09-22 DIAGNOSIS — Z862 Personal history of diseases of the blood and blood-forming organs and certain disorders involving the immune mechanism: Secondary | ICD-10-CM

## 2023-09-25 ENCOUNTER — Ambulatory Visit: Payer: Self-pay | Admitting: Oncology

## 2023-09-25 ENCOUNTER — Inpatient Hospital Stay

## 2023-09-25 ENCOUNTER — Telehealth: Payer: Self-pay | Admitting: Oncology

## 2023-09-25 DIAGNOSIS — N189 Chronic kidney disease, unspecified: Secondary | ICD-10-CM

## 2023-09-25 DIAGNOSIS — I120 Hypertensive chronic kidney disease with stage 5 chronic kidney disease or end stage renal disease: Secondary | ICD-10-CM | POA: Diagnosis not present

## 2023-09-25 DIAGNOSIS — D649 Anemia, unspecified: Secondary | ICD-10-CM

## 2023-09-25 LAB — CBC (CANCER CENTER ONLY)
HCT: 24.3 % — ABNORMAL LOW (ref 36.0–46.0)
Hemoglobin: 7.8 g/dL — ABNORMAL LOW (ref 12.0–15.0)
MCH: 30.5 pg (ref 26.0–34.0)
MCHC: 32.1 g/dL (ref 30.0–36.0)
MCV: 94.9 fL (ref 80.0–100.0)
Platelet Count: 206 K/uL (ref 150–400)
RBC: 2.56 MIL/uL — ABNORMAL LOW (ref 3.87–5.11)
RDW: 14.3 % (ref 11.5–15.5)
WBC Count: 6.3 K/uL (ref 4.0–10.5)
nRBC: 0 % (ref 0.0–0.2)

## 2023-09-25 LAB — FERRITIN: Ferritin: 1163 ng/mL — ABNORMAL HIGH (ref 11–307)

## 2023-09-25 LAB — IRON AND TIBC
Iron: 60 ug/dL (ref 28–170)
Saturation Ratios: 27 % (ref 10.4–31.8)
TIBC: 223 ug/dL — ABNORMAL LOW (ref 250–450)
UIBC: 163 ug/dL

## 2023-09-25 NOTE — Telephone Encounter (Signed)
 Called pt to let her know her blood transfusion scheduled for this Wednesday 8/20 was not needed and so I had canceled it. I told her to call me back if she had any questions, but that she did not need to come for a transfusion this Wednesday based on her labs.

## 2023-09-27 ENCOUNTER — Inpatient Hospital Stay

## 2023-09-27 ENCOUNTER — Telehealth: Payer: Self-pay | Admitting: Oncology

## 2023-09-27 NOTE — Telephone Encounter (Signed)
 Pt called to see if she had an appt for today as she had not been called for an update.   Pt was scheduled for blood today but it was canceled and the reason was pt did not need  I told pt the appt was canceled and she reiterated that she had not been called and told this information.

## 2023-10-04 ENCOUNTER — Encounter (INDEPENDENT_AMBULATORY_CARE_PROVIDER_SITE_OTHER): Payer: Self-pay | Admitting: Nurse Practitioner

## 2023-10-04 ENCOUNTER — Ambulatory Visit (INDEPENDENT_AMBULATORY_CARE_PROVIDER_SITE_OTHER): Admitting: Nurse Practitioner

## 2023-10-04 ENCOUNTER — Ambulatory Visit (INDEPENDENT_AMBULATORY_CARE_PROVIDER_SITE_OTHER)

## 2023-10-04 VITALS — BP 130/67 | HR 83 | Resp 16 | Wt 170.8 lb

## 2023-10-04 DIAGNOSIS — I10 Essential (primary) hypertension: Secondary | ICD-10-CM | POA: Diagnosis not present

## 2023-10-04 DIAGNOSIS — N186 End stage renal disease: Secondary | ICD-10-CM

## 2023-10-04 DIAGNOSIS — N1831 Chronic kidney disease, stage 3a: Secondary | ICD-10-CM

## 2023-10-04 DIAGNOSIS — E1122 Type 2 diabetes mellitus with diabetic chronic kidney disease: Secondary | ICD-10-CM

## 2023-10-04 DIAGNOSIS — Z794 Long term (current) use of insulin: Secondary | ICD-10-CM

## 2023-10-04 DIAGNOSIS — T82590A Other mechanical complication of surgically created arteriovenous fistula, initial encounter: Secondary | ICD-10-CM

## 2023-10-09 ENCOUNTER — Encounter (INDEPENDENT_AMBULATORY_CARE_PROVIDER_SITE_OTHER): Payer: Self-pay | Admitting: Nurse Practitioner

## 2023-10-09 NOTE — Progress Notes (Signed)
 Subjective:    Patient ID: Heidi [name], female    DOB: [birth year], 68 y.o.   MRN: [id number] Chief Complaint  Patient presents with   Follow-up    3 month HDA    The patient returns to the office for followup of their dialysis access.   The patient reports the function of the access has been stable. Patient denies difficulty with cannulation. The patient denies increased bleeding time after removing the needles. The patient denies hand pain or other symptoms consistent with steal phenomena.  No significant arm swelling.  The patient denies any complaints from the dialysis center or their nephrologist.  The patient denies redness or swelling at the access site. The patient denies fever or chills at home or while on dialysis.  No recent shortening of the patient's walking distance or new symptoms consistent with claudication.  No history of rest pain symptoms. No new ulcers or wounds of the lower extremities have occurred.  The patient denies amaurosis fugax or recent TIA symptoms. There are no recent neurological changes noted. There is no history of DVT, PE or superficial thrombophlebitis. No recent episodes of angina or shortness of breath documented.   Duplex ultrasound of the AV access shows a patent access.  There is a stenosis noted near the antecubital fossa determine stability patient during dialysis.  Flow volume today is 1998 cc/min (previous flow volume was 1205 cc/min)      Review of Systems  Hematological:  Does not bruise/bleed easily.  All other systems reviewed and are negative.      Objective:   Physical Exam Vitals reviewed.  HENT:     Head: Normocephalic.  Cardiovascular:     Rate and Rhythm: Normal rate.     Pulses: Normal pulses.  Pulmonary:     Effort: Pulmonary effort is normal.  Skin:    General: Skin is warm and dry.  Neurological:     Mental Status: She is alert and oriented to person, place, and time.  Psychiatric:        Mood and  Affect: Mood normal.        Behavior: Behavior normal.        Thought Content: Thought content normal.        Judgment: Judgment normal.     BP 130/67   Pulse 83   Resp 16   Wt 170 lb 12.8 oz (77.5 kg)   BMI 29.32 kg/m   Past Medical History:  Diagnosis Date   Allergic rhinitis    Anemia of chronic kidney failure, stage 5 (HCC)    Arthritis    sacroiliac joint   Asthma    Breast mass in female 09/05/2014   Degenerative arthritis of lumbar spine    Diabetes mellitus without complication (HCC)    Dialysis patient (HCC)    ESRD (end stage renal disease) (HCC)    Genu valgus, congenital    GERD (gastroesophageal reflux disease)    Heart murmur    Hyperlipidemia    Hypertension    Hyponatremia    Microalbuminuria    Morbid obesity (HCC)    Pericardial effusion 01/25/2023   Peritoneal dialysis catheter in situ (HCC)    Pes planus    Pneumonia    as a child   Red blood cell antibody positive    Renal disorder    CKD stage 3   Renal insufficiency    Secondary hyperparathyroidism of renal origin (HCC)    Sepsis due to gram-negative UTI (  HCC)    Sleep apnea    does not use cpap   Tricompartment degenerative joint disease of knee    Trochanteric bursitis of both hips    Vitamin D  insufficiency    Volvulus (HCC)     Social History   Socioeconomic History   Marital status: Single    Spouse name: Not on file   Number of children: Not on file   Years of education: Not on file   Highest education level: Not on file  Occupational History   Not on file  Tobacco Use   Smoking status: Never    Passive exposure: Never   Smokeless tobacco: Never  Vaping Use   Vaping status: Never Used  Substance and Sexual Activity   Alcohol use: Yes    Comment: rarely   Drug use: No   Sexual activity: Not on file  Other Topics Concern   Not on file  Social History Narrative   Lives at home with son   Social Drivers of Health   Financial Resource Strain: Low Risk   (08/21/2023)   Received from [facility]   Overall Financial Resource Strain (CARDIA)    Difficulty of Paying Living Expenses: Not hard at all  Food Insecurity: No Food Insecurity (08/21/2023)   Received from [facility]   Hunger Vital Sign    Within the past 12 months, you worried that your food would run out before you got the money to buy more.: Never true    Within the past 12 months, the food you bought just didn't last and you didn't have money to get more.: Never true  Transportation Needs: No Transportation Needs (08/21/2023)   Received from [facility] - Transportation    In the past 12 months, has lack of transportation kept you from medical appointments or from getting medications?: No    Lack of Transportation (Non-Medical): No  Physical Activity: Inactive (03/10/2021)   Received from [facility]   Exercise Vital Sign    On average, how many days per week do you engage in moderate to strenuous exercise (like a brisk walk)?: 0 days    On average, how many minutes do you engage in exercise at this level?: 0 min  Stress: No Stress Concern Present (03/10/2021)   Received from [facility] - Occupational Stress Questionnaire    Feeling of Stress : Only a little  Social Connections: Not on file  Intimate Partner Violence: Not At Risk (01/24/2023)   Humiliation, Afraid, Rape, and Kick questionnaire    Fear of Current or Ex-Partner: No    Emotionally Abused: No    Physically Abused: No    Sexually Abused: No    Past Surgical History:  Procedure Laterality Date   ABDOMINAL HYSTERECTOMY     AV FISTULA PLACEMENT Left 04/20/2023   Procedure: ARTERIOVENOUS (AV) FISTULA CREATION;  Surgeon: Marea Selinda RAMAN, MD;  Location: [facility];  Service: Vascular;  Laterality: Left;   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   BREAST BIOPSY Right 1996   benign    BREAST BIOPSY Right 07/13/2022   MM RT BREAST BX W LOC DEV 1ST LESION IMAGE BX SPEC STEREO GUIDE 07/13/2022 [facility]   BREAST BIOPSY Right 02/27/2023   MM RT BREAST BX W LOC DEV 1ST LESION IMAGE BX SPEC STEREO GUIDE 02/27/2023 [facility]   BREAST BIOPSY Right 02/27/2023  MM RT BREAST BX W LOC DEV EA AD LESION IMG BX SPEC STEREO GUIDE 02/27/2023 [facility]   BREAST EXCISIONAL BIOPSY Right 2016   broken ankle surgery Right 02/2021   CAPD INSERTION N/A 10/07/2021   Procedure: LAPAROSCOPIC INSERTION CONTINUOUS AMBULATORY PERITONEAL DIALYSIS  (CAPD) CATHETER;  Surgeon: Jordis Laneta FALCON, MD;  Location: [facility];  Service: General;  Laterality: N/A;   CAPD REMOVAL N/A 03/31/2023   Procedure: OPEN REMOVAL CONTINUOUS AMBULATORY PERITONEAL DIALYSIS  (CAPD) CATHETER;  Surgeon: Jordis Laneta FALCON, MD;  Location: [facility];  Service: General;  Laterality: N/A;   COLONOSCOPY     COLONOSCOPY N/A 12/19/2019   Procedure: COLONOSCOPY;  Surgeon: Toledo, Ladell POUR, MD;  Location: [facility];  Service: Gastroenterology;  Laterality: N/A;   DIALYSIS/PERMA CATHETER INSERTION N/A 01/30/2023   Procedure: DIALYSIS/PERMA CATHETER INSERTION;  Surgeon: Marea Selinda RAMAN, MD;  Location: [facility];  Service: Cardiovascular;  Laterality: N/A;   EXPLORATORY LAPAROTOMY     of colon for torsion   INSERTION OF MESH  10/07/2021   Procedure: INSERTION OF MESH;  Surgeon: Jordis Laneta FALCON, MD;  Location: [facility];  Service: General;;   NASAL FRACTURE SURGERY     UMBILICAL HERNIA REPAIR N/A 10/07/2021   Procedure: HERNIA REPAIR UMBILICAL ADULT;  Surgeon: Jordis Laneta FALCON, MD;  Location: [facility];  Service: General;  Laterality: N/A;    Family History  Problem Relation Age of Onset   Diabetes Mother    Diabetes Father    Cancer Father    Breast cancer Maternal Aunt     Allergies  Allergen Reactions   Sulfa Antibiotics Nausea And Vomiting   Wound Dressing Adhesive     Site around PD catheter is irritated  from dressing   Misc. Sulfonamide Containing Compounds Nausea And Vomiting       Latest Ref Rng & Units 09/25/2023   10:28 AM 09/11/2023    9:58 AM 08/14/2023   10:57 AM  CBC  WBC 4.0 - 10.5 K/uL 6.3  6.1  6.2   Hemoglobin 12.0 - 15.0 g/dL 7.8  7.6  7.4   Hematocrit 36.0 - 46.0 % 24.3  23.7  23.0   Platelets 150 - 400 K/uL 206  184  209       CMP     Component Value Date/Time   NA 133 (L) 04/20/2023 0648   K 4.3 04/20/2023 0648   CL 96 (L) 04/20/2023 0648   CO2 27 03/31/2023 0746   GLUCOSE 317 (H) 04/20/2023 0648   BUN 50 (H) 04/20/2023 0648   CREATININE 7.00 (H) 04/20/2023 0648   CALCIUM  9.6 03/31/2023 0746   PROT 5.3 (L) 01/24/2023 0445   ALBUMIN  1.9 (L) 01/24/2023 0445   AST 16 01/24/2023 0445   ALT 25 01/24/2023 0445   ALKPHOS 61 01/24/2023 0445   BILITOT 0.3 01/24/2023 0445   GFRNONAA 5 (L) 03/31/2023 0746     No results found.     Assessment & Plan:   1. ESRD (end stage renal disease) (HCC) (Primary) Recommend:  The patient is doing well and currently has adequate dialysis access.  Although there are some parameters suggesting possible future issues.  The patient's dialysis center is not reporting any major access issues.  However, the flow rates in the patient's dialysis access are low, in the prethrombotic range, and there is no exact stenosis identified.  This raises concerns that the access is at moderate but not high risk for a problem or thrombosis and should  be followed more closely  The patient will follow-up with me in the office in 3 months.  The need for a follow up duplex ultrasound will be made at that time based on whether problems with the access are persistent.    2. Essential hypertension Continue antihypertensive medications as already ordered, these medications have been reviewed and there are no changes at this time.  3. Type 2 diabetes mellitus with stage 3a chronic kidney disease, with long-term current use of insulin  (HCC) Continue  hypoglycemic medications as already ordered, these medications have been reviewed and there are no changes at this time.  Hgb A1C to be monitored as already arranged by primary service   Current Outpatient Medications on File Prior to Visit  Medication Sig Dispense Refill   albuterol  (VENTOLIN  HFA) 108 (90 Base) MCG/ACT inhaler Inhale 2 puffs into the lungs every 4 (four) hours as needed for wheezing or shortness of breath.     amLODipine  (NORVASC ) 10 MG tablet Take 10 mg by mouth every morning.     calcitRIOL  (ROCALTROL ) 0.25 MCG capsule Take 0.25 mcg by mouth daily.     carvedilol  (COREG ) 25 MG tablet Take 25 mg by mouth 2 (two) times daily with a meal.     cloNIDine  (CATAPRES ) 0.1 MG tablet Take 0.1 mg by mouth 2 (two) times daily.     cyanocobalamin  1000 MCG tablet Take 1 tablet (1,000 mcg total) by mouth daily. 30 tablet 2   fluticasone  (FLONASE ) 50 MCG/ACT nasal spray Place 2 sprays into both nostrils at bedtime.     folic acid  (FOLVITE ) 1 MG tablet Take 1 tablet (1 mg total) by mouth daily. 30 tablet 2   furosemide  (LASIX ) 80 MG tablet Take 1 tablet (80 mg total) by mouth daily. 30 tablet 2   hydrALAZINE  (APRESOLINE ) 25 MG tablet Take 25 mg by mouth 2 times daily at 12 noon and 4 pm.     Lancets (ONETOUCH DELICA PLUS LANCET30G) MISC      losartan  (COZAAR ) 100 MG tablet Take 100 mg by mouth every morning.     lovastatin (MEVACOR) 20 MG tablet Take 20 mg by mouth at bedtime.     NOVOLIN  70/30 (70-30) 100 UNIT/ML injection Inject 15 Units into the skin 2 (two) times daily with a meal. Reduced from 20 units twice daily.     Vitamin D , Ergocalciferol , (DRISDOL ) 1.25 MG (50000 UNIT) CAPS capsule Take 50,000 Units by mouth every 7 (seven) days. Saturdays     oxyCODONE -acetaminophen  (PERCOCET) 5-325 MG tablet Take 1 tablet by mouth every 4 (four) hours as needed for severe pain (pain score 7-10). (Patient not taking: Reported on 10/04/2023) 20 tablet 0   No current facility-administered  medications on file prior to visit.    There are no Patient Instructions on file for this visit. No follow-ups on file.   Khira Cudmore E Izela Altier, NP

## 2023-10-16 ENCOUNTER — Encounter: Payer: Self-pay | Admitting: Oncology

## 2023-10-16 ENCOUNTER — Inpatient Hospital Stay (HOSPITAL_BASED_OUTPATIENT_CLINIC_OR_DEPARTMENT_OTHER): Admitting: Oncology

## 2023-10-16 ENCOUNTER — Inpatient Hospital Stay: Attending: Internal Medicine

## 2023-10-16 VITALS — BP 139/61 | HR 87 | Temp 97.2°F | Resp 18 | Ht 64.0 in | Wt 172.8 lb

## 2023-10-16 DIAGNOSIS — D631 Anemia in chronic kidney disease: Secondary | ICD-10-CM | POA: Diagnosis not present

## 2023-10-16 DIAGNOSIS — Z803 Family history of malignant neoplasm of breast: Secondary | ICD-10-CM | POA: Insufficient documentation

## 2023-10-16 DIAGNOSIS — N189 Chronic kidney disease, unspecified: Secondary | ICD-10-CM | POA: Diagnosis not present

## 2023-10-16 DIAGNOSIS — Q613 Polycystic kidney, unspecified: Secondary | ICD-10-CM | POA: Insufficient documentation

## 2023-10-16 DIAGNOSIS — N186 End stage renal disease: Secondary | ICD-10-CM | POA: Diagnosis present

## 2023-10-16 DIAGNOSIS — N2889 Other specified disorders of kidney and ureter: Secondary | ICD-10-CM

## 2023-10-16 DIAGNOSIS — Z862 Personal history of diseases of the blood and blood-forming organs and certain disorders involving the immune mechanism: Secondary | ICD-10-CM

## 2023-10-16 DIAGNOSIS — D649 Anemia, unspecified: Secondary | ICD-10-CM

## 2023-10-16 LAB — IRON AND TIBC
Iron: 50 ug/dL (ref 28–170)
Saturation Ratios: 23 % (ref 10.4–31.8)
TIBC: 220 ug/dL — ABNORMAL LOW (ref 250–450)
UIBC: 170 ug/dL

## 2023-10-16 LAB — CBC (CANCER CENTER ONLY)
HCT: 25.2 % — ABNORMAL LOW (ref 36.0–46.0)
Hemoglobin: 8 g/dL — ABNORMAL LOW (ref 12.0–15.0)
MCH: 30.4 pg (ref 26.0–34.0)
MCHC: 31.7 g/dL (ref 30.0–36.0)
MCV: 95.8 fL (ref 80.0–100.0)
Platelet Count: 216 K/uL (ref 150–400)
RBC: 2.63 MIL/uL — ABNORMAL LOW (ref 3.87–5.11)
RDW: 14.3 % (ref 11.5–15.5)
WBC Count: 6.9 K/uL (ref 4.0–10.5)
nRBC: 0 % (ref 0.0–0.2)

## 2023-10-16 LAB — SAMPLE TO BLOOD BANK

## 2023-10-16 LAB — FERRITIN: Ferritin: 1193 ng/mL — ABNORMAL HIGH (ref 11–307)

## 2023-10-16 NOTE — Progress Notes (Signed)
 Patient doing pretty good, with no new or acute concerns at this time.

## 2023-10-16 NOTE — Progress Notes (Signed)
 Hematology/Oncology Consult note [facility]  Telephone:([phone] [phone]  Patient Care Team: Zachary Idelia LABOR, MD as PCP - General (Family Medicine) George, Sionne A, MD (Family Medicine) Melanee Annah BROCKS, MD as Consulting Physician (Oncology)   Name of the patient: Heidi [name]  [id number]  11/26/1955   Date of visit: 10/16/23  Diagnosis-anemia of chronic kidney disease  Chief complaint/ Reason for visit-routine follow-up of anemia  Heme/Onc history: Patient is a 68 year old female with a past medical history significant for hypertension hyperlipidemia GERD osteoarthritis and history of end-stage renal disease on hemodialysis presenting to the ER on 01/24/2023 with symptoms of worsening shortness of breath and lower extremity edema.  Her baseline hemoglobin since January 2023 has been fluctuating between 7-8.  Last outpatient hemoglobin on 10/13/2022 was 8.4 on admission to the hospital on 01/23/2023 her hemoglobin was 7.1 and she has received multiple units of PRBC transfusion and her hemoglobin has remained around 7.  Today it is 8.5.  Ferritin levels on 01/24/2023 were elevated at 1379 with normal iron studies and an iron saturation of 28% B12 level normal at 338 Free light chain both kappa and lambda were elevated with a normal free light chain ratio.  SPEP showed no M protein. Folate normal at 10.1.    Patient was previously evaluated by transplant team at [facility] but more recently patient states that she has declined transplant she was also following up with urology [facility] and was last seen in June 2024.  She had a CT abdomen with and without contrast in May 2024 which showed polycystic kidneys with bilateral renal cysts some of which demonstrate internal enhancement concerning for renal cell carcinoma and a 1.9 cm adrenal nodule.  Continued surveillance was recommended at that time    Interval history-patient reports feeling well presently.  Overall  mild fatigue.  Denies any blood loss in her stool or urine.  ECOG PS- 1 Pain scale- 0   Review of systems- Review of Systems  Constitutional:  Positive for malaise/fatigue. Negative for chills, fever and weight loss.  HENT:  Negative for congestion, ear discharge and nosebleeds.   Eyes:  Negative for blurred vision.  Respiratory:  Negative for cough, hemoptysis, sputum production, shortness of breath and wheezing.   Cardiovascular:  Negative for chest pain, palpitations, orthopnea and claudication.  Gastrointestinal:  Negative for abdominal pain, blood in stool, constipation, diarrhea, heartburn, melena, nausea and vomiting.  Genitourinary:  Negative for dysuria, flank pain, frequency, hematuria and urgency.  Musculoskeletal:  Negative for back pain, joint pain and myalgias.  Skin:  Negative for rash.  Neurological:  Negative for dizziness, tingling, focal weakness, seizures, weakness and headaches.  Endo/Heme/Allergies:  Does not bruise/bleed easily.  Psychiatric/Behavioral:  Negative for depression and suicidal ideas. The patient does not have insomnia.       Allergies  Allergen Reactions   Sulfa Antibiotics Nausea And Vomiting   Wound Dressing Adhesive     Site around PD catheter is irritated from dressing   Misc. Sulfonamide Containing Compounds Nausea And Vomiting     Past Medical History:  Diagnosis Date   Allergic rhinitis    Anemia of chronic kidney failure, stage 5 (HCC)    Arthritis    sacroiliac joint   Asthma    Breast mass in female 09/05/2014   Degenerative arthritis of lumbar spine    Diabetes mellitus without complication [facility])    Dialysis patient (HCC)    ESRD (end stage renal disease) (HCC)  Genu valgus, congenital    GERD (gastroesophageal reflux disease)    Heart murmur    Hyperlipidemia    Hypertension    Hyponatremia    Microalbuminuria    Morbid obesity (HCC)    Pericardial effusion 01/25/2023   Peritoneal dialysis catheter in situ (HCC)     Pes planus    Pneumonia    as a child   Red blood cell antibody positive    Renal disorder    CKD stage 3   Renal insufficiency    Secondary hyperparathyroidism of renal origin (HCC)    Sepsis due to gram-negative UTI (HCC)    Sleep apnea    does not use cpap   Tricompartment degenerative joint disease of knee    Trochanteric bursitis of both hips    Vitamin D  insufficiency    Volvulus (HCC)      Past Surgical History:  Procedure Laterality Date   ABDOMINAL HYSTERECTOMY     AV FISTULA PLACEMENT Left 04/20/2023   Procedure: ARTERIOVENOUS (AV) FISTULA CREATION;  Surgeon: Marea Selinda RAMAN, MD;  Location: [facility] ORS;  Service: Vascular;  Laterality: Left;   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   BREAST BIOPSY Right 1996   benign   BREAST BIOPSY Right 07/13/2022   MM RT BREAST BX W LOC DEV 1ST LESION IMAGE BX SPEC STEREO GUIDE 07/13/2022 [facility]   BREAST BIOPSY Right 02/27/2023   MM RT BREAST BX W LOC DEV 1ST LESION IMAGE BX SPEC STEREO GUIDE 02/27/2023 [facility]   BREAST BIOPSY Right 02/27/2023   MM RT BREAST BX W LOC DEV EA AD LESION IMG BX SPEC STEREO GUIDE 02/27/2023 [facility]   BREAST EXCISIONAL BIOPSY Right 2016   broken ankle surgery Right 02/2021   CAPD INSERTION N/A 10/07/2021   Procedure: LAPAROSCOPIC INSERTION CONTINUOUS AMBULATORY PERITONEAL DIALYSIS  (CAPD) CATHETER;  Surgeon: Jordis Laneta FALCON, MD;  Location: [facility];  Service: General;  Laterality: N/A;   CAPD REMOVAL N/A 03/31/2023   Procedure: OPEN REMOVAL CONTINUOUS AMBULATORY PERITONEAL DIALYSIS  (CAPD) CATHETER;  Surgeon: Jordis Laneta FALCON, MD;  Location: [facility];  Service: General;  Laterality: N/A;   COLONOSCOPY     COLONOSCOPY N/A 12/19/2019   Procedure: COLONOSCOPY;  Surgeon: Toledo, Ladell POUR, MD;  Location: [facility];  Service: Gastroenterology;  Laterality: N/A;   DIALYSIS/PERMA CATHETER INSERTION N/A 01/30/2023   Procedure: DIALYSIS/PERMA CATHETER INSERTION;  Surgeon: Marea Selinda RAMAN,  MD;  Location: [facility];  Service: Cardiovascular;  Laterality: N/A;   EXPLORATORY LAPAROTOMY     of [name] for torsion   INSERTION OF MESH  10/07/2021   Procedure: INSERTION OF MESH;  Surgeon: Jordis Laneta FALCON, MD;  Location: [facility];  Service: General;;   NASAL FRACTURE SURGERY     UMBILICAL HERNIA REPAIR N/A 10/07/2021   Procedure: HERNIA REPAIR UMBILICAL ADULT;  Surgeon: Jordis Laneta FALCON, MD;  Location: [facility];  Service: General;  Laterality: N/A;    Social History   Socioeconomic History   Marital status: Single    Spouse name: Not on file   Number of children: Not on file   Years of education: Not on file   Highest education level: Not on file  Occupational History   Not on file  Tobacco Use   Smoking status: Never    Passive exposure: Never   Smokeless tobacco: Never  Vaping Use   Vaping status: Never Used  Substance and Sexual Activity   Alcohol use: Yes    Comment:  rarely   Drug use: No   Sexual activity: Not on file  Other Topics Concern   Not on file  Social History Narrative   Lives at home with son   Social Drivers of Health   Financial Resource Strain: Low Risk  (10/12/2023)   Received from [facility]   Overall Financial Resource Strain (CARDIA)    Difficulty of Paying Living Expenses: Not hard at all  Food Insecurity: No Food Insecurity (10/12/2023)   Received from [facility]   Hunger Vital Sign    Within the past 12 months, you worried that your food would run out before you got the money to buy more.: Never true    Within the past 12 months, the food you bought just didn't last and you didn't have money to get more.: Never true  Transportation Needs: No Transportation Needs (10/12/2023)   Received from [facility] - Transportation    In the past 12 months, has lack of transportation kept you from medical appointments or from getting medications?: No    Lack of Transportation  (Non-Medical): No  Physical Activity: Inactive (03/10/2021)   Received from [facility]   Exercise Vital Sign    On average, how many days per week do you engage in moderate to strenuous exercise (like a brisk walk)?: 0 days    On average, how many minutes do you engage in exercise at this level?: 0 min  Stress: No Stress Concern Present (03/10/2021)   Received from [facility] - Occupational Stress Questionnaire    Feeling of Stress : Only a little  Social Connections: Not on file  Intimate Partner Violence: Not At Risk (01/24/2023)   Humiliation, Afraid, Rape, and Kick questionnaire    Fear of Current or Ex-Partner: No    Emotionally Abused: No    Physically Abused: No    Sexually Abused: No    Family History  Problem Relation Age of Onset   Diabetes Mother    Diabetes Father    Cancer Father    Breast cancer Maternal Aunt      Current Outpatient Medications:    albuterol  (VENTOLIN  HFA) 108 (90 Base) MCG/ACT inhaler, Inhale 2 puffs into the lungs every 4 (four) hours as needed for wheezing or shortness of breath., Disp: , Rfl:    amLODipine  (NORVASC ) 10 MG tablet, Take 10 mg by mouth every morning., Disp: , Rfl:    calcitRIOL  (ROCALTROL ) 0.25 MCG capsule, Take 0.25 mcg by mouth daily., Disp: , Rfl:    carvedilol  (COREG ) 25 MG tablet, Take 25 mg by mouth 2 (two) times daily with a meal., Disp: , Rfl:    cloNIDine  (CATAPRES ) 0.1 MG tablet, Take 0.1 mg by mouth 2 (two) times daily., Disp: , Rfl:    cyanocobalamin  1000 MCG tablet, Take 1 tablet (1,000 mcg total) by mouth daily., Disp: 30 tablet, Rfl: 2   fluticasone  (FLONASE ) 50 MCG/ACT nasal spray, Place 2 sprays into both nostrils at bedtime., Disp: , Rfl:    folic acid  (FOLVITE ) 1 MG tablet, Take 1 tablet (1 mg total) by mouth daily., Disp: 30 tablet, Rfl: 2   furosemide  (LASIX ) 80 MG tablet, Take 1 tablet (80 mg total) by mouth daily., Disp: 30 tablet,  Rfl: 2   hydrALAZINE  (APRESOLINE ) 25 MG tablet, Take 25 mg by mouth 2 times daily at 12 noon and 4 pm., Disp: ,  Rfl:    Lancets (ONETOUCH DELICA PLUS LANCET30G) MISC, , Disp: , Rfl:    losartan  (COZAAR ) 100 MG tablet, Take 100 mg by mouth every morning., Disp: , Rfl:    lovastatin (MEVACOR) 20 MG tablet, Take 20 mg by mouth at bedtime., Disp: , Rfl:    NOVOLIN  70/30 (70-30) 100 UNIT/ML injection, Inject 15 Units into the skin 2 (two) times daily with a meal. Reduced from 20 units twice daily., Disp: , Rfl:    Vitamin D , Ergocalciferol , (DRISDOL ) 1.25 MG (50000 UNIT) CAPS capsule, Take 50,000 Units by mouth every 7 (seven) days. Saturdays, Disp: , Rfl:    oxyCODONE -acetaminophen  (PERCOCET) 5-325 MG tablet, Take 1 tablet by mouth every 4 (four) hours as needed for severe pain (pain score 7-10). (Patient not taking: Reported on 10/04/2023), Disp: 20 tablet, Rfl: 0  Physical exam:  Vitals:   10/16/23 1114  BP: 139/61  Pulse: 87  Resp: 18  Temp: (!) 97.2 F (36.2 C)  TempSrc: Tympanic  SpO2: 100%  Weight: 172 lb 12.8 oz (78.4 kg)  Height: 5' 4 (1.626 m)   Physical Exam Constitutional:      Comments: Ambulates with a cane.  Appears in no acute distress  Cardiovascular:     Rate and Rhythm: Normal rate and regular rhythm.     Heart sounds: Normal heart sounds.  Pulmonary:     Effort: Pulmonary effort is normal.     Breath sounds: Normal breath sounds.  Abdominal:     General: Bowel sounds are normal.     Palpations: Abdomen is soft.  Skin:    General: Skin is warm and dry.  Neurological:     Mental Status: She is alert and oriented to person, place, and time.      I have personally reviewed labs listed below:    Latest Ref Rng & Units 04/20/2023    6:48 AM  CMP  Glucose 70 - 99 mg/dL 682   BUN 8 - 23 mg/dL 50   Creatinine 9.55 - 1.00 mg/dL 2.99   Sodium 864 - 854 mmol/L 133   Potassium 3.5 - 5.1 mmol/L 4.3   Chloride 98 - 111 mmol/L 96       Latest Ref Rng & Units  10/16/2023   10:55 AM  CBC  WBC 4.0 - 10.5 K/uL 6.9   Hemoglobin 12.0 - 15.0 g/dL 8.0   Hematocrit 63.9 - 46.0 % 25.2   Platelets 150 - 400 K/uL 216    I have personally reviewed Radiology images listed below: No images are attached to the encounter.  VAS US  DUPLEX DIALYSIS ACCESS (AVF, AVG) Result Date: 10/10/2023 DIALYSIS ACCESS Patient Name:  ROSAMOND ANDRESS  Date of Exam:   10/04/2023 Medical Rec #: [id number]        Accession #:    [id number] Date of Birth: 09/14/1955         Patient Gender: F Patient Age:   61 years Exam Location:  [facility] Procedure:      VAS US  DUPLEX DIALYSIS ACCESS (AVF, AVG) Referring Phys: ORVIN DARING --------------------------------------------------------------------------------  Reason for Exam: Routine follow up. Access Site: Left Upper Extremity. Access Type: Brachial-cephalic AVF. History: Whistling heard in lower access. Performing Technologist: Donnice Charnley RVT  Examination Guidelines: A complete evaluation includes B-mode imaging, spectral Doppler, color Doppler, and power Doppler as needed of all accessible portions of each vessel. Unilateral testing is considered an integral part of a complete examination. Limited examinations for reoccurring indications  may be performed as noted.  Findings: +--------------------+----------+-----------------+--------+ AVF                 PSV (cm/s)Flow Vol (mL/min)Comments +--------------------+----------+-----------------+--------+ Native artery inflow   210          1998                +--------------------+----------+-----------------+--------+ AVF Anastomosis        483                              +--------------------+----------+-----------------+--------+  +---------------+----------+-------------+----------+------------------+ OUTFLOW VEIN   PSV (cm/s)Diameter (cm)Depth (cm)     Describe      +---------------+----------+-------------+----------+------------------+ Subclavian vein   300                                               +---------------+----------+-------------+----------+------------------+ Confluence        180        0.74                                  +---------------+----------+-------------+----------+------------------+ Clavicle          316        0.69                                  +---------------+----------+-------------+----------+------------------+ Shoulder          137        0.75                                  +---------------+----------+-------------+----------+------------------+ Prox UA           171        0.65                                  +---------------+----------+-------------+----------+------------------+ Mid UA            158        0.99                                  +---------------+----------+-------------+----------+------------------+ Dist UA           140        0.73                                  +---------------+----------+-------------+----------+------------------+ AC Fossa          604        0.98               change in Diameter +---------------+----------+-------------+----------+------------------+ Velocities at the antecubital fossa segment of the outflow vein increase from 280 cm/sec to 604 cm/sec.  +----------------+-------------+---------+---------+----------+----------------+                 Diameter (cm)  Depth  BranchingPSV (cm/s)  Flow Volume                                   (  cm)                          (ml/min)     +----------------+-------------+---------+---------+----------+----------------+ Left radial                                        74                     artery                                                                    +----------------+-------------+---------+---------+----------+----------------+  Summary: Arteriovenous fistula-Elevated velocities and a velocity ratio of greater than 2 noted. *See table(s) above for measurements  and observations.  Diagnosing physician: Cordella Shawl MD Electronically signed by Cordella Shawl MD on 10/10/2023 at 9:46:11 AM.   --------------------------------------------------------------------------------   Final      Assessment and plan- Patient is a 68 y.o. female with history of anemia of chronic kidney disease here for routine follow-up  Patient has end-stage renal diseaseAnd is on hemodialysis.  Anemia workup was consistent with anemia secondary to CKD but patient has not been able to receive EPO given that she has bilateral polycystic kidney disease and concerns for small bilateral renal cell carcinoma.  She was referred in the past to urology for consideration of bilateral nephrectomy and renal transplant which she refused.  I have reviewed CT abdomen and pelvis images independently and discussed findings with the patient which shows overall stable bilateral renal lesions likely renal cell carcinomas and continued surveillance was recommended.  I will consider getting scans on a yearly basis.  With regards to her anemia we have 3 options:  Proceed with bilateral nephrectomy and then start EPO Proceed with EPO now but there are potential risks for her renal cell carcinoma can potentially increase in size Not proceed with EPO and continue with supportive transfusions for now.  This carries a risk of iron overload.  Patient does not wish to proceed with nephrectomy at this time.  She also does not wish to proceed with epo at this time.  She has not received blood transfusion for over 2 months now.  Hemoglobin remains between 7-8.  She does not require any blood transfusion today.  If her blood transfusion needs increased to once a month or higher she is willing to consider EPO at that time  H&H and possible transfusion every 4 weeks.  See me in 3 months with CBC ferritin and iron studies   Visit Diagnosis 1. History of anemia due to chronic kidney disease      Dr. Annah Skene, MD,  [facility] [phone] 10/16/2023 1:32 PM

## 2023-10-18 ENCOUNTER — Inpatient Hospital Stay

## 2023-10-30 ENCOUNTER — Inpatient Hospital Stay

## 2023-11-01 ENCOUNTER — Inpatient Hospital Stay

## 2023-11-13 ENCOUNTER — Inpatient Hospital Stay: Attending: Internal Medicine

## 2023-11-13 DIAGNOSIS — Z862 Personal history of diseases of the blood and blood-forming organs and certain disorders involving the immune mechanism: Secondary | ICD-10-CM

## 2023-11-13 DIAGNOSIS — N186 End stage renal disease: Secondary | ICD-10-CM | POA: Insufficient documentation

## 2023-11-13 LAB — SAMPLE TO BLOOD BANK

## 2023-11-13 LAB — HEMOGLOBIN AND HEMATOCRIT (CANCER CENTER ONLY)
HCT: 24.2 % — ABNORMAL LOW (ref 36.0–46.0)
Hemoglobin: 7.8 g/dL — ABNORMAL LOW (ref 12.0–15.0)

## 2023-11-14 ENCOUNTER — Telehealth: Payer: Self-pay

## 2023-11-14 NOTE — Telephone Encounter (Signed)
 hgb 7.8, hct 24.2 done on 10/6.  Per Dr. Melanee no blood.  Outbound call to patient to inform no blood transfusion needed for tomorrow 10/8 at 10am.  Patient verbalized understanding.

## 2023-11-15 ENCOUNTER — Inpatient Hospital Stay

## 2023-11-27 ENCOUNTER — Inpatient Hospital Stay

## 2023-11-29 ENCOUNTER — Inpatient Hospital Stay

## 2023-12-11 ENCOUNTER — Ambulatory Visit: Payer: Self-pay | Admitting: Oncology

## 2023-12-11 ENCOUNTER — Inpatient Hospital Stay: Attending: Internal Medicine

## 2023-12-11 DIAGNOSIS — Z862 Personal history of diseases of the blood and blood-forming organs and certain disorders involving the immune mechanism: Secondary | ICD-10-CM

## 2023-12-11 LAB — HEMOGLOBIN AND HEMATOCRIT (CANCER CENTER ONLY)
HCT: 26.1 % — ABNORMAL LOW (ref 36.0–46.0)
Hemoglobin: 8.3 g/dL — ABNORMAL LOW (ref 12.0–15.0)

## 2023-12-11 LAB — SAMPLE TO BLOOD BANK

## 2023-12-12 NOTE — Progress Notes (Signed)
 Appointment was cancelled.  Checked with scheduling and it appears patient did not present to clinic for blood transfusion today 12/12/23.

## 2023-12-13 ENCOUNTER — Inpatient Hospital Stay

## 2023-12-25 ENCOUNTER — Inpatient Hospital Stay

## 2023-12-27 ENCOUNTER — Inpatient Hospital Stay

## 2024-01-02 ENCOUNTER — Other Ambulatory Visit (INDEPENDENT_AMBULATORY_CARE_PROVIDER_SITE_OTHER): Payer: Self-pay | Admitting: Nurse Practitioner

## 2024-01-02 DIAGNOSIS — N186 End stage renal disease: Secondary | ICD-10-CM

## 2024-01-08 ENCOUNTER — Ambulatory Visit (INDEPENDENT_AMBULATORY_CARE_PROVIDER_SITE_OTHER): Admitting: Nurse Practitioner

## 2024-01-08 ENCOUNTER — Encounter (INDEPENDENT_AMBULATORY_CARE_PROVIDER_SITE_OTHER)

## 2024-01-10 ENCOUNTER — Other Ambulatory Visit: Payer: Self-pay

## 2024-01-10 ENCOUNTER — Encounter: Payer: Self-pay | Admitting: Oncology

## 2024-01-10 ENCOUNTER — Inpatient Hospital Stay: Admitting: Oncology

## 2024-01-10 ENCOUNTER — Inpatient Hospital Stay: Attending: Internal Medicine

## 2024-01-10 DIAGNOSIS — I3139 Other pericardial effusion (noninflammatory): Secondary | ICD-10-CM | POA: Insufficient documentation

## 2024-01-10 DIAGNOSIS — Z79899 Other long term (current) drug therapy: Secondary | ICD-10-CM | POA: Diagnosis not present

## 2024-01-10 DIAGNOSIS — C649 Malignant neoplasm of unspecified kidney, except renal pelvis: Secondary | ICD-10-CM | POA: Insufficient documentation

## 2024-01-10 DIAGNOSIS — N186 End stage renal disease: Secondary | ICD-10-CM | POA: Diagnosis not present

## 2024-01-10 DIAGNOSIS — Z862 Personal history of diseases of the blood and blood-forming organs and certain disorders involving the immune mechanism: Secondary | ICD-10-CM

## 2024-01-10 DIAGNOSIS — Z8701 Personal history of pneumonia (recurrent): Secondary | ICD-10-CM | POA: Insufficient documentation

## 2024-01-10 DIAGNOSIS — N189 Chronic kidney disease, unspecified: Secondary | ICD-10-CM | POA: Diagnosis not present

## 2024-01-10 DIAGNOSIS — Z803 Family history of malignant neoplasm of breast: Secondary | ICD-10-CM | POA: Insufficient documentation

## 2024-01-10 DIAGNOSIS — E785 Hyperlipidemia, unspecified: Secondary | ICD-10-CM | POA: Insufficient documentation

## 2024-01-10 DIAGNOSIS — Z794 Long term (current) use of insulin: Secondary | ICD-10-CM | POA: Diagnosis not present

## 2024-01-10 DIAGNOSIS — M1990 Unspecified osteoarthritis, unspecified site: Secondary | ICD-10-CM | POA: Diagnosis not present

## 2024-01-10 DIAGNOSIS — E1122 Type 2 diabetes mellitus with diabetic chronic kidney disease: Secondary | ICD-10-CM | POA: Diagnosis not present

## 2024-01-10 DIAGNOSIS — R011 Cardiac murmur, unspecified: Secondary | ICD-10-CM | POA: Insufficient documentation

## 2024-01-10 DIAGNOSIS — Z992 Dependence on renal dialysis: Secondary | ICD-10-CM | POA: Insufficient documentation

## 2024-01-10 DIAGNOSIS — N2581 Secondary hyperparathyroidism of renal origin: Secondary | ICD-10-CM | POA: Insufficient documentation

## 2024-01-10 DIAGNOSIS — D631 Anemia in chronic kidney disease: Secondary | ICD-10-CM | POA: Diagnosis not present

## 2024-01-10 DIAGNOSIS — I132 Hypertensive heart and chronic kidney disease with heart failure and with stage 5 chronic kidney disease, or end stage renal disease: Secondary | ICD-10-CM | POA: Insufficient documentation

## 2024-01-10 DIAGNOSIS — K219 Gastro-esophageal reflux disease without esophagitis: Secondary | ICD-10-CM | POA: Diagnosis not present

## 2024-01-10 LAB — CBC WITH DIFFERENTIAL (CANCER CENTER ONLY)
Abs Immature Granulocytes: 0.01 K/uL (ref 0.00–0.07)
Basophils Absolute: 0.1 K/uL (ref 0.0–0.1)
Basophils Relative: 1 %
Eosinophils Absolute: 0.1 K/uL (ref 0.0–0.5)
Eosinophils Relative: 1 %
HCT: 26.5 % — ABNORMAL LOW (ref 36.0–46.0)
Hemoglobin: 8.6 g/dL — ABNORMAL LOW (ref 12.0–15.0)
Immature Granulocytes: 0 %
Lymphocytes Relative: 26 %
Lymphs Abs: 1.6 K/uL (ref 0.7–4.0)
MCH: 30.6 pg (ref 26.0–34.0)
MCHC: 32.5 g/dL (ref 30.0–36.0)
MCV: 94.3 fL (ref 80.0–100.0)
Monocytes Absolute: 0.5 K/uL (ref 0.1–1.0)
Monocytes Relative: 9 %
Neutro Abs: 3.9 K/uL (ref 1.7–7.7)
Neutrophils Relative %: 63 %
Platelet Count: 223 K/uL (ref 150–400)
RBC: 2.81 MIL/uL — ABNORMAL LOW (ref 3.87–5.11)
RDW: 13.8 % (ref 11.5–15.5)
WBC Count: 6.1 K/uL (ref 4.0–10.5)
nRBC: 0 % (ref 0.0–0.2)

## 2024-01-10 LAB — FERRITIN: Ferritin: 1429 ng/mL — ABNORMAL HIGH (ref 11–307)

## 2024-01-10 LAB — IRON AND TIBC
Iron: 76 ug/dL (ref 28–170)
Saturation Ratios: 31 % (ref 10.4–31.8)
TIBC: 248 ug/dL — ABNORMAL LOW (ref 250–450)
UIBC: 172 ug/dL

## 2024-01-10 NOTE — Progress Notes (Signed)
 both hands go numb on/off.  Patient slipped on ramp this morning, says there's no anti-slip covering.  Took 15 minutes to get back up, leaves for dialysis at 5:15am. Denies head injury.

## 2024-01-10 NOTE — Progress Notes (Signed)
 Hematology/Oncology Consult note [facility]  Telephone:([phone] [phone]  Patient Care Team: Zachary Idelia LABOR, MD as PCP - General (Family Medicine) George, Sionne A, MD (Family Medicine) Melanee Annah BROCKS, MD as Consulting Physician (Oncology)   Name of the patient: Heidi [name]  [id number]  1955-06-13   Date of visit: 01/10/24  Diagnosis-anemia of chronic kidney disease  Chief complaint/ Reason for visit-routine follow-up of anemia  Heme/Onc history:  Patient is a 68 year old female with a past medical history significant for hypertension hyperlipidemia GERD osteoarthritis and history of end-stage renal disease on hemodialysis presenting to the ER on 01/24/2023 with symptoms of worsening shortness of breath and lower extremity edema.  Her baseline hemoglobin since January 2023 has been fluctuating between 7-8.  Last outpatient hemoglobin on 10/13/2022 was 8.4 on admission to the hospital on 01/23/2023 her hemoglobin was 7.1 and she has received multiple units of PRBC transfusion and her hemoglobin has remained around 7.  Today it is 8.5.  Ferritin levels on 01/24/2023 were elevated at 1379 with normal iron studies and an iron saturation of 28% B12 level normal at 338 Free light chain both kappa and lambda were elevated with a normal free light chain ratio.  SPEP showed no M protein. Folate normal at 10.1.    Patient was previously evaluated by transplant team at [facility] but more recently patient states that she has declined transplant she was also following up with urology [facility] and was last seen in June 2024.  She had a CT abdomen with and without contrast in May 2024 which showed polycystic kidneys with bilateral renal cysts some of which demonstrate internal enhancement concerning for renal cell carcinoma and a 1.9 cm adrenal nodule.  Continued surveillance was recommended at that time    Interval history-patient had a fall this morning at home before  coming to the clinic.  She reports no significant injuries.  Energy levels are stable and she has baseline fatigue  ECOG PS- 2 Pain scale- 0   Review of systems- Review of Systems  Constitutional:  Positive for malaise/fatigue. Negative for chills, fever and weight loss.  HENT:  Negative for congestion, ear discharge and nosebleeds.   Eyes:  Negative for blurred vision.  Respiratory:  Negative for cough, hemoptysis, sputum production, shortness of breath and wheezing.   Cardiovascular:  Negative for chest pain, palpitations, orthopnea and claudication.  Gastrointestinal:  Negative for abdominal pain, blood in stool, constipation, diarrhea, heartburn, melena, nausea and vomiting.  Genitourinary:  Negative for dysuria, flank pain, frequency, hematuria and urgency.  Musculoskeletal:  Negative for back pain, joint pain and myalgias.  Skin:  Negative for rash.  Neurological:  Negative for dizziness, tingling, focal weakness, seizures, weakness and headaches.  Endo/Heme/Allergies:  Does not bruise/bleed easily.  Psychiatric/Behavioral:  Negative for depression and suicidal ideas. The patient does not have insomnia.       Allergies  Allergen Reactions   Sulfa Antibiotics Nausea And Vomiting   Wound Dressing Adhesive     Site around PD catheter is irritated from dressing   Misc. Sulfonamide Containing Compounds Nausea And Vomiting     Past Medical History:  Diagnosis Date   Allergic rhinitis    Anemia of chronic kidney failure, stage 5 (HCC)    Arthritis    sacroiliac joint   Asthma    Breast mass in female 09/05/2014   Degenerative arthritis of lumbar spine    Diabetes mellitus without complication [facility])    Dialysis  patient    ESRD (end stage renal disease) (HCC)    Genu valgus, congenital    GERD (gastroesophageal reflux disease)    Heart murmur    Hyperlipidemia    Hypertension    Hyponatremia    Microalbuminuria    Morbid obesity (HCC)    Pericardial effusion 01/25/2023    Peritoneal dialysis catheter in situ    Pes planus    Pneumonia    as a child   Red blood cell antibody positive    Renal disorder    CKD stage 3   Renal insufficiency    Secondary hyperparathyroidism of renal origin    Sepsis due to gram-negative UTI (HCC)    Sleep apnea    does not use cpap   Tricompartment degenerative joint disease of knee    Trochanteric bursitis of both hips    Vitamin D  insufficiency    Volvulus (HCC)      Past Surgical History:  Procedure Laterality Date   ABDOMINAL HYSTERECTOMY     AV FISTULA PLACEMENT Left 04/20/2023   Procedure: ARTERIOVENOUS (AV) FISTULA CREATION;  Surgeon: Marea Selinda RAMAN, MD;  Location: [facility];  Service: Vascular;  Laterality: Left;   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   BREAST BIOPSY Right 1996   benign   BREAST BIOPSY Right 07/13/2022   MM RT BREAST BX W LOC DEV 1ST LESION IMAGE BX SPEC STEREO GUIDE 07/13/2022 [facility]   BREAST BIOPSY Right 02/27/2023   MM RT BREAST BX W LOC DEV 1ST LESION IMAGE BX SPEC STEREO GUIDE 02/27/2023 [facility]   BREAST BIOPSY Right 02/27/2023   MM RT BREAST BX W LOC DEV EA AD LESION IMG BX SPEC STEREO GUIDE 02/27/2023 [facility]   BREAST EXCISIONAL BIOPSY Right 2016   broken ankle surgery Right 02/2021   CAPD INSERTION N/A 10/07/2021   Procedure: LAPAROSCOPIC INSERTION CONTINUOUS AMBULATORY PERITONEAL DIALYSIS  (CAPD) CATHETER;  Surgeon: Jordis Laneta FALCON, MD;  Location: [facility];  Service: General;  Laterality: N/A;   CAPD REMOVAL N/A 03/31/2023   Procedure: OPEN REMOVAL CONTINUOUS AMBULATORY PERITONEAL DIALYSIS  (CAPD) CATHETER;  Surgeon: Jordis Laneta FALCON, MD;  Location: [facility];  Service: General;  Laterality: N/A;   COLONOSCOPY     COLONOSCOPY N/A 12/19/2019   Procedure: COLONOSCOPY;  Surgeon: Toledo, Ladell POUR, MD;  Location: [facility];  Service: Gastroenterology;  Laterality: N/A;   DIALYSIS/PERMA CATHETER INSERTION N/A 01/30/2023   Procedure: DIALYSIS/PERMA CATHETER  INSERTION;  Surgeon: Marea Selinda RAMAN, MD;  Location: [facility];  Service: Cardiovascular;  Laterality: N/A;   EXPLORATORY LAPAROTOMY     of colon for torsion   INSERTION OF MESH  10/07/2021   Procedure: INSERTION OF MESH;  Surgeon: Jordis Laneta FALCON, MD;  Location: [facility];  Service: General;;   NASAL FRACTURE SURGERY     UMBILICAL HERNIA REPAIR N/A 10/07/2021   Procedure: HERNIA REPAIR UMBILICAL ADULT;  Surgeon: Jordis Laneta FALCON, MD;  Location: [facility];  Service: General;  Laterality: N/A;    Social History   Socioeconomic History   Marital status: Single    Spouse name: Not on file   Number of children: Not on file   Years of education: Not on file   Highest education level: Not on file  Occupational History   Not on file  Tobacco Use   Smoking status: Never    Passive exposure: Never   Smokeless tobacco: Never  Vaping Use   Vaping status: Never Used  Substance and  Sexual Activity   Alcohol use: Yes    Comment: rarely   Drug use: No   Sexual activity: Not on file  Other Topics Concern   Not on file  Social History Narrative   Lives at home with son   Social Drivers of Health   Financial Resource Strain: Low Risk  (10/12/2023)   Received from [facility]   Overall Financial Resource Strain (CARDIA)    Difficulty of Paying Living Expenses: Not hard at all  Food Insecurity: No Food Insecurity (10/12/2023)   Received from [facility]   Hunger Vital Sign    Within the past 12 months, you worried that your food would run out before you got the money to buy more.: Never true    Within the past 12 months, the food you bought just didn't last and you didn't have money to get more.: Never true  Transportation Needs: No Transportation Needs (10/12/2023)   Received from [facility]   PRAPARE - Transportation    Lack of Transportation (Non-Medical): No    In the past 12 months, has lack of transportation kept you from  medical appointments or from getting medications?: No  Physical Activity: Inactive (03/10/2021)   Received from [facility]   Exercise Vital Sign    On average, how many days per week do you engage in moderate to strenuous exercise (like a brisk walk)?: 0 days    On average, how many minutes do you engage in exercise at this level?: 0 min  Stress: No Stress Concern Present (03/10/2021)   Received from [facility] of Occupational Health - Occupational Stress Questionnaire    Feeling of Stress : Only a little  Social Connections: Not on file  Intimate Partner Violence: Not At Risk (01/24/2023)   Humiliation, Afraid, Rape, and Kick questionnaire    Fear of Current or Ex-Partner: No    Emotionally Abused: No    Physically Abused: No    Sexually Abused: No    Family History  Problem Relation Age of Onset   Diabetes Mother    Diabetes Father    Cancer Father    Breast cancer Maternal Aunt      Current Outpatient Medications:    albuterol  (VENTOLIN  HFA) 108 (90 Base) MCG/ACT inhaler, Inhale 2 puffs into the lungs every 4 (four) hours as needed for wheezing or shortness of breath., Disp: , Rfl:    amLODipine  (NORVASC ) 10 MG tablet, Take 10 mg by mouth every morning., Disp: , Rfl:    calcitRIOL  (ROCALTROL ) 0.25 MCG capsule, Take 0.25 mcg by mouth daily., Disp: , Rfl:    carvedilol  (COREG ) 25 MG tablet, Take 25 mg by mouth 2 (two) times daily with a meal., Disp: , Rfl:    cloNIDine  (CATAPRES ) 0.1 MG tablet, Take 0.1 mg by mouth 2 (two) times daily., Disp: , Rfl:    cyanocobalamin  1000 MCG tablet, Take 1 tablet (1,000 mcg total) by mouth daily., Disp: 30 tablet, Rfl: 2   fluticasone  (FLONASE ) 50 MCG/ACT nasal spray, Place 2 sprays into both nostrils at bedtime., Disp: , Rfl:    folic acid  (FOLVITE ) 1 MG tablet, Take 1 tablet (1 mg total) by mouth daily., Disp: 30 tablet, Rfl: 2   furosemide  (LASIX ) 80 MG tablet, Take 1 tablet (80 mg total)  by mouth daily., Disp: 30 tablet, Rfl: 2   hydrALAZINE  (APRESOLINE ) 25 MG tablet, Take 25 mg by mouth  2 times daily at 12 noon and 4 pm., Disp: , Rfl:    Lancets (ONETOUCH DELICA PLUS LANCET30G) MISC, , Disp: , Rfl:    losartan  (COZAAR ) 100 MG tablet, Take 100 mg by mouth every morning., Disp: , Rfl:    lovastatin (MEVACOR) 20 MG tablet, Take 20 mg by mouth at bedtime., Disp: , Rfl:    NOVOLIN  70/30 (70-30) 100 UNIT/ML injection, Inject 15 Units into the skin 2 (two) times daily with a meal. Reduced from 20 units twice daily., Disp: , Rfl:    Vitamin D , Ergocalciferol , (DRISDOL ) 1.25 MG (50000 UNIT) CAPS capsule, Take 50,000 Units by mouth every 7 (seven) days. Saturdays, Disp: , Rfl:    oxyCODONE -acetaminophen  (PERCOCET) 5-325 MG tablet, Take 1 tablet by mouth every 4 (four) hours as needed for severe pain (pain score 7-10). (Patient not taking: Reported on 01/10/2024), Disp: 20 tablet, Rfl: 0  Physical exam:  Vitals:   01/10/24 1039  BP: 113/68  Pulse: 93  Resp: 17  Temp: (!) 97.2 F (36.2 C)  TempSrc: Tympanic  Weight: 177 lb 1.6 oz (80.3 kg)  Height: 5' 4 (1.626 m)   Physical Exam Cardiovascular:     Rate and Rhythm: Normal rate and regular rhythm.     Heart sounds: Normal heart sounds.  Pulmonary:     Effort: Pulmonary effort is normal.     Breath sounds: Normal breath sounds.  Skin:    General: Skin is warm and dry.  Neurological:     Mental Status: She is alert and oriented to person, place, and time.      I have personally reviewed labs listed below:    Latest Ref Rng & Units 04/20/2023    6:48 AM  CMP  Glucose 70 - 99 mg/dL 682   BUN 8 - 23 mg/dL 50   Creatinine 9.55 - 1.00 mg/dL 2.99   Sodium 864 - 854 mmol/L 133   Potassium 3.5 - 5.1 mmol/L 4.3   Chloride 98 - 111 mmol/L 96       Latest Ref Rng & Units 01/10/2024   10:15 AM  CBC  WBC 4.0 - 10.5 K/uL 6.1   Hemoglobin 12.0 - 15.0 g/dL 8.6   Hematocrit 63.9 - 46.0 % 26.5   Platelets 150 - 400 K/uL 223       Assessment and plan- Patient is a 68 y.o. female here for a routine follow-up of anemia of chronic kidney disease  Assessment and Plan    Anemia due to chronic kidney disease Anemia secondary to chronic kidney disease with hemoglobin at 8.6, no immediate transfusion needed. Avoided erythropoiesis-stimulating agents due to renal cell carcinoma growth risk. Limited transfusions to prevent iron overload. - Continue monthly blood work to monitor hemoglobin levels. - Plan for blood transfusion if hemoglobin drops below 7.  Renal cell carcinoma of the kidney Small renal masses concerning for renal cell carcinoma, stable on CT scan from July 2025. No surgical intervention due to bilateral kidney involvement risk. - Continue annual CT scans to monitor renal masses.         Visit Diagnosis 1. History of anemia due to chronic kidney disease      Dr. Annah Skene, MD, MPH [facility] [phone] 01/10/2024 12:56 PM

## 2024-01-19 ENCOUNTER — Ambulatory Visit (INDEPENDENT_AMBULATORY_CARE_PROVIDER_SITE_OTHER): Admitting: Nurse Practitioner

## 2024-01-19 ENCOUNTER — Other Ambulatory Visit (INDEPENDENT_AMBULATORY_CARE_PROVIDER_SITE_OTHER)

## 2024-01-19 ENCOUNTER — Encounter (INDEPENDENT_AMBULATORY_CARE_PROVIDER_SITE_OTHER): Payer: Self-pay | Admitting: Nurse Practitioner

## 2024-01-19 VITALS — BP 132/71 | HR 92 | Resp 18 | Ht 64.0 in | Wt 173.2 lb

## 2024-01-19 DIAGNOSIS — E1122 Type 2 diabetes mellitus with diabetic chronic kidney disease: Secondary | ICD-10-CM | POA: Diagnosis not present

## 2024-01-19 DIAGNOSIS — N1831 Chronic kidney disease, stage 3a: Secondary | ICD-10-CM

## 2024-01-19 DIAGNOSIS — Z794 Long term (current) use of insulin: Secondary | ICD-10-CM | POA: Diagnosis not present

## 2024-01-19 DIAGNOSIS — N186 End stage renal disease: Secondary | ICD-10-CM

## 2024-01-19 DIAGNOSIS — T82898A Other specified complication of vascular prosthetic devices, implants and grafts, initial encounter: Secondary | ICD-10-CM

## 2024-01-23 ENCOUNTER — Telehealth (INDEPENDENT_AMBULATORY_CARE_PROVIDER_SITE_OTHER): Payer: Self-pay

## 2024-01-23 NOTE — Telephone Encounter (Signed)
 Spoke with the patient and she is scheduled with Dr. Marea for a left arm angio on 01/29/24 with a 6:45 am arrival time to the [facility]. Pre-procedure instructions were discussed and will be sent to Mychart and mailed.

## 2024-01-28 ENCOUNTER — Encounter (INDEPENDENT_AMBULATORY_CARE_PROVIDER_SITE_OTHER): Payer: Self-pay | Admitting: Nurse Practitioner
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
## 2024-01-29 ENCOUNTER — Other Ambulatory Visit: Payer: Self-pay

## 2024-01-29 ENCOUNTER — Encounter: Admission: RE | Payer: Self-pay | Source: Home / Self Care

## 2024-01-29 ENCOUNTER — Ambulatory Visit
Admission: RE | Admit: 2024-01-29 | Discharge: 2024-01-29 | Disposition: A | Discharge status: Home / Self Care | Attending: Vascular Surgery | Admitting: Vascular Surgery

## 2024-01-29 ENCOUNTER — Encounter: Payer: Self-pay | Admitting: Vascular Surgery

## 2024-01-29 DIAGNOSIS — Z794 Long term (current) use of insulin: Secondary | ICD-10-CM | POA: Diagnosis not present

## 2024-01-29 DIAGNOSIS — I120 Hypertensive chronic kidney disease with stage 5 chronic kidney disease or end stage renal disease: Secondary | ICD-10-CM | POA: Diagnosis not present

## 2024-01-29 DIAGNOSIS — T82858A Stenosis of vascular prosthetic devices, implants and grafts, initial encounter: Secondary | ICD-10-CM | POA: Diagnosis not present

## 2024-01-29 DIAGNOSIS — Y832 Surgical operation with anastomosis, bypass or graft as the cause of abnormal reaction of the patient, or of later complication, without mention of misadventure at the time of the procedure: Secondary | ICD-10-CM | POA: Diagnosis not present

## 2024-01-29 DIAGNOSIS — E1122 Type 2 diabetes mellitus with diabetic chronic kidney disease: Secondary | ICD-10-CM | POA: Insufficient documentation

## 2024-01-29 DIAGNOSIS — Z992 Dependence on renal dialysis: Secondary | ICD-10-CM

## 2024-01-29 DIAGNOSIS — K439 Ventral hernia without obstruction or gangrene: Secondary | ICD-10-CM

## 2024-01-29 DIAGNOSIS — I7789 Other specified disorders of arteries and arterioles: Secondary | ICD-10-CM | POA: Diagnosis not present

## 2024-01-29 DIAGNOSIS — T82898A Other specified complication of vascular prosthetic devices, implants and grafts, initial encounter: Secondary | ICD-10-CM | POA: Diagnosis not present

## 2024-01-29 DIAGNOSIS — N186 End stage renal disease: Secondary | ICD-10-CM | POA: Diagnosis not present

## 2024-01-29 HISTORY — PX: UPPER EXTREMITY ANGIOGRAPHY: CATH118270

## 2024-01-29 LAB — GLUCOSE, CAPILLARY: Glucose-Capillary: 119 mg/dL — ABNORMAL HIGH (ref 70–99)

## 2024-01-29 LAB — POTASSIUM (ARMC VASCULAR LAB ONLY): Potassium (ARMC vascular lab): 5.9 mmol/L — ABNORMAL HIGH (ref 3.5–5.1)

## 2024-01-29 SURGERY — UPPER EXTREMITY ANGIOGRAPHY
Anesthesia: Moderate Sedation | Laterality: Left

## 2024-01-29 MED ORDER — MIDAZOLAM HCL 5 MG/5ML IJ SOLN
INTRAMUSCULAR | Status: AC
  Filled 2024-01-29: qty 5

## 2024-01-29 MED ORDER — LIDOCAINE-EPINEPHRINE (PF) 1 %-1:200000 IJ SOLN
INTRAMUSCULAR | Status: DC | PRN
  Administered 2024-01-29: 10 mL

## 2024-01-29 MED ORDER — CEFAZOLIN SODIUM-DEXTROSE 1-4 GM/50ML-% IV SOLN
1.0000 g | INTRAVENOUS | Status: AC
  Administered 2024-01-29: 1 g via INTRAVENOUS

## 2024-01-29 MED ORDER — IODIXANOL 320 MG/ML IV SOLN
INTRAVENOUS | Status: DC | PRN
  Administered 2024-01-29: 40 mL

## 2024-01-29 MED ORDER — FENTANYL CITRATE (PF) 100 MCG/2ML IJ SOLN
INTRAMUSCULAR | Status: AC
  Filled 2024-01-29: qty 2

## 2024-01-29 MED ORDER — HEPARIN (PORCINE) IN NACL 1000-0.9 UT/500ML-% IV SOLN
INTRAVENOUS | Status: DC | PRN
  Administered 2024-01-29: 1000 mL

## 2024-01-29 MED ORDER — HYDROMORPHONE HCL 1 MG/ML IJ SOLN: 1.0000 mg | Freq: Once | INTRAMUSCULAR | Status: DC | PRN

## 2024-01-29 MED ORDER — MIDAZOLAM HCL (PF) 2 MG/2ML IJ SOLN
INTRAMUSCULAR | Status: DC | PRN
  Administered 2024-01-29: 2 mg via INTRAVENOUS

## 2024-01-29 MED ORDER — FENTANYL CITRATE (PF) 100 MCG/2ML IJ SOLN
INTRAMUSCULAR | Status: DC | PRN
  Administered 2024-01-29: 50 ug via INTRAVENOUS

## 2024-01-29 MED ORDER — ONDANSETRON HCL 4 MG/2ML IJ SOLN: 4.0000 mg | Freq: Four times a day (QID) | INTRAMUSCULAR | Status: DC | PRN

## 2024-01-29 MED ORDER — HEPARIN SODIUM (PORCINE) 1000 UNIT/ML IJ SOLN
INTRAMUSCULAR | Status: DC | PRN
  Administered 2024-01-29: 4000 [IU] via INTRAVENOUS

## 2024-01-29 MED ORDER — HEPARIN SODIUM (PORCINE) 1000 UNIT/ML IJ SOLN
INTRAMUSCULAR | Status: AC
  Filled 2024-01-29: qty 10

## 2024-01-29 MED ORDER — METHYLPREDNISOLONE SODIUM SUCC 125 MG IJ SOLR: 125.0000 mg | Freq: Once | INTRAMUSCULAR | Status: DC | PRN

## 2024-01-29 MED ORDER — CEFAZOLIN SODIUM-DEXTROSE 1-4 GM/50ML-% IV SOLN
INTRAVENOUS | Status: AC
  Filled 2024-01-29: qty 50

## 2024-01-29 SURGICAL SUPPLY — 11 items
CATH ANGIO 5F PIGTAIL 100CM (CATHETERS) IMPLANT
CATH BEACON 5 .035 100 H1 TIP (CATHETERS) IMPLANT
CATH CXI SUPP ANG 4FR 135 (CATHETERS) IMPLANT
COVER PROBE ULTRASOUND 5X96 (MISCELLANEOUS) IMPLANT
DEVICE STARCLOSE SE CLOSURE (Vascular Products) IMPLANT
GLIDEWIRE ANGLED SS 035X260CM (WIRE) IMPLANT
PACK ANGIOGRAPHY (CUSTOM PROCEDURE TRAY) IMPLANT
SHEATH BRITE TIP 5FRX11 (SHEATH) IMPLANT
SYR MEDRAD MARK 7 150ML (SYRINGE) IMPLANT
TUBING CONTRAST HIGH PRESS 72 (TUBING) IMPLANT
WIRE J 3MM .035X145CM (WIRE) IMPLANT

## 2024-01-29 NOTE — Op Note (Signed)
 OPERATIVE REPORT     PREOPERATIVE DIAGNOSIS: 1. End-stage renal disease. 2. Steal syndrome, left arm with patent left brachiocephalic.   POSTOPERATIVE DIAGNOSIS: Same as above   PROCEDURE PERFORMED: 1. Ultrasound guidance vascular access to right femoral artery. 2. Catheter placement to left radial artery and left ulnar arteries     from right femoral approach. 3. Thoracic aortogram and selective left upper extremity angiogram     including selective images of the radial and ulnar arteries. 4. StarClose closure device right femoral artery.   SURGEON:  Selinda GORMAN Gu, MD   ANESTHESIA:  Local with moderate conscious sedation for 13 minutes using 2 mg of Versed  and 50 mcg of Fentanyl    BLOOD LOSS:  Minimal.   FLUOROSCOPY TIME: 4.6 minutes   INDICATION FOR PROCEDURE:  This is a 68 y.o.female who presented to our office with steal syndrome.  The patient's left brachiocephalic AV fistula is working well, but their hand is numb and painful.  To further evaluate this to determine what options would be possible to treat the steal syndrome, angiogram of the left upper extremity is indicated.  Risks and benefits are discussed.  Informed consent was obtained.   DESCRIPTION OF PROCEDURE:  The patient was brought to the vascular suite.  Moderate conscious sedation was administered during a face to face encounter with the patient throughout the procedure with my supervision of the RN administering medicines and monitoring the patient's vital signs, pulse oximetry, telemetry and mental status throughout from the start of the procedure until the patient was taken to the recovery room.  Groins were shaved and prepped and sterile surgical field was created.  The right femoral head was localized with fluoroscopy and the right femoral artery was then visualized with ultrasound and found to be widely patent.  It was then accessed under direct ultrasound guidance without difficulty with a Seldinger  needle and a permanent image was recorded.  A J-wire and 5-French sheath were then placed.  Pigtail catheter was placed into the ascending aorta and a thoracic aortogram was then performed in the LAO projection. This demonstrated normal origins to the great vessels without significant proximal stenoses and a normal configuration of the great vessels.  The patient was given 4000 units of intravenous heparin  and a H1 catheter was used to selectively cannulate the left subclavian artery without difficulty.  This was then sequentially advanced to the brachial artery and to the brachial bifurcation.  Steal was demonstrated with most of the flow from the artery going into the fistula and not downstream to the hand on images with the catheter proximal to the access. I then advanced a catheter beyond the access and into the radial and ulnar arteries.  The radial artery was entered first, but this required exchanging for CXI catheter for length to get down across the AV fistula anastomosis and into the proximal left radial artery.  After imaging the radial artery, the catheter was pulled back and then used to cannulate the left ulnar artery.  Selective imaging was performed in both vessels.  Findings in the left upper extremity showed no significant stenosis in the left subclavian artery, axillary artery, or brachial artery.  The left radial artery was widely patent and did fill the first 3 digits in the hand reasonably well but the palmar arch was poor and it did not fill the 4th and 5th fingers particularly well.  The left ulnar artery was tiny and it occluded at the wrist and did not fill  the hand and there is no distal reconstitution to attempt revascularization.   The diagnostic catheter was removed. StarClose closure device deployed in the usual fashion with excellent hemostatic result. The patient tolerated the procedure well and was taken to the recovery room in stable condition.    Selinda Gu 01/29/2024 9:07 AM

## 2024-01-29 NOTE — Interval H&P Note (Signed)
 History and Physical Interval Note:  01/29/2024 8:03 AM  Heidi [name]  has presented today for surgery, with the diagnosis of L Arm Angiogram   End Stage Renal.  The various methods of treatment have been discussed with the patient and family. After consideration of risks, benefits and other options for treatment, the patient has consented to  Procedures: Upper Extremity Angiography (Left) as a surgical intervention.  The patient's history has been reviewed, patient examined, no change in status, stable for surgery.  I have reviewed the patient's chart and labs.  Questions were answered to the patient's satisfaction.     Jordane Hisle

## 2024-02-12 ENCOUNTER — Inpatient Hospital Stay: Attending: Internal Medicine

## 2024-02-12 DIAGNOSIS — D631 Anemia in chronic kidney disease: Secondary | ICD-10-CM | POA: Diagnosis not present

## 2024-02-12 DIAGNOSIS — Z79899 Other long term (current) drug therapy: Secondary | ICD-10-CM | POA: Insufficient documentation

## 2024-02-12 DIAGNOSIS — I132 Hypertensive heart and chronic kidney disease with heart failure and with stage 5 chronic kidney disease, or end stage renal disease: Secondary | ICD-10-CM | POA: Diagnosis present

## 2024-02-12 DIAGNOSIS — N189 Chronic kidney disease, unspecified: Secondary | ICD-10-CM

## 2024-02-12 DIAGNOSIS — N186 End stage renal disease: Secondary | ICD-10-CM | POA: Diagnosis not present

## 2024-02-12 LAB — SAMPLE TO BLOOD BANK

## 2024-02-12 LAB — HEMOGLOBIN AND HEMATOCRIT (CANCER CENTER ONLY)
HCT: 25.1 % — ABNORMAL LOW (ref 36.0–46.0)
Hemoglobin: 8.1 g/dL — ABNORMAL LOW (ref 12.0–15.0)

## 2024-02-13 ENCOUNTER — Telehealth: Payer: Self-pay

## 2024-02-13 NOTE — Telephone Encounter (Signed)
 Outbound call to patient, informed of below.  No further questions / concerns at this time.

## 2024-02-13 NOTE — Telephone Encounter (Signed)
 02/12/24 hgb 8.1, hct 25.1, please advise regarding possible blood transfusion tomorrow; per Dr. Melanee no blood.  Outbound call, left voice message indicating no need to come to cancer tomorrow as previously planned.  Will try again shortly.

## 2024-02-14 ENCOUNTER — Inpatient Hospital Stay

## 2024-02-19 ENCOUNTER — Encounter (INDEPENDENT_AMBULATORY_CARE_PROVIDER_SITE_OTHER): Payer: Self-pay | Admitting: Nurse Practitioner

## 2024-02-19 ENCOUNTER — Ambulatory Visit (INDEPENDENT_AMBULATORY_CARE_PROVIDER_SITE_OTHER): Admitting: Nurse Practitioner

## 2024-02-19 VITALS — BP 134/70 | HR 94 | Resp 18 | Ht 64.0 in | Wt 178.4 lb

## 2024-02-19 DIAGNOSIS — I10 Essential (primary) hypertension: Secondary | ICD-10-CM | POA: Diagnosis not present

## 2024-02-19 DIAGNOSIS — E1122 Type 2 diabetes mellitus with diabetic chronic kidney disease: Secondary | ICD-10-CM

## 2024-02-19 DIAGNOSIS — T82898A Other specified complication of vascular prosthetic devices, implants and grafts, initial encounter: Secondary | ICD-10-CM

## 2024-02-19 DIAGNOSIS — N1831 Chronic kidney disease, stage 3a: Secondary | ICD-10-CM | POA: Diagnosis not present

## 2024-02-19 DIAGNOSIS — Z794 Long term (current) use of insulin: Secondary | ICD-10-CM

## 2024-02-19 NOTE — H&P (View-Only) (Signed)
 "  Subjective:    Patient ID: Heidi [name], female    DOB: [birth year], 69 y.o.   MRN: [id number] Chief Complaint  Patient presents with   Follow-up    3 week follow up     HPI  Discussed the use of AI scribe software for clinical note transcription with the patient, who gave verbal consent to proceed.  History of Present Illness Heidi [name] is a 69 year old female with end stage renal disease and dialysis access steal syndrome who presents with persistent hand pain and new blistering on her fingers.  She reports persistent pain involving the entire hand, radiating down the arm, which is present at all times and not limited to dialysis sessions. She expresses frustration with the lack of relief from these symptoms.  She recently underwent a vascular procedure involving banding or ligation of a vessel in the hand. She was informed that one artery is functioning adequately while the other is not. She continues to receive hemodialysis via the affected arm three times weekly.  She has developed new, painless blistering on the fingers of both hands, with right hand appearing slightly worse. She was unaware of the blisters until recently and denies exposure to irritants, new products, or burns.She did not mention the blisters during her recent primary care visit, as she had not noticed them at that time.    Results     Review of Systems  Skin:  Positive for rash.  Neurological:  Positive for numbness.  All other systems reviewed and are negative.      Objective:   Physical Exam Vitals reviewed.  HENT:     Head: Normocephalic.  Cardiovascular:     Rate and Rhythm: Normal rate.     Pulses:          Radial pulses are 1+ on the right side and 1+ on the left side.  Pulmonary:     Effort: Pulmonary effort is normal.  Skin:    General: Skin is warm and dry.  Neurological:     Mental Status: She is alert and oriented to person, place, and time.  Psychiatric:        Mood and  Affect: Mood normal.        Behavior: Behavior normal.        Thought Content: Thought content normal.        Judgment: Judgment normal.     Physical Exam    BP 134/70 (BP Location: Right Arm, Patient Position: Sitting, Cuff Size: Normal)   Pulse 94   Resp 18   Ht 5' 4 (1.626 m)   Wt 178 lb 6.4 oz (80.9 kg)   BMI 30.62 kg/m   Past Medical History:  Diagnosis Date   Allergic rhinitis    Anemia of chronic kidney failure, stage 5 (HCC)    Arthritis    sacroiliac joint   Asthma    Breast mass in female 09/05/2014   Degenerative arthritis of lumbar spine    Diabetes mellitus without complication (HCC)    Dialysis patient    ESRD (end stage renal disease) (HCC)    Genu valgus, congenital    GERD (gastroesophageal reflux disease)    Heart murmur    Hyperlipidemia    Hypertension    Hyponatremia    Microalbuminuria    Morbid obesity (HCC)    Pericardial effusion 01/25/2023   Peritoneal dialysis catheter in situ    Pes planus    Pneumonia  as a child   Red blood cell antibody positive    Renal disorder    CKD stage 3   Renal insufficiency    Secondary hyperparathyroidism of renal origin    Sepsis due to gram-negative UTI (HCC)    Sleep apnea    does not use cpap   Tricompartment degenerative joint disease of knee    Trochanteric bursitis of both hips    Vitamin D  insufficiency    Volvulus (HCC)     Social History   Socioeconomic History   Marital status: Single    Spouse name: Not on file   Number of children: Not on file   Years of education: Not on file   Highest education level: Not on file  Occupational History   Not on file  Tobacco Use   Smoking status: Never    Passive exposure: Never   Smokeless tobacco: Never  Vaping Use   Vaping status: Never Used  Substance and Sexual Activity   Alcohol use: Yes    Comment: rarely   Drug use: No   Sexual activity: Not on file  Other Topics Concern   Not on file  Social History Narrative   Lives at  home with son   Social Drivers of Health   Tobacco Use: Low Risk (02/19/2024)   Patient History    Smoking Tobacco Use: Never    Smokeless Tobacco Use: Never    Passive Exposure: Never  Financial Resource Strain: Low Risk  (10/12/2023)   Received from [facility]   Overall Financial Resource Strain (CARDIA)    Difficulty of Paying Living Expenses: Not hard at all  Food Insecurity: No Food Insecurity (10/12/2023)   Received from [facility]   Epic    Within the past 12 months, you worried that your food would run out before you got the money to buy more.: Never true    Within the past 12 months, the food you bought just didn't last and you didn't have money to get more.: Never true  Transportation Needs: No Transportation Needs (10/12/2023)   Received from [facility]   PRAPARE - Transportation    Lack of Transportation (Non-Medical): No    In the past 12 months, has lack of transportation kept you from medical appointments or from getting medications?: No  Physical Activity: Inactive (03/10/2021)   Received from [facility]   Exercise Vital Sign    On average, how many days per week do you engage in moderate to strenuous exercise (like a brisk walk)?: 0 days    On average, how many minutes do you engage in exercise at this level?: 0 min  Stress: No Stress Concern Present (03/10/2021)   Received from [facility] - Occupational Stress Questionnaire    Feeling of Stress : Only a little  Social Connections: Not on file  Intimate Partner Violence: Not At Risk (01/24/2023)   Humiliation, Afraid, Rape, and Kick questionnaire    Fear of Current or Ex-Partner: No    Emotionally Abused: No    Physically Abused: No    Sexually Abused: No  Depression (PHQ2-9): Low Risk (01/10/2024)   Depression (PHQ2-9)    PHQ-2 Score: 0  Alcohol Screen: Not on file  Housing: Unknown  (10/12/2023)   Received from [facility]   Epic    At any time in the past 12 months, were you homeless  or living in a shelter (including now)?: No    In the past 12 months, how many times have you moved where you were living?: 0    In the last 12 months, was there a time when you were not able to pay the mortgage or rent on time?: Patient declined  Utilities: Not At Risk (10/12/2023)   Received [facility]   Epic    In the past 12 months has the electric, gas, oil, or water company threatened to shut off services in your home?: No  Health Literacy: Not on file    Past Surgical History:  Procedure Laterality Date   ABDOMINAL HYSTERECTOMY     AV FISTULA PLACEMENT Left 04/20/2023   Procedure: ARTERIOVENOUS (AV) FISTULA CREATION;  Surgeon: Marea Selinda RAMAN, MD;  Location: [facility];  Service: Vascular;  Laterality: Left;   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   BREAST BIOPSY Right 1996   benign   BREAST BIOPSY Right 07/13/2022   MM RT BREAST BX W LOC DEV 1ST LESION IMAGE BX SPEC STEREO GUIDE 07/13/2022 [facility]   BREAST BIOPSY Right 02/27/2023   MM RT BREAST BX W LOC DEV 1ST LESION IMAGE BX SPEC STEREO GUIDE 02/27/2023 [facility]   BREAST BIOPSY Right 02/27/2023   MM RT BREAST BX W LOC DEV EA AD LESION IMG BX SPEC STEREO GUIDE 02/27/2023 [facility]   BREAST EXCISIONAL BIOPSY Right 2016   broken ankle surgery Right 02/2021   CAPD INSERTION N/A 10/07/2021   Procedure: LAPAROSCOPIC INSERTION CONTINUOUS AMBULATORY PERITONEAL DIALYSIS  (CAPD) CATHETER;  Surgeon: Jordis Laneta FALCON, MD;  Location: [facility];  Service: General;  Laterality: N/A;   CAPD REMOVAL N/A 03/31/2023   Procedure: OPEN REMOVAL CONTINUOUS AMBULATORY PERITONEAL DIALYSIS  (CAPD) CATHETER;  Surgeon: Jordis Laneta FALCON, MD;  Location: [facility];  Service: General;  Laterality: N/A;   COLONOSCOPY     COLONOSCOPY N/A 12/19/2019   Procedure: COLONOSCOPY;  Surgeon: Toledo, Ladell POUR, MD;   Location: [facility];  Service: Gastroenterology;  Laterality: N/A;   DIALYSIS/PERMA CATHETER INSERTION N/A 01/30/2023   Procedure: DIALYSIS/PERMA CATHETER INSERTION;  Surgeon: Marea Selinda RAMAN, MD;  Location: [facility];  Service: Cardiovascular;  Laterality: N/A;   EXPLORATORY LAPAROTOMY     of colon for torsion   INSERTION OF MESH  10/07/2021   Procedure: INSERTION OF MESH;  Surgeon: Jordis Laneta FALCON, MD;  Location: [facility];  Service: General;;   NASAL FRACTURE SURGERY     UMBILICAL HERNIA REPAIR N/A 10/07/2021   Procedure: HERNIA REPAIR UMBILICAL ADULT;  Surgeon: Jordis Laneta FALCON, MD;  Location: [facility];  Service: General;  Laterality: N/A;   UPPER EXTREMITY ANGIOGRAPHY Left 01/29/2024   Procedure: Upper Extremity Angiography;  Surgeon: Marea Selinda RAMAN, MD;  Location: [facility];  Service: Cardiovascular;  Laterality: Left;    Family History  Problem Relation Age of Onset   Diabetes Mother    Diabetes Father    Cancer Father    Breast cancer Maternal Aunt     Allergies[1]     Latest Ref Rng & Units 02/12/2024   10:03 AM 01/10/2024   10:15 AM 12/11/2023    9:58 AM  CBC  WBC 4.0 - 10.5 K/uL  6.1    Hemoglobin 12.0 - 15.0 g/dL 8.1  8.6  8.3   Hematocrit 36.0 - 46.0 % 25.1  26.5  26.1   Platelets 150 - 400 K/uL  223  CMP     Component Value Date/Time   NA 133 (L) 04/20/2023 0648   K 4.3 04/20/2023 0648   CL 96 (L) 04/20/2023 0648   CO2 27 03/31/2023 0746   GLUCOSE 317 (H) 04/20/2023 0648   BUN 50 (H) 04/20/2023 0648   CREATININE 7.00 (H) 04/20/2023 0648   CALCIUM  9.6 03/31/2023 0746   PROT 5.3 (L) 01/24/2023 0445   ALBUMIN  1.9 (L) 01/24/2023 0445   AST 16 01/24/2023 0445   ALT 25 01/24/2023 0445   ALKPHOS 61 01/24/2023 0445   BILITOT 0.3 01/24/2023 0445   GFRNONAA 5 (L) 03/31/2023 0746     No results found.     Assessment & Plan:   1. Steal syndrome as complication of dialysis access, initial encounter (Primary) Steal syndrome as  complication of dialysis access Chronic steal syndrome due to inadequate arterial perfusion to the hand from dialysis access, causing significant pain and impaired quality of life. Insufficient flow from one artery with inadequate compensation from the other. - Referred for banding procedure to improve distal perfusion. - Explained banding as outpatient with same-day discharge. - Discussed successful banding should allow continued use of current access without catheter. - Reviewed contingency: if pain persists post-banding, ligation and new access in contralateral arm may be needed, with possible temporary catheter. - Discussed risks including post-procedural soreness or irritation.  2. Essential hypertension Continue antihypertensive medications as already ordered, these medications have been reviewed and there are no changes at this time.  3. Type 2 diabetes mellitus with stage 3a chronic kidney disease, with long-term current use of insulin  (HCC) Continue hypoglycemic medications as already ordered, these medications have been reviewed and there are no changes at this time.  Hgb A1C to be monitored as already arranged by primary service   Assessment and Plan Assessment & Plan      Medications Ordered Prior to Encounter[2]  There are no Patient Instructions on file for this visit. No follow-ups on file.   Kiylah Loyer E Allice Garro, NP      [1]  Allergies Allergen Reactions   Sulfa Antibiotics Nausea And Vomiting  [2]  Current Outpatient Medications on File Prior to Visit  Medication Sig Dispense Refill   albuterol  (VENTOLIN  HFA) 108 (90 Base) MCG/ACT inhaler Inhale 2 puffs into the lungs every 4 (four) hours as needed for wheezing or shortness of breath.     amLODipine  (NORVASC ) 10 MG tablet Take 10 mg by mouth every morning.     calcitRIOL  (ROCALTROL ) 0.25 MCG capsule Take 0.25 mcg by mouth daily.     carvedilol  (COREG ) 25 MG tablet Take 25 mg by mouth 2 (two) times daily with a  meal.     cloNIDine  (CATAPRES ) 0.1 MG tablet Take 0.1 mg by mouth 2 (two) times daily.     cyanocobalamin  1000 MCG tablet Take 1 tablet (1,000 mcg total) by mouth daily. 30 tablet 2   fluticasone  (FLONASE ) 50 MCG/ACT nasal spray Place 2 sprays into both nostrils at bedtime.     folic acid  (FOLVITE ) 1 MG tablet Take 1 tablet (1 mg total) by mouth daily. 30 tablet 2   furosemide  (LASIX ) 80 MG tablet Take 1 tablet (80 mg total) by mouth daily. 30 tablet 2   hydrALAZINE  (APRESOLINE ) 25 MG tablet Take 25 mg by mouth 2 times daily at 12 noon and 4 pm.     Lancets (ONETOUCH DELICA PLUS LANCET30G) MISC      losartan  (COZAAR ) 100 MG tablet Take 100 mg by mouth  every morning.     lovastatin (MEVACOR) 20 MG tablet Take 20 mg by mouth at bedtime.     multivitamin (RENA-VIT) TABS tablet Take 1 tablet by mouth daily.     NOVOLIN  70/30 (70-30) 100 UNIT/ML injection Inject 15 Units into the skin 2 (two) times daily with a meal. Reduced from 20 units twice daily.     Vitamin D , Ergocalciferol , (DRISDOL ) 1.25 MG (50000 UNIT) CAPS capsule Take 50,000 Units by mouth every 7 (seven) days. Saturdays     oxyCODONE -acetaminophen  (PERCOCET) 5-325 MG tablet Take 1 tablet by mouth every 4 (four) hours as needed for severe pain (pain score 7-10). (Patient not taking: Reported on 02/19/2024) 20 tablet 0   No current facility-administered medications on file prior to visit.   "

## 2024-02-19 NOTE — Progress Notes (Signed)
 "  Subjective:    Patient ID: Heidi [name], female    DOB: [birth year], 69 y.o.   MRN: [id number] Chief Complaint  Patient presents with   Follow-up    3 week follow up     HPI  Discussed the use of AI scribe software for clinical note transcription with the patient, who gave verbal consent to proceed.  History of Present Illness Heidi [name] is a 69 year old female with end stage renal disease and dialysis access steal syndrome who presents with persistent hand pain and new blistering on her fingers.  She reports persistent pain involving the entire hand, radiating down the arm, which is present at all times and not limited to dialysis sessions. She expresses frustration with the lack of relief from these symptoms.  She recently underwent a vascular procedure involving banding or ligation of a vessel in the hand. She was informed that one artery is functioning adequately while the other is not. She continues to receive hemodialysis via the affected arm three times weekly.  She has developed new, painless blistering on the fingers of both hands, with right hand appearing slightly worse. She was unaware of the blisters until recently and denies exposure to irritants, new products, or burns.She did not mention the blisters during her recent primary care visit, as she had not noticed them at that time.    Results     Review of Systems  Skin:  Positive for rash.  Neurological:  Positive for numbness.  All other systems reviewed and are negative.      Objective:   Physical Exam Vitals reviewed.  HENT:     Head: Normocephalic.  Cardiovascular:     Rate and Rhythm: Normal rate.     Pulses:          Radial pulses are 1+ on the right side and 1+ on the left side.  Pulmonary:     Effort: Pulmonary effort is normal.  Skin:    General: Skin is warm and dry.  Neurological:     Mental Status: She is alert and oriented to person, place, and time.  Psychiatric:        Mood and  Affect: Mood normal.        Behavior: Behavior normal.        Thought Content: Thought content normal.        Judgment: Judgment normal.     Physical Exam    BP 134/70 (BP Location: Right Arm, Patient Position: Sitting, Cuff Size: Normal)   Pulse 94   Resp 18   Ht 5' 4 (1.626 m)   Wt 178 lb 6.4 oz (80.9 kg)   BMI 30.62 kg/m   Past Medical History:  Diagnosis Date   Allergic rhinitis    Anemia of chronic kidney failure, stage 5 (HCC)    Arthritis    sacroiliac joint   Asthma    Breast mass in female 09/05/2014   Degenerative arthritis of lumbar spine    Diabetes mellitus without complication (HCC)    Dialysis patient    ESRD (end stage renal disease) (HCC)    Genu valgus, congenital    GERD (gastroesophageal reflux disease)    Heart murmur    Hyperlipidemia    Hypertension    Hyponatremia    Microalbuminuria    Morbid obesity (HCC)    Pericardial effusion 01/25/2023   Peritoneal dialysis catheter in situ    Pes planus    Pneumonia  as a child   Red blood cell antibody positive    Renal disorder    CKD stage 3   Renal insufficiency    Secondary hyperparathyroidism of renal origin    Sepsis due to gram-negative UTI (HCC)    Sleep apnea    does not use cpap   Tricompartment degenerative joint disease of knee    Trochanteric bursitis of both hips    Vitamin D  insufficiency    Volvulus (HCC)     Social History   Socioeconomic History   Marital status: Single    Spouse name: Not on file   Number of children: Not on file   Years of education: Not on file   Highest education level: Not on file  Occupational History   Not on file  Tobacco Use   Smoking status: Never    Passive exposure: Never   Smokeless tobacco: Never  Vaping Use   Vaping status: Never Used  Substance and Sexual Activity   Alcohol use: Yes    Comment: rarely   Drug use: No   Sexual activity: Not on file  Other Topics Concern   Not on file  Social History Narrative   Lives at  home with son   Social Drivers of Health   Tobacco Use: Low Risk (02/19/2024)   Patient History    Smoking Tobacco Use: Never    Smokeless Tobacco Use: Never    Passive Exposure: Never  Financial Resource Strain: Low Risk  (10/12/2023)   Received from [facility]   Overall Financial Resource Strain (CARDIA)    Difficulty of Paying Living Expenses: Not hard at all  Food Insecurity: No Food Insecurity (10/12/2023)   Received from [facility]   Epic    Within the past 12 months, you worried that your food would run out before you got the money to buy more.: Never true    Within the past 12 months, the food you bought just didn't last and you didn't have money to get more.: Never true  Transportation Needs: No Transportation Needs (10/12/2023)   Received from [facility]   PRAPARE - Transportation    Lack of Transportation (Non-Medical): No    In the past 12 months, has lack of transportation kept you from medical appointments or from getting medications?: No  Physical Activity: Inactive (03/10/2021)   Received from [facility]   Exercise Vital Sign    On average, how many days per week do you engage in moderate to strenuous exercise (like a brisk walk)?: 0 days    On average, how many minutes do you engage in exercise at this level?: 0 min  Stress: No Stress Concern Present (03/10/2021)   Received from [facility] - Occupational Stress Questionnaire    Feeling of Stress : Only a little  Social Connections: Not on file  Intimate Partner Violence: Not At Risk (01/24/2023)   Humiliation, Afraid, Rape, and Kick questionnaire    Fear of Current or Ex-Partner: No    Emotionally Abused: No    Physically Abused: No    Sexually Abused: No  Depression (PHQ2-9): Low Risk (01/10/2024)   Depression (PHQ2-9)    PHQ-2 Score: 0  Alcohol Screen: Not on file  Housing: Unknown  (10/12/2023)   Received from [facility]   Epic    At any time in the past 12 months, were you homeless  or living in a shelter (including now)?: No    In the past 12 months, how many times have you moved where you were living?: 0    In the last 12 months, was there a time when you were not able to pay the mortgage or rent on time?: Patient declined  Utilities: Not At Risk (10/12/2023)   Received [facility]   Epic    In the past 12 months has the electric, gas, oil, or water company threatened to shut off services in your home?: No  Health Literacy: Not on file    Past Surgical History:  Procedure Laterality Date   ABDOMINAL HYSTERECTOMY     AV FISTULA PLACEMENT Left 04/20/2023   Procedure: ARTERIOVENOUS (AV) FISTULA CREATION;  Surgeon: Marea Selinda RAMAN, MD;  Location: [facility];  Service: Vascular;  Laterality: Left;   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   BREAST BIOPSY Right 1996   benign   BREAST BIOPSY Right 07/13/2022   MM RT BREAST BX W LOC DEV 1ST LESION IMAGE BX SPEC STEREO GUIDE 07/13/2022 [facility]   BREAST BIOPSY Right 02/27/2023   MM RT BREAST BX W LOC DEV 1ST LESION IMAGE BX SPEC STEREO GUIDE 02/27/2023 [facility]   BREAST BIOPSY Right 02/27/2023   MM RT BREAST BX W LOC DEV EA AD LESION IMG BX SPEC STEREO GUIDE 02/27/2023 [facility]   BREAST EXCISIONAL BIOPSY Right 2016   broken ankle surgery Right 02/2021   CAPD INSERTION N/A 10/07/2021   Procedure: LAPAROSCOPIC INSERTION CONTINUOUS AMBULATORY PERITONEAL DIALYSIS  (CAPD) CATHETER;  Surgeon: Jordis Laneta FALCON, MD;  Location: [facility];  Service: General;  Laterality: N/A;   CAPD REMOVAL N/A 03/31/2023   Procedure: OPEN REMOVAL CONTINUOUS AMBULATORY PERITONEAL DIALYSIS  (CAPD) CATHETER;  Surgeon: Jordis Laneta FALCON, MD;  Location: [facility];  Service: General;  Laterality: N/A;   COLONOSCOPY     COLONOSCOPY N/A 12/19/2019   Procedure: COLONOSCOPY;  Surgeon: Toledo, Ladell POUR, MD;   Location: [facility];  Service: Gastroenterology;  Laterality: N/A;   DIALYSIS/PERMA CATHETER INSERTION N/A 01/30/2023   Procedure: DIALYSIS/PERMA CATHETER INSERTION;  Surgeon: Marea Selinda RAMAN, MD;  Location: [facility];  Service: Cardiovascular;  Laterality: N/A;   EXPLORATORY LAPAROTOMY     of colon for torsion   INSERTION OF MESH  10/07/2021   Procedure: INSERTION OF MESH;  Surgeon: Jordis Laneta FALCON, MD;  Location: [facility];  Service: General;;   NASAL FRACTURE SURGERY     UMBILICAL HERNIA REPAIR N/A 10/07/2021   Procedure: HERNIA REPAIR UMBILICAL ADULT;  Surgeon: Jordis Laneta FALCON, MD;  Location: [facility];  Service: General;  Laterality: N/A;   UPPER EXTREMITY ANGIOGRAPHY Left 01/29/2024   Procedure: Upper Extremity Angiography;  Surgeon: Marea Selinda RAMAN, MD;  Location: [facility];  Service: Cardiovascular;  Laterality: Left;    Family History  Problem Relation Age of Onset   Diabetes Mother    Diabetes Father    Cancer Father    Breast cancer Maternal Aunt     Allergies[1]     Latest Ref Rng & Units 02/12/2024   10:03 AM 01/10/2024   10:15 AM 12/11/2023    9:58 AM  CBC  WBC 4.0 - 10.5 K/uL  6.1    Hemoglobin 12.0 - 15.0 g/dL 8.1  8.6  8.3   Hematocrit 36.0 - 46.0 % 25.1  26.5  26.1   Platelets 150 - 400 K/uL  223  CMP     Component Value Date/Time   NA 133 (L) 04/20/2023 0648   K 4.3 04/20/2023 0648   CL 96 (L) 04/20/2023 0648   CO2 27 03/31/2023 0746   GLUCOSE 317 (H) 04/20/2023 0648   BUN 50 (H) 04/20/2023 0648   CREATININE 7.00 (H) 04/20/2023 0648   CALCIUM  9.6 03/31/2023 0746   PROT 5.3 (L) 01/24/2023 0445   ALBUMIN  1.9 (L) 01/24/2023 0445   AST 16 01/24/2023 0445   ALT 25 01/24/2023 0445   ALKPHOS 61 01/24/2023 0445   BILITOT 0.3 01/24/2023 0445   GFRNONAA 5 (L) 03/31/2023 0746     No results found.     Assessment & Plan:   1. Steal syndrome as complication of dialysis access, initial encounter (Primary) Steal syndrome as  complication of dialysis access Chronic steal syndrome due to inadequate arterial perfusion to the hand from dialysis access, causing significant pain and impaired quality of life. Insufficient flow from one artery with inadequate compensation from the other. - Referred for banding procedure to improve distal perfusion. - Explained banding as outpatient with same-day discharge. - Discussed successful banding should allow continued use of current access without catheter. - Reviewed contingency: if pain persists post-banding, ligation and new access in contralateral arm may be needed, with possible temporary catheter. - Discussed risks including post-procedural soreness or irritation.  2. Essential hypertension Continue antihypertensive medications as already ordered, these medications have been reviewed and there are no changes at this time.  3. Type 2 diabetes mellitus with stage 3a chronic kidney disease, with long-term current use of insulin  (HCC) Continue hypoglycemic medications as already ordered, these medications have been reviewed and there are no changes at this time.  Hgb A1C to be monitored as already arranged by primary service   Assessment and Plan Assessment & Plan      Medications Ordered Prior to Encounter[2]  There are no Patient Instructions on file for this visit. No follow-ups on file.   Kiylah Loyer E Allice Garro, NP      [1]  Allergies Allergen Reactions   Sulfa Antibiotics Nausea And Vomiting  [2]  Current Outpatient Medications on File Prior to Visit  Medication Sig Dispense Refill   albuterol  (VENTOLIN  HFA) 108 (90 Base) MCG/ACT inhaler Inhale 2 puffs into the lungs every 4 (four) hours as needed for wheezing or shortness of breath.     amLODipine  (NORVASC ) 10 MG tablet Take 10 mg by mouth every morning.     calcitRIOL  (ROCALTROL ) 0.25 MCG capsule Take 0.25 mcg by mouth daily.     carvedilol  (COREG ) 25 MG tablet Take 25 mg by mouth 2 (two) times daily with a  meal.     cloNIDine  (CATAPRES ) 0.1 MG tablet Take 0.1 mg by mouth 2 (two) times daily.     cyanocobalamin  1000 MCG tablet Take 1 tablet (1,000 mcg total) by mouth daily. 30 tablet 2   fluticasone  (FLONASE ) 50 MCG/ACT nasal spray Place 2 sprays into both nostrils at bedtime.     folic acid  (FOLVITE ) 1 MG tablet Take 1 tablet (1 mg total) by mouth daily. 30 tablet 2   furosemide  (LASIX ) 80 MG tablet Take 1 tablet (80 mg total) by mouth daily. 30 tablet 2   hydrALAZINE  (APRESOLINE ) 25 MG tablet Take 25 mg by mouth 2 times daily at 12 noon and 4 pm.     Lancets (ONETOUCH DELICA PLUS LANCET30G) MISC      losartan  (COZAAR ) 100 MG tablet Take 100 mg by mouth  every morning.     lovastatin (MEVACOR) 20 MG tablet Take 20 mg by mouth at bedtime.     multivitamin (RENA-VIT) TABS tablet Take 1 tablet by mouth daily.     NOVOLIN  70/30 (70-30) 100 UNIT/ML injection Inject 15 Units into the skin 2 (two) times daily with a meal. Reduced from 20 units twice daily.     Vitamin D , Ergocalciferol , (DRISDOL ) 1.25 MG (50000 UNIT) CAPS capsule Take 50,000 Units by mouth every 7 (seven) days. Saturdays     oxyCODONE -acetaminophen  (PERCOCET) 5-325 MG tablet Take 1 tablet by mouth every 4 (four) hours as needed for severe pain (pain score 7-10). (Patient not taking: Reported on 02/19/2024) 20 tablet 0   No current facility-administered medications on file prior to visit.   "

## 2024-02-21 ENCOUNTER — Telehealth (INDEPENDENT_AMBULATORY_CARE_PROVIDER_SITE_OTHER): Payer: Self-pay

## 2024-02-21 NOTE — Telephone Encounter (Signed)
 Spoke with the patient and she is scheduled with Dr. Marea for a LUE fistula banding on 03/06/24 at the [facility]. Pre-op  appt on 02/28/24 at 9:00 am at the [facility]. Pre-surgical instructions were discussed and will be sent to Mychart and mailed.

## 2024-02-27 ENCOUNTER — Other Ambulatory Visit (INDEPENDENT_AMBULATORY_CARE_PROVIDER_SITE_OTHER): Payer: Self-pay | Admitting: Nurse Practitioner

## 2024-02-27 DIAGNOSIS — T82898A Other specified complication of vascular prosthetic devices, implants and grafts, initial encounter: Secondary | ICD-10-CM

## 2024-02-28 ENCOUNTER — Other Ambulatory Visit: Payer: Self-pay

## 2024-02-28 ENCOUNTER — Encounter
Admission: RE | Admit: 2024-02-28 | Discharge: 2024-02-28 | Disposition: A | Discharge status: Home / Self Care | Source: Ambulatory Visit | Attending: Vascular Surgery | Admitting: Vascular Surgery

## 2024-02-28 DIAGNOSIS — Z01818 Encounter for other preprocedural examination: Secondary | ICD-10-CM | POA: Diagnosis present

## 2024-02-28 DIAGNOSIS — T82898A Other specified complication of vascular prosthetic devices, implants and grafts, initial encounter: Secondary | ICD-10-CM | POA: Insufficient documentation

## 2024-02-28 DIAGNOSIS — Y831 Surgical operation with implant of artificial internal device as the cause of abnormal reaction of the patient, or of later complication, without mention of misadventure at the time of the procedure: Secondary | ICD-10-CM | POA: Diagnosis not present

## 2024-02-28 HISTORY — DX: Personal history of Methicillin resistant Staphylococcus aureus infection: Z86.14

## 2024-02-28 HISTORY — DX: Type 2 diabetes mellitus without complications: E11.9

## 2024-02-28 HISTORY — DX: Obesity, class 1: E66.811

## 2024-02-28 HISTORY — DX: Other specified abnormal findings of blood chemistry: R79.89

## 2024-02-28 NOTE — Telephone Encounter (Signed)
 " Reason for conversation: Triage for Rash and Blister  Blisters on hands and feet that started on Monday 02/19/24 Pt states that blisters are peeling Appt made in clinic for the morning, but pt will call back to reschedule if it interferes with her dialysis appt Advised pt to go to [facility] in [place] today if unable to be seen in clinic in the morning due to dialysis  Protocol used: Blister - Foot and Hand-A-AH    [1] Blister on foot AND [2] diabetes mellitus or peripheral vascular disease (poor circulation)   Disposition: Disposition     See Physician Within 24 Hours       What would patient/caregiver have done without intervention? Made an Appointment   Patient/Caregiver understands and will follow disposition? Yes, will follow disposition       Actions Taken:  Triage completed; care advice given per protocol. Call back parameters given and caller advised of 24 hour nurse triage. Instructed to seek immediate medical attention if new symptoms develop, current symptoms worsen or if you become increasingly concerned. Patient verbalized understanding. Scheduled an appointment.    Patient Engagement Center Documentation Additional Information  Contacts     Contact Date/Time Type Contact Phone/Fax Modification Date/Time   02/28/24 10:42 EST Phone (Incoming) Khristie, Sak (Self) [phone] BENNIE)       Recent Office Visits     Date Provider Department Visit Type Primary Dx   02/16/2024 Zachary Gulling Sherita Edu, MD [facility] Office Visit Essential hypertension   10/12/2023 George, Sionne Sherita Edu, MD [facility] Office Visit Depression screening [phone])   07/20/2023 Zachary Gulling Sherita Edu, MD [facility] Office Visit Essential hypertension      Last video visit and provider: Visit date not found  Last telephone visit and provider: Visit date not found   Future Appointments     Date/Time  Provider Department Center Visit Type   02/29/2024 9:30 AM (Arrive by 9:15 AM) Eliverto Bette Hover, MD [facility] [place] SAME DAY   04/22/2024 3:00 PM Paraschos, Marsa, MD [facility] FOLLOW UP   08/15/2024 11:00 AM (Arrive by 10:45 AM) Zachary Gulling Sherita Edu, MD [facility] [place] [facility] OFFICE VISIT   10/16/2024 10:00 AM (Arrive by 9:45 AM) [facility] NURSE - [place] [facility] WELL[facility]         Reason for Disposition  [1] Blister on foot AND [2] diabetes mellitus or peripheral vascular disease (poor circulation)  Additional Information  Negative: Sounds like a life-threatening emergency to the triager  Negative: Followed a thermal burn (e.g., hot object, scald from boiling water)  Negative: Followed a chemical burn  Negative: Followed frostbite  Negative: Poison ivy, oak, or sumac contact suspected  Negative: Shingles suspected (i.e., painful rash, multiple small blisters grouped together in one area of body; dermatomal distribution)  Negative: [1] Diabetes mellitus AND [2] boil, ulcer, or infection of foot  Negative: Blisters on other parts of the body (besides hands and feet)  Negative: Patient sounds very sick or weak to the triager  Negative: [1] Looks infected (spreading redness, red streak, or pus) AND [2] fever  Negative: [1] Looks infected AND [2] large red area (> 2 in. or 5 cm)  Negative: [1] Foul-smelling odor from foot AND [2] new or increased  Negative: [1] Purple or black skin on foot or toe AND [2] new or increased  Negative:  Looks like a boil or deep ulcer  Negative: [1] Looks infected (e.g., red area, red streak, pus) AND [2] no fever  Answer Assessment - Initial Assessment Questions 1. APPEARANCE of BLISTER: What does it look like? small blisters 2. SIZE: How large is the blister? (inches, cm or compare to coins) vary in size 3. LOCATION: Where are the  blisters located?  hand and feet 4. WHEN: When did the blister happen? 02/19/24 Monday 5. CAUSE: What do you think caused the blister? unsure 6. PAIN: Does it hurt? If so, ask: How bad is the pain?  (Scale 1-10; or mild, moderate, severe) no pain 7. OTHER SYMPTOMS: Do you have any other symptoms? (e.g., fever) peeling  Protocols used: Blister - Foot and Hand-A-AH  "

## 2024-02-28 NOTE — Patient Instructions (Addendum)
 Your procedure is scheduled on:03-06-24 Wednesday Report to the Registration Desk on the 1st floor of the [facility].Then proceed to the 2nd floor Surgery Desk To find out your arrival time, please call [phone] between 1PM - 3PM on:03-05-24 Tuesday If your arrival time is 6:00 am, do not arrive before that time as the [facility] entrance doors do not open until 6:00 am.  REMEMBER: Instructions that are not followed completely may result in serious medical risk, up to and including death; or upon the discretion of your surgeon and anesthesiologist your surgery may need to be rescheduled.  Do not eat food OR drink liquids after midnight the night before surgery.  No gum chewing or hard candies.  One week prior to surgery:Stop NOW (02-28-24) Stop Anti-inflammatories (NSAIDS) such as Advil , Aleve, Ibuprofen , Motrin , Naproxen, Naprosyn and Aspirin based products such as Excedrin, Goody's Powder, BC Powder. Stop ANY OVER THE COUNTER supplements until after surgery (Folic Acid , VItamin B12)  You may however, continue to take Tylenol  if needed for pain up until the day of surgery.  Continue taking all of your other prescription medications up until the day of surgery.  ON THE DAY OF SURGERY ONLY TAKE THESE MEDICATIONS WITH SIPS OF WATER: -amLODipine  (NORVASC )  -carvedilol  (COREG )  -cloNIDine  (CATAPRES )   Do NOT take any Insulin  the morning of surgery  Bring your Albuterol  Inhaler to the hospital  No Alcohol for 24 hours before or after surgery.  No Smoking including e-cigarettes for 24 hours before surgery.  No chewable tobacco products for at least 6 hours before surgery.  No nicotine patches on the day of surgery.  Do not use any recreational drugs for at least a week (preferably 2 weeks) before your surgery.  Please be advised that the combination of cocaine and anesthesia may have negative outcomes, up to and including death. If you test positive for cocaine, your surgery  will be cancelled.  On the morning of surgery brush your teeth with toothpaste and water, you may rinse your mouth with mouthwash if you wish. Do not swallow any toothpaste or mouthwash.  Use CHG Soap as directed on instruction sheet.  Do not wear jewelry, make-up, hairpins, clips or nail polish.  For welded (permanent) jewelry: bracelets, anklets, waist bands, etc.  Please have this removed prior to surgery.  If it is not removed, there is a chance that hospital personnel will need to cut it off on the day of surgery.  Do not wear lotions, powders, or perfumes.   Do not shave body hair from the neck down 48 hours before surgery.  Contact lenses, hearing aids and dentures may not be worn into surgery.  Do not bring valuables to the hospital. [facility] is not responsible for any missing/lost belongings or valuables.   Notify your doctor if there is any change in your medical condition (cold, fever, infection).  Wear comfortable clothing (specific to your surgery type) to the hospital.  After surgery, you can help prevent lung complications by doing breathing exercises.  Take deep breaths and cough every 1-2 hours. Your doctor may order a device called an Incentive Spirometer to help you take deep breaths. When coughing or sneezing, hold a pillow firmly against your incision with both hands. This is called splinting. Doing this helps protect your incision. It also decreases belly discomfort.  If you are being admitted to the hospital overnight, leave your suitcase in the car. After surgery it may be brought to your room.  In case of increased patient census, it may be necessary for you, the patient, to continue your postoperative care in the Same Day Surgery department.  If you are being discharged the day of surgery, you will not be allowed to drive home. You will need a responsible individual to drive you home and stay with you for 24 hours after surgery.   If you are taking  public transportation, you will need to have a responsible individual with you.  Please call the Pre-admissions Testing Dept. at [phone] if you have any questions about these instructions.  Surgery Visitation Policy:  Patients having surgery or a procedure may have two visitors.  Children under the age of 42 must have an adult with them who is not the patient.                                                                                                             Preparing for Surgery with CHLORHEXIDINE  GLUCONATE (CHG) Soap  Chlorhexidine  Gluconate (CHG) Soap  o An antiseptic cleaner that kills germs and bonds with the skin to continue killing germs even after washing  o Used for showering the night before surgery and morning of surgery  Before surgery, you can play an important role by reducing the number of germs on your skin.  CHG (Chlorhexidine  gluconate) soap is an antiseptic cleanser which kills germs and bonds with the skin to continue killing germs even after washing.  Please do not use if you have an allergy to CHG or antibacterial soaps. If your skin becomes reddened/irritated stop using the CHG.  1. Shower the NIGHT BEFORE SURGERY with CHG soap.  2. If you choose to wash your hair, wash your hair first as usual with your normal shampoo.  3. After shampooing, rinse your hair and body thoroughly to remove the shampoo.  4. Use CHG as you would any other liquid soap. You can apply CHG directly to the skin and wash gently with a clean washcloth.  5. Apply the CHG soap to your body only from the neck down. Do not use on open wounds or open sores. Avoid contact with your eyes, ears, mouth, and genitals (private parts). Wash face and genitals (private parts) with your normal soap.  6. Wash thoroughly, paying special attention to the area where your surgery will be performed.  7. Thoroughly rinse your body with warm water.  8. Do not shower/wash with your normal  soap after using and rinsing off the CHG soap.  9. Do not use lotions, oils, etc., after showering with CHG.  10. Pat yourself dry with a clean towel.  11. Wear clean pajamas to bed the night before surgery.  12. Place clean sheets on your bed the night of your shower and do not sleep with pets.  13. Do not apply any deodorants/lotions/powders.  14. Please wear clean clothes to the hospital.  15. Remember to brush your teeth with your regular toothpaste.   Merchandiser, Retail to address health-related social needs:  [email or web address]

## 2024-02-29 NOTE — Progress Notes (Signed)
 " Chief Complaint  Patient presents with   Blister    Hands and feet.     Subjective  Heidi [name] is a 69 y.o. female who presents for Blister (Hands and feet. ) HPI History of Present Illness Heidi [name] is a 69 year old female who presents with a rash on her hands and feet.  She describes the rash as beginning with 'little spots' that have spread to her hands and feet, and notes it is 'spreading up here now.'  As the spots started to spread and noticed peeling off of the skin both on her hands as well as her feet.  She has not experienced this type of event in the past.  The patient reports that the rash initially presented with blistering.  No handling of chemicals such as Clorox or other cleaning agents. This is the first occurrence of such a rash for her, and she has no prior history of eczema.          Review of Systems  Patient Active Problem List  Diagnosis   Type 2 diabetes mellitus with ESRD (end-stage renal disease) (CMS/HHS-HCC)   Hypertension   Hyperlipidemia   Trochanteric bursitis of both hips   Pes planus   Allergic rhinitis   Microalbuminuria   Genu valgus, congenital   Atopic dermatitis   Hyponatremia   Tricompartment degenerative joint disease of knee   Chronic renal disease, stage 3, moderately decreased glomerular filtration rate between 30-59 mL/min/1.73 square meter (CMS-HCC)   Facet arthritis of lumbar region   Degenerative arthritis of lumbar spine   Breast mass in female   Secondary hyperparathyroidism of renal origin (HHS-HCC)   Essential hypertension   Primary osteoarthritis of left knee   Primary osteoarthritis of right knee   Obesity (BMI 35.0-39.9 without comorbidity), unspecified   CKD (chronic kidney disease), stage V (CMS/HHS-HCC)   Fracture of manubrium, initial encounter for closed fracture   Hyperglycemia   Breast hematoma   Type I or II open bimalleolar fracture of right ankle   Open displaced  fracture of body of right talus   ESRD on dialysis (CMS/HHS-HCC)   Anemia in end-stage renal disease (CMS/HHS-HCC)   Vitamin D  insufficiency   Insomnia   Red blood cell antibody positive   Preop cardiovascular exam   Dependence on renal dialysis ()   Obesity   Hyperlipidemia, unspecified   ESRD needing dialysis (CMS/HHS-HCC)   End stage renal disease (CMS/HHS-HCC)   Type 2 diabetes mellitus with diabetic chronic kidney disease (CMS/HHS-HCC)   Hypertensive chronic kidney disease with stage 5 chronic kidney disease or end stage renal disease (CMS/HHS-HCC)   Anemia in chronic kidney disease   Ventral hernia without obstruction or gangrene   Unspecified chronic bronchitis (CMS/HHS-HCC)   Peritoneal dialysis catheter in situ ()   Long term (current) use of insulin  (CMS/HHS-HCC)   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 (HHS-HCC)   Polycystic kidney disease   Pericardial effusion (HHS-HCC)   Anemia of chronic kidney failure, stage 5 (CMS/HHS-HCC)   Dyslipidemia    Outpatient Medications Prior to Visit  Medication Sig Dispense Refill   albuterol  MDI, PROVENTIL , VENTOLIN , PROAIR , HFA 90 mcg/actuation inhaler INHALE 2 PUFFS EVERY 4 HOURS AS NEEDED FOR WHEEZING 3 each 3   amLODIPine  (NORVASC ) 10 MG tablet Take 1 tablet (10 mg total) by mouth once daily 100 tablet 3   b complex multivitamin (RENAL VITAMIN) 0.8 mg Take by mouth     blood glucose diagnostic (ACCU-CHEK GUIDE TEST STRIPS)  test strip USE AS DIRECTED. 3 TIMES DAILY 100 each 11   blood glucose diagnostic test strip 1 each (1 strip total) 3 (three) times daily Use as instructed. 100 each 11   calcitRIOL  (ROCALTROL ) 0.25 MCG capsule Take 1 capsule (0.25 mcg total) by mouth once daily 100 capsule 3   carvedilol  (COREG ) 25 MG tablet Take 1 tablet (25 mg total) by mouth 2 (two) times daily with meals 200 tablet 3   cloNIDine  HCL (CATAPRES ) 0.1 MG tablet TAKE 1 TABLET TWICE DAILY 200 tablet 1    cyanocobalamin  (VITAMIN B12) 1000 MCG tablet Take 1,000 mcg by mouth once daily     ergocalciferol , vitamin D2, 1,250 mcg (50,000 unit) capsule Take 1 capsule (50,000 Units total) by mouth once a week 12 capsule 3   fluticasone  propionate (FLONASE ) 50 mcg/actuation nasal spray Place 2 sprays into both nostrils once daily 48 g 3   folic acid  (FOLVITE ) 1 MG tablet Take 1 mg by mouth once daily     FUROsemide  (LASIX ) 80 MG tablet Take 80 mg by mouth once daily     hydrALAZINE  (APRESOLINE ) 25 MG tablet Take 1 tablet (25 mg total) by mouth 2 (two) times daily 200 tablet 3   insulin  NPH-REGULAR (HUMULIN 70/30 U-100 INSULIN ) 100 unit/mL (70-30) injection Take 20 units sq before breakfast and 15 units sq in before dinner 20 mL 10   insulin  syringe-needle U-100 (TRUEPLUS INSULIN ) 0.3 mL 31 gauge x 5/16 syringe Use 1 Syringe 2 (two) times daily 100 each 1   losartan  (COZAAR ) 100 MG tablet Take 1 tablet (100 mg total) by mouth once daily 100 tablet 3   lovastatin (MEVACOR) 20 MG tablet Take 1 tablet (20 mg total) by mouth daily with dinner 100 tablet 3   ONETOUCH DELICA PLUS LANCET      blood glucose meter kit as directed 1 each 0   blood glucose meter kit as directed As directed accuchek glide 1 each 0   lancets Use 1 each 3 (three) times daily Use as instructed. 100 each 10   lancing device with lancets kit Use 1 each 3 (three) times daily 100 each 12   No facility-administered medications prior to visit.      Objective  Vitals:   02/29/24 0926  BP: (!) 145/69  Pulse: 92  SpO2: 100%  Weight: 81.2 kg (179 lb)  PainSc:   6  PainLoc: Knee   Body mass index is 32.74 kg/m.  Home Vitals:     Physical Exam Physical Exam SKIN: Erythematous rash with peeling on feet.  Constitutional: alert, obese, in NAD, and communicates well Eye exam: sclera anicteric. Neck: supple Respiratory: clear to auscultation, without rales or wheezes  Cardiovascular: regular rate and  rhythm Lower extremities: no lower extremity edema Skin: Palm of the hands as well as portions of the fingers with peeling skin, same thing with her feet. Neurological: sensorimotor grossly intact and normal muscle tone  Results       Assessment/Plan:   Assessment & Plan  Dyshidrotic eczema I suspect this is dyshidrotic eczema.  Hands and feet, characterized by blistering and peeling of the skin. Likely triggered by dryness or a harsh chemical, though the exact cause is unknown. No prior history of eczema. Condition is acute and localized to the hands and feet. - Prescribed triamcinolone topical steroid, apply twice daily. - Apply Vaseline or petrolatum to seal the area after steroid application. - Provided two tubes of triamcinolone with an extra refill. -  Advised against using Vaseline on the face.  General Health Maintenance A1c levels are well-controlled. Upcoming eye exam for diabetes management. Recommended vaccinations include shingles and respiratory syncytial virus vaccines. - Ensure eye exam for diabetes management is scheduled. - Administer shingles vaccine. - Administer respiratory syncytial virus vaccine.  Diagnoses and all orders for this visit:  Dyshidrotic eczema -     triamcinolone 0.5 % cream; Apply topically 2 (two) times daily  Chronic bronchitis, unspecified chronic bronchitis type (CMS/HHS-HCC) Symptoms are stable.  Followed by a specialist.     This visit was coded based on medical decision making (MDM).           Future Appointments     Date/Time Provider Department Center Visit Type   04/22/2024 3:00 PM Paraschos, Marsa, MD [facility] C FOLLOW UP   08/15/2024 11:00 AM (Arrive by 10:45 AM) Zachary Idelia Sherita Jock, MD [facility] [place] [facility] OFFICE VISIT   10/16/2024 10:00 AM (Arrive by 9:45 AM) POPULATION HEALTH NURSE - [place] [facility] [place] [facility]       There are no Patient  Instructions on file for this visit.  An after visit summary was provided for the patient either in written format (printed) or through [facility].  This note has been created using automated tools and reviewed for accuracy by MARIO E OLMEDO.  "

## 2024-03-05 LAB — TYPE AND SCREEN
ABO/RH(D): O POS
Antibody Screen: POSITIVE
DAT, IgG: NEGATIVE

## 2024-03-05 MED ORDER — CHLORHEXIDINE GLUCONATE CLOTH 2 % EX PADS: 6.0000 | MEDICATED_PAD | Freq: Once | CUTANEOUS | Status: DC

## 2024-03-05 MED ORDER — SODIUM CHLORIDE 0.9 % IV SOLN: INTRAVENOUS | Status: DC

## 2024-03-05 MED ORDER — CHLORHEXIDINE GLUCONATE CLOTH 2 % EX PADS
6.0000 | MEDICATED_PAD | Freq: Once | CUTANEOUS | Status: AC
  Administered 2024-03-06: 6 via TOPICAL

## 2024-03-05 MED ORDER — CHLORHEXIDINE GLUCONATE 0.12 % MT SOLN
15.0000 mL | Freq: Once | OROMUCOSAL | Status: AC
  Administered 2024-03-06: 15 mL via OROMUCOSAL

## 2024-03-05 MED ORDER — ORAL CARE MOUTH RINSE: 15.0000 mL | Freq: Once | OROMUCOSAL | Status: AC

## 2024-03-05 MED ORDER — CEFAZOLIN SODIUM-DEXTROSE 2-4 GM/100ML-% IV SOLN: 2.0000 g | INTRAVENOUS | Status: DC

## 2024-03-06 ENCOUNTER — Other Ambulatory Visit: Payer: Self-pay

## 2024-03-06 ENCOUNTER — Ambulatory Visit: Payer: Self-pay | Admitting: Urgent Care

## 2024-03-06 ENCOUNTER — Encounter
Admission: RE | Disposition: A | Discharge status: Home / Self Care | Payer: Self-pay | Source: Ambulatory Visit | Attending: Vascular Surgery

## 2024-03-06 ENCOUNTER — Ambulatory Visit
Admission: RE | Admit: 2024-03-06 | Discharge: 2024-03-06 | Disposition: A | Discharge status: Home / Self Care | Source: Ambulatory Visit | Attending: Vascular Surgery | Admitting: Vascular Surgery

## 2024-03-06 ENCOUNTER — Encounter: Payer: Self-pay | Admitting: Vascular Surgery

## 2024-03-06 DIAGNOSIS — J45909 Unspecified asthma, uncomplicated: Secondary | ICD-10-CM | POA: Insufficient documentation

## 2024-03-06 DIAGNOSIS — Z992 Dependence on renal dialysis: Secondary | ICD-10-CM | POA: Insufficient documentation

## 2024-03-06 DIAGNOSIS — E1122 Type 2 diabetes mellitus with diabetic chronic kidney disease: Secondary | ICD-10-CM | POA: Insufficient documentation

## 2024-03-06 DIAGNOSIS — K219 Gastro-esophageal reflux disease without esophagitis: Secondary | ICD-10-CM | POA: Diagnosis not present

## 2024-03-06 DIAGNOSIS — N186 End stage renal disease: Secondary | ICD-10-CM | POA: Diagnosis present

## 2024-03-06 DIAGNOSIS — E785 Hyperlipidemia, unspecified: Secondary | ICD-10-CM | POA: Diagnosis not present

## 2024-03-06 DIAGNOSIS — G4730 Sleep apnea, unspecified: Secondary | ICD-10-CM | POA: Insufficient documentation

## 2024-03-06 DIAGNOSIS — Z794 Long term (current) use of insulin: Secondary | ICD-10-CM | POA: Diagnosis not present

## 2024-03-06 DIAGNOSIS — Y838 Other surgical procedures as the cause of abnormal reaction of the patient, or of later complication, without mention of misadventure at the time of the procedure: Secondary | ICD-10-CM | POA: Insufficient documentation

## 2024-03-06 DIAGNOSIS — I120 Hypertensive chronic kidney disease with stage 5 chronic kidney disease or end stage renal disease: Secondary | ICD-10-CM | POA: Insufficient documentation

## 2024-03-06 DIAGNOSIS — T82898A Other specified complication of vascular prosthetic devices, implants and grafts, initial encounter: Secondary | ICD-10-CM | POA: Diagnosis not present

## 2024-03-06 LAB — GLUCOSE, CAPILLARY
Glucose-Capillary: 105 mg/dL — ABNORMAL HIGH (ref 70–99)
Glucose-Capillary: 166 mg/dL — ABNORMAL HIGH (ref 70–99)

## 2024-03-06 MED ORDER — EPHEDRINE 5 MG/ML INJ
INTRAVENOUS | Status: AC
  Filled 2024-03-06: qty 5

## 2024-03-06 MED ORDER — ALBUTEROL SULFATE HFA 108 (90 BASE) MCG/ACT IN AERS
INHALATION_SPRAY | RESPIRATORY_TRACT | Status: DC | PRN
  Administered 2024-03-06: 2 via RESPIRATORY_TRACT

## 2024-03-06 MED ORDER — PHENYLEPHRINE 80 MCG/ML (10ML) SYRINGE FOR IV PUSH (FOR BLOOD PRESSURE SUPPORT)
PREFILLED_SYRINGE | INTRAVENOUS | Status: DC | PRN
  Administered 2024-03-06: 40 ug via INTRAVENOUS
  Administered 2024-03-06 (×2): 80 ug via INTRAVENOUS

## 2024-03-06 MED ORDER — PHENYLEPHRINE 80 MCG/ML (10ML) SYRINGE FOR IV PUSH (FOR BLOOD PRESSURE SUPPORT)
PREFILLED_SYRINGE | INTRAVENOUS | Status: AC
  Filled 2024-03-06: qty 10

## 2024-03-06 MED ORDER — MIDAZOLAM HCL 2 MG/2ML IJ SOLN
INTRAMUSCULAR | Status: AC
  Filled 2024-03-06: qty 2

## 2024-03-06 MED ORDER — HYDROMORPHONE HCL 1 MG/ML IJ SOLN: 1.0000 mg | Freq: Once | INTRAMUSCULAR | Status: DC | PRN

## 2024-03-06 MED ORDER — FENTANYL CITRATE (PF) 100 MCG/2ML IJ SOLN
INTRAMUSCULAR | Status: DC | PRN
  Administered 2024-03-06: 50 ug via INTRAVENOUS

## 2024-03-06 MED ORDER — DEXAMETHASONE SOD PHOSPHATE PF 10 MG/ML IJ SOLN
INTRAMUSCULAR | Status: DC | PRN
  Administered 2024-03-06: 4 mg via INTRAVENOUS

## 2024-03-06 MED ORDER — DEXAMETHASONE SOD PHOSPHATE PF 10 MG/ML IJ SOLN
INTRAMUSCULAR | Status: AC
  Filled 2024-03-06: qty 1

## 2024-03-06 MED ORDER — PHENYLEPHRINE HCL-NACL 20-0.9 MG/250ML-% IV SOLN
INTRAVENOUS | Status: AC
  Filled 2024-03-06: qty 250

## 2024-03-06 MED ORDER — LIDOCAINE HCL (PF) 2 % IJ SOLN
INTRAMUSCULAR | Status: AC
  Filled 2024-03-06: qty 5

## 2024-03-06 MED ORDER — ONDANSETRON HCL 4 MG/2ML IJ SOLN: 4.0000 mg | Freq: Four times a day (QID) | INTRAMUSCULAR | Status: DC | PRN

## 2024-03-06 MED ORDER — CEFAZOLIN SODIUM-DEXTROSE 2-3 GM-%(50ML) IV SOLR
INTRAVENOUS | Status: DC | PRN
  Administered 2024-03-06: 2 g via INTRAVENOUS

## 2024-03-06 MED ORDER — ONDANSETRON HCL 4 MG/2ML IJ SOLN: 4.0000 mg | Freq: Once | INTRAMUSCULAR | Status: DC | PRN

## 2024-03-06 MED ORDER — LIDOCAINE HCL (CARDIAC) PF 100 MG/5ML IV SOSY
PREFILLED_SYRINGE | INTRAVENOUS | Status: DC | PRN
  Administered 2024-03-06: 100 mg via INTRAVENOUS

## 2024-03-06 MED ORDER — ONDANSETRON HCL 4 MG/2ML IJ SOLN
INTRAMUSCULAR | Status: AC
  Filled 2024-03-06: qty 2

## 2024-03-06 MED ORDER — VASOPRESSIN 20 UNIT/ML IV SOLN
INTRAVENOUS | Status: DC | PRN
  Administered 2024-03-06 (×2): 2 [IU] via INTRAVENOUS

## 2024-03-06 MED ORDER — EPHEDRINE SULFATE-NACL 50-0.9 MG/10ML-% IV SOSY
PREFILLED_SYRINGE | INTRAVENOUS | Status: DC | PRN
  Administered 2024-03-06: 5 mg via INTRAVENOUS
  Administered 2024-03-06: 7.5 mg via INTRAVENOUS
  Administered 2024-03-06: 5 mg via INTRAVENOUS

## 2024-03-06 MED ORDER — OXYCODONE HCL 5 MG/5ML PO SOLN: 5.0000 mg | Freq: Once | ORAL | Status: AC | PRN

## 2024-03-06 MED ORDER — OXYCODONE HCL 5 MG PO TABS
5.0000 mg | ORAL_TABLET | Freq: Once | ORAL | Status: AC | PRN
  Administered 2024-03-06: 5 mg via ORAL

## 2024-03-06 MED ORDER — LACTATED RINGERS IV SOLN: INTRAVENOUS | Status: DC | PRN

## 2024-03-06 MED ORDER — ALBUTEROL SULFATE HFA 108 (90 BASE) MCG/ACT IN AERS
INHALATION_SPRAY | RESPIRATORY_TRACT | Status: AC
  Filled 2024-03-06: qty 6.7

## 2024-03-06 MED ORDER — ROCURONIUM BROMIDE 10 MG/ML (PF) SYRINGE
PREFILLED_SYRINGE | INTRAVENOUS | Status: AC
  Filled 2024-03-06: qty 10

## 2024-03-06 MED ORDER — PROPOFOL 10 MG/ML IV BOLUS
INTRAVENOUS | Status: AC
  Filled 2024-03-06: qty 20

## 2024-03-06 MED ORDER — PROPOFOL 10 MG/ML IV BOLUS
INTRAVENOUS | Status: DC | PRN
  Administered 2024-03-06: 170 mg via INTRAVENOUS

## 2024-03-06 MED ORDER — TRAMADOL HCL 50 MG PO TABS: 50.0000 mg | ORAL_TABLET | Freq: Four times a day (QID) | ORAL | 0 refills | Status: AC | PRN

## 2024-03-06 MED ORDER — FENTANYL CITRATE (PF) 100 MCG/2ML IJ SOLN
25.0000 ug | INTRAMUSCULAR | Status: DC | PRN
  Administered 2024-03-06 (×3): 25 ug via INTRAVENOUS

## 2024-03-06 MED ORDER — SODIUM CHLORIDE 0.9 % IV SOLN
INTRAVENOUS | Status: DC | PRN
  Administered 2024-03-06: 500 mL via SURGICAL_CAVITY

## 2024-03-06 MED ORDER — OXYCODONE HCL 5 MG PO TABS
ORAL_TABLET | ORAL | Status: AC
  Filled 2024-03-06: qty 1

## 2024-03-06 MED ORDER — MIDAZOLAM HCL (PF) 2 MG/2ML IJ SOLN
INTRAMUSCULAR | Status: DC | PRN
  Administered 2024-03-06 (×2): 1 mg via INTRAVENOUS

## 2024-03-06 MED ORDER — FENTANYL CITRATE (PF) 100 MCG/2ML IJ SOLN
INTRAMUSCULAR | Status: AC
  Filled 2024-03-06: qty 2

## 2024-03-06 MED ORDER — CEFAZOLIN SODIUM-DEXTROSE 2-4 GM/100ML-% IV SOLN
INTRAVENOUS | Status: AC
  Filled 2024-03-06: qty 100

## 2024-03-06 MED ORDER — SUCCINYLCHOLINE CHLORIDE 200 MG/10ML IV SOSY
PREFILLED_SYRINGE | INTRAVENOUS | Status: AC
  Filled 2024-03-06: qty 10

## 2024-03-06 MED ORDER — CHLORHEXIDINE GLUCONATE 0.12 % MT SOLN
OROMUCOSAL | Status: AC
  Filled 2024-03-06: qty 15

## 2024-03-06 MED ORDER — ONDANSETRON HCL 4 MG/2ML IJ SOLN
INTRAMUSCULAR | Status: DC | PRN
  Administered 2024-03-06: 4 mg via INTRAVENOUS

## 2024-03-06 NOTE — Op Note (Signed)
 [facility]   OPERATIVE NOTE  DATE: 03/06/2024  PRE-OPERATIVE DIAGNOSIS: End-stage renal disease, steal syndrome left arm  POST-OPERATIVE DIAGNOSIS: same as above  PROCEDURE: 1.   Banding of left brachiocephalic AVF  SURGEON: Selinda Gu, MD  ASSISTANT(S): None  ANESTHESIA: General  ESTIMATED BLOOD LOSS: minimal  FINDING(S): 1.  none  SPECIMEN(S):  None  INDICATIONS:   Patient is a 69 y.o.female who presents with left hand pain and numbness with evidence of steal syndrome and a large patent left brachiocephalic AV fistula. Risks and benefits were discussed and the patient was agreeable to proceed.  DESCRIPTION: After obtaining full informed written consent, the patient was brought back to the operating room and placed supine upon the operating table.  The patient received IV antibiotics prior to induction.  After obtaining adequate anesthesia, the patient was prepped and draped in the standard fashion. I created a small transverse incision in the mid to distal upper arm overlying the AVF.  The fistula was dissected out.  In that area, 2-0 silk ties were used to band the AVF and reduce its size to about 3-4 mm.  There was still a thrill present proximal to the banding, but the fistula was clearly narrowed and the flow reduced.  There was now a radial pulse distally at the wrist as well.  The wound was then irrigated and closed with a 3-0 Vicryl and a 4-0 Monocryl. Dermabond was placed as a dressing. The patient was taken to the recovery room in stable condition having tolerated the procedure well.  COMPLICATIONS: None  CONDITION: Stable   Selinda Gu 03/06/2024 11:01 AM  This note was created with Dragon Medical transcription system. Any errors in dictation are purely unintentional.

## 2024-03-06 NOTE — Anesthesia Procedure Notes (Signed)
 Procedure Name: LMA Insertion Date/Time: 03/06/2024 10:28 AM  Performed by: Trudy Rankin LABOR, CRNAPre-anesthesia Checklist: Patient identified, Emergency Drugs available, Suction available, Patient being monitored and Timeout performed Patient Re-evaluated:Patient Re-evaluated prior to induction Oxygen Delivery Method: Circle system utilized Preoxygenation: Pre-oxygenation with 100% oxygen Induction Type: IV induction LMA: LMA inserted and LMA with gastric port inserted LMA Size: 4.0 Number of attempts: 1 Placement Confirmation: positive ETCO2 and breath sounds checked- equal and bilateral Tube secured with: Tape Dental Injury: Teeth and Oropharynx as per pre-operative assessment  Comments: Atraumatic LMA placement

## 2024-03-06 NOTE — Transfer of Care (Signed)
 Immediate Anesthesia Transfer of Care Note  Patient: Heidi [name]  Procedure(s) Performed: LIGATION OF ARTERIOVENOUS  FISTULA (Left)  Patient Location: PACU  Anesthesia Type:General  Level of Consciousness: awake, drowsy, and patient cooperative  Airway & Oxygen Therapy: Patient Spontanous Breathing and Patient connected to face mask oxygen  Post-op Assessment: Report given to RN, Post -op Vital signs reviewed and stable, and Patient moving all extremities  Post vital signs: Reviewed and stable  Last Vitals:  Vitals Value Taken Time  BP 124/73 03/06/24 11:10  Temp 36.1 C 03/06/24 11:10  Pulse 86 03/06/24 11:14  Resp 11 03/06/24 11:14  SpO2 100 % 03/06/24 11:14  Vitals shown include unfiled device data.  Last Pain:  Vitals:   03/06/24 0743  TempSrc: Temporal  PainSc: 0-No pain         Complications: No notable events documented.

## 2024-03-06 NOTE — Interval H&P Note (Signed)
 History and Physical Interval Note:  03/06/2024 7:43 AM  Heidi [name]  has presented today for surgery, with the diagnosis of ESRD.  The various methods of treatment have been discussed with the patient and family. After consideration of risks, benefits and other options for treatment, the patient has consented to Banding/Ligation of left arm AV fistula as a surgical intervention.  The patient's history has been reviewed, patient examined, no change in status, stable for surgery.  I have reviewed the patient's chart and labs.  Questions were answered to the patient's satisfaction.     Imane Burrough

## 2024-03-06 NOTE — Anesthesia Postprocedure Evaluation (Signed)
"   Anesthesia Post Note  Patient: Heidi [name]  Procedure(s) Performed: LIGATION OF ARTERIOVENOUS  FISTULA (Left)  Patient location during evaluation: Phase II Anesthesia Type: General Level of consciousness: awake and alert and oriented Pain management: pain level controlled Vital Signs Assessment: post-procedure vital signs reviewed and stable Respiratory status: spontaneous breathing Cardiovascular status: stable Postop Assessment: no apparent nausea or vomiting Anesthetic complications: no   No notable events documented.   Last Vitals:  Vitals:   03/06/24 1130 03/06/24 1138  BP: 123/68   Pulse: 88 83  Resp: 14 12  Temp:    SpO2: 98% 98%    Last Pain:  Vitals:   03/06/24 1147  TempSrc:   PainSc: 10-Worst pain ever                 Emani Morad GORMAN Balloon      "

## 2024-03-06 NOTE — Anesthesia Preprocedure Evaluation (Addendum)
 "                                  Anesthesia Evaluation  Patient identified by MRN, date of birth, ID band Patient awake    Reviewed: Allergy & Precautions, NPO status , Patient's Chart, lab work & pertinent test results, reviewed documented beta blocker date and time   Airway Mallampati: III  TM Distance: >3 FB    Comment: Receeding chin, large overbite Dental no notable dental hx.    Pulmonary asthma , sleep apnea , pneumonia, resolved   Pulmonary exam normal        Cardiovascular Exercise Tolerance: Good hypertension, Normal cardiovascular exam+ Valvular Problems/Murmurs   EKG Normal sinus rhythm Right atrial enlargement Inferior infarct , age undetermined Abnormal ECG When compared with ECG of 30-Jan-2023 13:16  Echo: 01/2023  1. Left ventricular ejection fraction, by estimation, is 60 to 65%. The  left ventricle has normal function. The left ventricle has no regional  wall motion abnormalities. There is moderate left ventricular hypertrophy.  Left ventricular diastolic  parameters are consistent with Grade II diastolic dysfunction  (pseudonormalization).   2. Right ventricular systolic function is normal. The right ventricular  size is normal.   3. Left atrial size was mildly dilated.   4. Moderate pericardial effusion. The pericardial effusion is  circumferential. There is no evidence of cardiac tamponade. Moderate  pleural effusion in the left lateral region.   5. The mitral valve is normal in structure. Mild mitral valve  regurgitation. Severe mitral annular calcification.   6. The aortic valve is tricuspid. Aortic valve regurgitation is not  visualized.   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, congenital GERD (gastroesophageal reflux disease) Hyperlipidemia Hyponatremia Microalbuminuria Pes planus Tricompartment degenerative joint disease of knee Trochanteric bursitis of both hips Volvulus (HCC) Anemia of chronic kidney failure, stage 5 (HCC) Peritoneal dialysis catheter in situ Heart murmur Pneumonia  Sleep apnea Morbid obesity (HCC)  Dialysis patient ESRD (end stage renal disease) (HCC) Red blood cell antibody positive Vitamin D  insufficiency  Sepsis due to gram-negative UTI (HCC) Secondary hyperparathyroidism of renal origin  Pericardial effusion Renal insufficiency  History of MRSA infection Elevated parathyroid hormone  DM (diabetes mellitus), type 2 (HCC) Obesity (BMI 30.0-34.9)      Reproductive/Obstetrics                              Anesthesia Physical Anesthesia Plan  ASA: 4  Anesthesia Plan: General   Post-op Pain Management:    Induction: Intravenous  PONV Risk Score and Plan:   Airway Management Planned: LMA  Additional Equipment:   Intra-op Plan:   Post-operative Plan: Extubation in OR  Informed Consent: I have reviewed the patients History and Physical, chart, labs and discussed the procedure including the risks, benefits and alternatives for the proposed anesthesia with the patient or authorized representative who has indicated his/her understanding and acceptance.       Plan Discussed with: CRNA  Anesthesia Plan Comments:  Anesthesia Quick Evaluation  "

## 2024-03-07 ENCOUNTER — Encounter: Payer: Self-pay | Admitting: Vascular Surgery

## 2024-03-08 ENCOUNTER — Other Ambulatory Visit: Payer: Self-pay | Admitting: Family Medicine

## 2024-03-08 DIAGNOSIS — M5412 Radiculopathy, cervical region: Secondary | ICD-10-CM

## 2024-03-11 ENCOUNTER — Inpatient Hospital Stay

## 2024-03-11 ENCOUNTER — Telehealth: Payer: Self-pay | Admitting: Oncology

## 2024-03-11 NOTE — Telephone Encounter (Signed)
 Called pt to get her labs rescheduled from 2/2 because we are closed. We got her rescheduled for 2/3 after she finishes dialysis. Pt agreed to this changed

## 2024-03-12 ENCOUNTER — Inpatient Hospital Stay

## 2024-03-12 ENCOUNTER — Telehealth: Payer: Self-pay

## 2024-03-12 ENCOUNTER — Inpatient Hospital Stay: Attending: Internal Medicine

## 2024-03-12 DIAGNOSIS — N189 Chronic kidney disease, unspecified: Secondary | ICD-10-CM

## 2024-03-12 LAB — SAMPLE TO BLOOD BANK

## 2024-03-12 LAB — HEMOGLOBIN AND HEMATOCRIT (CANCER CENTER ONLY)
HCT: 26.7 % — ABNORMAL LOW (ref 36.0–46.0)
Hemoglobin: 8.5 g/dL — ABNORMAL LOW (ref 12.0–15.0)

## 2024-03-12 NOTE — Telephone Encounter (Signed)
 hgb 8.5, hct 26.7, please advise regarding possible blood transfusion tomorrow.  Per Dr. Melanee no blood.  Outbound call; spoke to patient and informed of above.  Patient verbalized understanding.

## 2024-03-13 ENCOUNTER — Inpatient Hospital Stay

## 2024-03-16 ENCOUNTER — Ambulatory Visit: Admission: RE | Admit: 2024-03-16

## 2024-03-29 ENCOUNTER — Ambulatory Visit (INDEPENDENT_AMBULATORY_CARE_PROVIDER_SITE_OTHER): Admitting: Nurse Practitioner

## 2024-04-08 ENCOUNTER — Inpatient Hospital Stay: Admitting: Oncology

## 2024-04-08 ENCOUNTER — Inpatient Hospital Stay

## 2024-04-10 ENCOUNTER — Inpatient Hospital Stay
